# Patient Record
Sex: Female | Born: 1980 | Race: White | Hispanic: No | Smoking: Former smoker
Health system: Southern US, Community
[De-identification: ages and names within clinical notes are randomized; demographics above are authoritative.]

## PROBLEM LIST (undated history)

## (undated) DIAGNOSIS — D219 Benign neoplasm of connective and other soft tissue, unspecified: Secondary | ICD-10-CM

## (undated) DIAGNOSIS — K5900 Constipation, unspecified: Secondary | ICD-10-CM

## (undated) DIAGNOSIS — K439 Ventral hernia without obstruction or gangrene: Secondary | ICD-10-CM

## (undated) DIAGNOSIS — M2550 Pain in unspecified joint: Secondary | ICD-10-CM

## (undated) DIAGNOSIS — M549 Dorsalgia, unspecified: Secondary | ICD-10-CM

## (undated) DIAGNOSIS — G932 Benign intracranial hypertension: Secondary | ICD-10-CM

## (undated) DIAGNOSIS — M7989 Other specified soft tissue disorders: Secondary | ICD-10-CM

## (undated) DIAGNOSIS — F329 Major depressive disorder, single episode, unspecified: Secondary | ICD-10-CM

## (undated) DIAGNOSIS — E785 Hyperlipidemia, unspecified: Secondary | ICD-10-CM

## (undated) DIAGNOSIS — N809 Endometriosis, unspecified: Secondary | ICD-10-CM

## (undated) DIAGNOSIS — E559 Vitamin D deficiency, unspecified: Secondary | ICD-10-CM

## (undated) DIAGNOSIS — F32A Depression, unspecified: Secondary | ICD-10-CM

## (undated) DIAGNOSIS — E039 Hypothyroidism, unspecified: Secondary | ICD-10-CM

## (undated) DIAGNOSIS — T7840XA Allergy, unspecified, initial encounter: Secondary | ICD-10-CM

## (undated) DIAGNOSIS — I10 Essential (primary) hypertension: Secondary | ICD-10-CM

## (undated) DIAGNOSIS — F419 Anxiety disorder, unspecified: Secondary | ICD-10-CM

## (undated) DIAGNOSIS — R5383 Other fatigue: Secondary | ICD-10-CM

## (undated) DIAGNOSIS — R0602 Shortness of breath: Secondary | ICD-10-CM

## (undated) DIAGNOSIS — R748 Abnormal levels of other serum enzymes: Secondary | ICD-10-CM

## (undated) DIAGNOSIS — E038 Other specified hypothyroidism: Secondary | ICD-10-CM

## (undated) HISTORY — DX: Allergy, unspecified, initial encounter: T78.40XA

## (undated) HISTORY — DX: Constipation, unspecified: K59.00

## (undated) HISTORY — DX: Other specified hypothyroidism: E03.8

## (undated) HISTORY — DX: Other specified soft tissue disorders: M79.89

## (undated) HISTORY — DX: Anxiety disorder, unspecified: F41.9

## (undated) HISTORY — DX: Other fatigue: R53.83

## (undated) HISTORY — DX: Shortness of breath: R06.02

## (undated) HISTORY — DX: Dorsalgia, unspecified: M54.9

## (undated) HISTORY — PX: PARTIAL HYSTERECTOMY: SHX80

## (undated) HISTORY — DX: Ventral hernia without obstruction or gangrene: K43.9

## (undated) HISTORY — DX: Benign intracranial hypertension: G93.2

## (undated) HISTORY — DX: Abnormal levels of other serum enzymes: R74.8

## (undated) HISTORY — DX: Vitamin D deficiency, unspecified: E55.9

## (undated) HISTORY — DX: Endometriosis, unspecified: N80.9

## (undated) HISTORY — DX: Depression, unspecified: F32.A

## (undated) HISTORY — DX: Hypothyroidism, unspecified: E03.9

## (undated) HISTORY — DX: Pain in unspecified joint: M25.50

## (undated) HISTORY — DX: Hyperlipidemia, unspecified: E78.5

## (undated) HISTORY — DX: Essential (primary) hypertension: I10

## (undated) HISTORY — PX: ABLATION ON ENDOMETRIOSIS: SHX5787

## (undated) HISTORY — PX: ROBOTIC ASSISTED LAPAROSCOPIC PARTIAL CYSTECTOMY: SHX6480

---
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
## 2014-01-22 DIAGNOSIS — I10 Essential (primary) hypertension: Secondary | ICD-10-CM

## 2014-01-22 HISTORY — DX: Essential (primary) hypertension: I10

## 2016-01-14 DIAGNOSIS — J309 Allergic rhinitis, unspecified: Secondary | ICD-10-CM | POA: Insufficient documentation

## 2017-05-22 ENCOUNTER — Other Ambulatory Visit: Payer: Self-pay

## 2017-05-22 ENCOUNTER — Encounter: Payer: Self-pay | Admitting: Neurology

## 2017-05-22 ENCOUNTER — Ambulatory Visit (INDEPENDENT_AMBULATORY_CARE_PROVIDER_SITE_OTHER): Payer: 59 | Admitting: Neurology

## 2017-05-22 DIAGNOSIS — G932 Benign intracranial hypertension: Secondary | ICD-10-CM

## 2017-05-22 HISTORY — DX: Benign intracranial hypertension: G93.2

## 2017-05-22 MED ORDER — ACETAZOLAMIDE 250 MG PO TABS: 250.0000 mg | ORAL_TABLET | Freq: Every day | ORAL | Status: DC

## 2017-05-22 NOTE — Progress Notes (Signed)
Reason for visit: Pseudotumor cerebri  Referring physician: Dr. Chevis Pretty Erika Garrett is a 37 y.o. female  History of present illness:  Erika Garrett is a 37 year old left-handed white female with a history of obesity and pseudotumor cerebri.  Erika Garrett lives in [place], Erika diagnosis was made 3 years ago.  Erika Garrett presented with a pressure sensation in Erika head and visual loss primarily in Erika left eye.  Erika Garrett went to an ophthalmologist and was immediately sent to Erika emergency room.  MRI of Erika brain was done and she was started on Diamox taking 250 mg 4 times daily.  Erika Garrett underwent a spinal tap several weeks or months later, she had been taken off of birth control pills prior to Erika spinal tap.  Erika lumbar puncture according to Erika Garrett was unremarkable but Erika opening pressure was elevated.  Erika Garrett has been followed through ophthalmology, she has had good resolution of Erika pressure sensations in Erika head, her vision has improved.  Erika Garrett has been able to taper off of Erika Diamox taking 250 mg daily currently.  Erika Garrett had reported some tingling sensations in Erika hands and feet on Erika medication, she has not noted any other numbness, she denies any weakness.  She is bothered with some right hip discomfort.  Erika Garrett denies issues controlling Erika bowels or Erika bladder, she denies any balance issues.  She also had some double vision initially and this has resolved.  She has undergone MRI evaluation of Erika brain that was reportedly normal.  Erika medical records from [facility] are not available to me.  Erika Garrett is sent to this office for an evaluation.  She has been apparently referred to an ophthalmologist through her referring doctor, Erika Garrett has not yet heard of this appointment.  Past Medical History:  Diagnosis Date  . Endometriosis   . Hypothyroidism   . Intracranial hypertension     Past Surgical History:  Procedure Laterality Date  .  ABLATION ON ENDOMETRIOSIS    . ROBOTIC ASSISTED LAPAROSCOPIC PARTIAL CYSTECTOMY      Family History  Problem Relation Age of Onset  . Ovarian cancer Mother     Social history:  reports that she has been smoking.  She has never used smokeless tobacco. She reports that she drinks alcohol. She reports that she does not use drugs.  Medications:  Prior to Admission medications   Medication Sig Start Date End Date Taking? Authorizing Provider  Cetirizine HCl (ZYRTEC PO) Take 1 tablet by mouth daily.   Yes [provider]  Cholecalciferol (VITAMIN D PO) Take 1 tablet by mouth daily.   Yes [provider]  ibuprofen (ADVIL,MOTRIN) 200 MG tablet Take 200 mg by mouth every 6 (six) hours as needed.   Yes [provider]  LEVOTHYROXINE SODIUM PO Take by mouth.   Yes [provider]  Multiple Vitamins-Minerals (MULTIVITAMIN PO) Take 1 tablet by mouth daily.   Yes [provider]  Omega-3 Fatty Acids (FISH OIL PO) Take 1 Dose by mouth daily.   Yes [provider]  TURMERIC PO Take by mouth.   Yes [provider]  acetaZOLAMIDE (DIAMOX) 250 MG tablet Take 1 tablet (250 mg total) by mouth daily. 05/22/17   Kathrynn Ducking, MD      Allergies  Allergen Reactions  . Zithromax [Azithromycin] Hives    ROS:  Out of a complete 14 system review of symptoms, Erika Garrett  complains only of Erika following symptoms, and all other reviewed systems are negative.  Weight gain Joint pain, muscle cramps  Blood pressure 110/79, pulse (!) 53, height 5\' 6"  (1.676 m), weight 191 lb (86.6 kg).  Physical Exam  General: Erika Garrett is alert and cooperative at Erika time of Erika examination.  Erika Garrett is moderately obese.  Eyes: Pupils are equal, round, and reactive to light. Discs are flat bilaterally.  Neck: Erika neck is supple, no carotid bruits are noted.  Respiratory: Erika respiratory examination is clear.  Cardiovascular: Erika cardiovascular  examination reveals a regular rate and rhythm, no obvious murmurs or rubs are noted.  Skin: Extremities are without significant edema.  Neurologic Exam  Mental status: Erika Garrett is alert and oriented x 3 at Erika time of Erika examination. Erika Garrett has apparent normal recent and remote memory, with an apparently normal attention span and concentration ability.  Cranial nerves: Facial symmetry is present. There is good sensation of Erika face to pinprick and soft touch bilaterally. Erika strength of Erika facial muscles and Erika muscles to head turning and shoulder shrug are normal bilaterally. Speech is well enunciated, no aphasia or dysarthria is noted. Extraocular movements are full. Visual fields are full. Erika tongue is midline, and Erika Garrett has symmetric elevation of Erika soft palate. No obvious hearing deficits are noted.  Motor: Erika motor testing reveals 5 over 5 strength of all 4 extremities. Good symmetric motor tone is noted throughout.  Sensory: Sensory testing is intact to pinprick, soft touch, vibration sensation, and position sense on all 4 extremities. No evidence of extinction is noted.  Coordination: Cerebellar testing reveals good finger-nose-finger and heel-to-shin bilaterally.  Gait and station: Gait is normal. Tandem gait is normal. Romberg is negative. No drift is seen.  Reflexes: Deep tendon reflexes are symmetric and normal bilaterally. Toes are downgoing bilaterally.   Assessment/Plan:  1.  History of pseudotumor cerebri  2.  Obesity  Erika Garrett has had good improvement with Diamox therapy.  Erika Garrett likely had pseudotumor cerebri associated with Erika use of birth control pills.  She is potentially considering pregnancy in Erika near future.  Erika Garrett will continue Erika Diamox taking 250 mg daily for now, she will need an ophthalmology evaluation.  I do not see papilledema on Erika current examination, but subtle papilledema can be more easily determined through an  ophthalmologic evaluation.  If Erika papilledema has resolved, Erika Diamox can be discontinued.  Erika Garrett will follow-up in 6 months.  Jill Alexanders MD 05/22/2017 11:27 AM  [facility] [address] [place], [place] [address]  Phone [phone] Fax [phone]

## 2017-05-27 ENCOUNTER — Telehealth: Payer: Self-pay | Admitting: Neurology

## 2017-05-27 NOTE — Telephone Encounter (Signed)
   I have received medical records from her ophthalmologist in [place] from 2016.  The patient presented on 11 March with double vision, not loss of vision.  The double vision was worse with looking straight ahead.  Visual acuity at that time was 20/20 in the right eye and 20/30 -2 in the left eye.  The Diamox was started by the ophthalmologist after papilledema was seen.  Initially she was not taken off of birth control pills until she saw a neurologist.

## 2017-06-12 DIAGNOSIS — M545 Low back pain: Secondary | ICD-10-CM | POA: Diagnosis not present

## 2017-06-20 DIAGNOSIS — M79652 Pain in left thigh: Secondary | ICD-10-CM | POA: Diagnosis not present

## 2017-06-20 DIAGNOSIS — M545 Low back pain: Secondary | ICD-10-CM | POA: Diagnosis not present

## 2017-06-20 DIAGNOSIS — M25551 Pain in right hip: Secondary | ICD-10-CM | POA: Diagnosis not present

## 2017-06-20 DIAGNOSIS — M79651 Pain in right thigh: Secondary | ICD-10-CM | POA: Diagnosis not present

## 2017-06-25 ENCOUNTER — Ambulatory Visit (INDEPENDENT_AMBULATORY_CARE_PROVIDER_SITE_OTHER): Payer: 59 | Admitting: Family Medicine

## 2017-06-25 ENCOUNTER — Other Ambulatory Visit: Payer: Self-pay

## 2017-06-25 ENCOUNTER — Encounter: Payer: Self-pay | Admitting: Family Medicine

## 2017-06-25 VITALS — BP 112/80 | HR 96 | Temp 98.6°F | Resp 17 | Ht 66.25 in | Wt 194.0 lb

## 2017-06-25 DIAGNOSIS — Z7689 Persons encountering health services in other specified circumstances: Secondary | ICD-10-CM | POA: Diagnosis not present

## 2017-06-25 DIAGNOSIS — M79652 Pain in left thigh: Secondary | ICD-10-CM | POA: Diagnosis not present

## 2017-06-25 DIAGNOSIS — G932 Benign intracranial hypertension: Secondary | ICD-10-CM | POA: Diagnosis not present

## 2017-06-25 DIAGNOSIS — IMO0002 Reserved for concepts with insufficient information to code with codable children: Secondary | ICD-10-CM | POA: Insufficient documentation

## 2017-06-25 DIAGNOSIS — R252 Cramp and spasm: Secondary | ICD-10-CM | POA: Diagnosis not present

## 2017-06-25 DIAGNOSIS — M545 Low back pain: Secondary | ICD-10-CM | POA: Diagnosis not present

## 2017-06-25 DIAGNOSIS — M25551 Pain in right hip: Secondary | ICD-10-CM

## 2017-06-25 DIAGNOSIS — E669 Obesity, unspecified: Secondary | ICD-10-CM | POA: Diagnosis not present

## 2017-06-25 DIAGNOSIS — E039 Hypothyroidism, unspecified: Secondary | ICD-10-CM

## 2017-06-25 DIAGNOSIS — N803 Endometriosis of pelvic peritoneum: Secondary | ICD-10-CM | POA: Insufficient documentation

## 2017-06-25 DIAGNOSIS — M79651 Pain in right thigh: Secondary | ICD-10-CM | POA: Diagnosis not present

## 2017-06-25 DIAGNOSIS — E038 Other specified hypothyroidism: Secondary | ICD-10-CM

## 2017-06-25 MED ORDER — LEVOTHYROXINE SODIUM 50 MCG PO TABS: 50.0000 ug | ORAL_TABLET | Freq: Every day | ORAL | Status: DC

## 2017-06-25 NOTE — Patient Instructions (Addendum)
Please return at your convenience for your physical. Please come fasting.   Your imaging has been ordered.   Please go to the [place] office for your X-ray. No appointment is needed, just let them know you have an x-ray ordered and they will take care of you.  [place], [place]   Change exercise routine as we discussed, and decrease your caloric intake to 1500 calories a day.    It was a pleasure meeting you today! Thank you for choosing [name] to meet your healthcare needs! I truly look forward to working with you. If you have any questions or concerns, please send me a message via Mychart or call the office at [phone].  Muscle Cramps and Spasms Muscle cramps and spasms occur when a muscle or muscles tighten and you have no control over this tightening (involuntary muscle contraction). They are a common problem and can develop in any muscle. The most common place is in the calf muscles of the leg. Muscle cramps and muscle spasms are both involuntary muscle contractions, but there are some differences between the two:  Muscle cramps are painful. They come and go and may last a few seconds to 15 minutes. Muscle cramps are often more forceful and last longer than muscle spasms.  Muscle spasms may or may not be painful. They may also last just a few seconds or much longer.  Certain medical conditions, such as diabetes or Parkinson disease, can make it more likely to develop cramps or spasms. However, cramps or spasms are usually not caused by a serious underlying problem. Common causes include:  Overexertion.  Overuse from repetitive motions, or doing the same thing over and over.  Remaining in a certain position for a long period of time.  Improper preparation, form, or technique while playing a sport or doing an activity.  Dehydration.  Injury.  Side effects of some medicines.  Abnormally low levels of the  salts and ions in your blood (electrolytes), especially potassium and calcium. This could happen if you are taking water pills (diuretics) or if you are pregnant.  In many cases, the cause of muscle cramps or spasms is unknown. Follow these instructions at home:  Stay well hydrated. Drink enough fluid to keep your urine clear or pale yellow.  Try massaging, stretching, and relaxing the affected muscle.  If directed, apply heat to tight or tense muscles as often as told by your health care provider. Use the heat source that your health care provider recommends, such as a moist heat pack or a heating pad. ? Place a towel between your skin and the heat source. ? Leave the heat on for 20-30 minutes. ? Remove the heat if your skin turns bright red. This is especially important if you are unable to feel pain, heat, or cold. You may have a greater risk of getting burned.  If directed, put ice on the affected area. This may help if you are sore or have pain after a cramp or spasm. ? Put ice in a plastic bag. ? Place a towel between your skin and the bag. ? Leavethe ice on for 20 minutes, 2-3 times a day.  Take over-the-counter and prescription medicines only as told by your health care provider.  Pay attention to any changes in your symptoms. Contact a health care provider if:  Your cramps or spasms get more severe or happen more often.  Your cramps or spasms do  not improve over time. This information is not intended to replace advice given to you by your health care provider. Make sure you discuss any questions you have with your health care provider. Document Released: 06/30/2001 Document Revised: 02/09/2015 Document Reviewed: 10/12/2014 Elsevier Interactive Patient Education  2018 [employer].

## 2017-06-25 NOTE — Progress Notes (Signed)
Subjective  CC:  Chief Complaint  Patient presents with  . Establish Care    Moved here from [place] in January 2019    HPI: Erika Garrett is a 37 y.o. female who presents to [place] at [facility] today to establish care with me as a new patient.   She has the following concerns or needs:  We reviewed her PMH in detail. 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 Pt no longer having sxs. Will f/u with ophthamology this summer and then if all is stable, may be able to stop diamox.  Main concern is about 9-10 month h/o right sided pain thought to be related to mm cramps. Reports pain comes and goes, starts at right groin/lower abdomen and radiates around to the back mid buttock. Pain is catching and grabbing and pressing down on mm helps relieve pain. She has been seeing PT for last 4-5 months with only mild relief. No associated sxs. Pain is not brought on by mvt, position changes; it can be at rest, seated or standing. Walks daily for exercise w/o any change in sxs. No radiation down leg, no leg weakness or joint pain. No f/c/s or GI sxs. She doesn't take meds for it although she tried flexeril once and felt drugged. She is concerned about what could be causing these sxs.   Weight management: over last 2 years has gained about 25 pounds in spite of eating a healthy diet and walking for exercise most days of the week with some light weight training. Diet: 1800kcal per day.   Thyroid: started on low dose supplement for family planning reasons. Doesn't have records for review of tsh numbers over the last few years .  Assessment  1. BIH (benign intracranial hypertension)   2. Subclinical hypothyroidism   3. Muscle cramps   4. Right hip pain   5. Encounter for weight management   6. Obesity (BMI 30-39.9)      Plan   BIH/pseudotumor cerebri: per Dr. Jannifer Franklin. Stable, hopefully fully resolving. No estrogens.    Sounds like subclinical hypothryoidism; pt would prefer to not take meds unless needed. Will get endocrinology notes for review then consider stopping meds and then rechecking.   Pain: ? msk in nature. Check xrays. Continue work with PT. Ortho consult if not improving. Pt is hesitant to take any medications.  Weight mgt: rec decreasing calories and increasing exercise; discussed strategies.   Follow up:  Return for complete physical. Orders Placed This Encounter  Procedures  . DG HIPS BILAT W OR W/O PELVIS 2V  . DG Lumbar Spine Complete   Meds ordered this encounter  Medications  . levothyroxine (SYNTHROID, LEVOTHROID) 50 MCG tablet    Sig: Take 1 tablet (50 mcg total) by mouth daily before breakfast.      We updated and reviewed the patient's past history in detail and it is documented below.  Patient Active Problem List   Diagnosis Date Noted  . Endometriosis of pelvis 06/25/2017  . Subclinical hypothyroidism 06/25/2017  . Obesity (BMI 30-39.9) 06/25/2017  . 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 Allergic rhinitis 01/14/2016   Health Maintenance  Topic Date Due  . TETANUS/TDAP  08/22/2016  . HIV Screening  06/26/2018 (Originally 10/10/1994)  . INFLUENZA VACCINE  08/22/2017  . PAP SMEAR  04/22/2020    There is no immunization history on file for this patient. Current Meds  Medication Sig  .  acetaZOLAMIDE (DIAMOX) 250 MG tablet Take 1 tablet (250 mg total) by mouth daily.  . Cetirizine HCl (ZYRTEC PO) Take 1 tablet by mouth daily.  . Cholecalciferol (VITAMIN D PO) Take 1 tablet by mouth daily.  Marland Kitchen ibuprofen (ADVIL,MOTRIN) 200 MG tablet Take 200 mg by mouth every 6 (six) hours as needed.  . Multiple Vitamins-Minerals (MULTIVITAMIN PO) Take 1 tablet by mouth daily.  . Omega-3 Fatty Acids (FISH OIL PO) Take 1 Dose by mouth daily.  . TURMERIC PO Take by mouth.  . [DISCONTINUED] LEVOTHYROXINE SODIUM PO Take 0.05 mg by mouth daily.     Allergies: Patient is  allergic to estrogens and zithromax [azithromycin]. 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 Past Surgical History Patient  has a past surgical history that includes Robotic assisted laparoscopic partial cystectomy and Ablation on endometriosis. Family History: Patient family history includes Alcohol abuse in her father; Alzheimer's disease in her maternal grandmother; Cancer in her maternal grandmother; Endocrine tumor in her father; Healthy in her sister; Hemachromatosis in her paternal grandmother; Ovarian cancer in her mother. Social History:  Patient  reports that she has been smoking.  She has never used smokeless tobacco. She reports that she drinks alcohol. She reports that she does not use drugs.  Review of Systems: Constitutional: negative for fever or malaise Ophthalmic: negative for photophobia, double vision or loss of vision Cardiovascular: negative for chest pain, dyspnea on exertion, or new LE swelling Respiratory: negative for SOB or persistent cough Gastrointestinal: negative for abdominal pain, change in bowel habits or melena Genitourinary: negative for dysuria or gross hematuria Musculoskeletal: negative for new gait disturbance or muscular weakness Integumentary: negative for new or persistent rashes Neurological: negative for TIA or stroke symptoms Psychiatric: negative for SI or delusions Allergic/Immunologic: negative for hives  Patient Care Team    Relationship Specialty Notifications Start End  Leamon Arnt, MD PCP - General Family Medicine  06/25/17   Charyl Bigger, MD Consulting Physician Obstetrics and Gynecology  05/22/17   Kathrynn Ducking, MD Consulting Physician Neurology  06/25/17   Dr. Oren Binet  Dentistry  06/25/17   Opthamology, [facility]  Ophthalmology  06/25/17     Objective  Vitals: BP  112/80   Pulse 96   Temp 98.6 F (37 C) (Oral)   Resp 17   Ht 5' 6.25" (1.683 m)   Wt 194 lb (88 kg)   LMP 06/10/2017   SpO2 98%   BMI 31.08 kg/m  General:  Well developed, well nourished, no acute distress  Psych:  Alert and oriented,normal mood and affect HEENT:  Normocephalic, atraumatic, non-icteric sclera, PERRL, oropharynx is without mass or exudate, supple neck without adenopathy, mass or thyromegaly Cardiovascular:  RRR without gallop, rub or murmur, nondisplaced PMI Respiratory:  Good breath sounds bilaterally, CTAB with normal respiratory effort Gastrointestinal: normal bowel sounds, soft, non-tender, no noted masses. No HSM MSK: no deformities, contusions. Joints are without erythema or swelling, Bilateral hips FROM w/o groin ttp, no ttp over gr troch bursa. Nl back flexion and extension w/o pain.  Skin:  Warm, no rashes or suspicious lesions noted Neurologic:    Mental status is normal. Gross motor and sensory exams are normal. Normal gait   Commons side effects, risks, benefits, and alternatives for medications and treatment plan prescribed today were discussed, and the patient expressed understanding of the given instructions. Patient is instructed to call  or message via MyChart if he/she has any questions or concerns regarding our treatment plan. No barriers to understanding were identified. We discussed Red Flag symptoms and signs in detail. Patient expressed understanding regarding what to do in case of urgent or emergency type symptoms.   Medication list was reconciled, printed and provided to the patient in AVS. Patient instructions and summary information was reviewed with the patient as documented in the AVS. This note was prepared with assistance of Dragon voice recognition software. Occasional wrong-word or sound-a-like substitutions may have occurred due to the inherent limitations of voice recognition software

## 2017-07-02 DIAGNOSIS — M79652 Pain in left thigh: Secondary | ICD-10-CM | POA: Diagnosis not present

## 2017-07-02 DIAGNOSIS — M79651 Pain in right thigh: Secondary | ICD-10-CM | POA: Diagnosis not present

## 2017-07-02 DIAGNOSIS — M545 Low back pain: Secondary | ICD-10-CM | POA: Diagnosis not present

## 2017-07-02 DIAGNOSIS — M25551 Pain in right hip: Secondary | ICD-10-CM | POA: Diagnosis not present

## 2017-07-09 DIAGNOSIS — M545 Low back pain: Secondary | ICD-10-CM | POA: Diagnosis not present

## 2017-07-09 DIAGNOSIS — M79652 Pain in left thigh: Secondary | ICD-10-CM | POA: Diagnosis not present

## 2017-07-09 DIAGNOSIS — M79651 Pain in right thigh: Secondary | ICD-10-CM | POA: Diagnosis not present

## 2017-07-09 DIAGNOSIS — M25551 Pain in right hip: Secondary | ICD-10-CM | POA: Diagnosis not present

## 2017-07-11 ENCOUNTER — Ambulatory Visit (INDEPENDENT_AMBULATORY_CARE_PROVIDER_SITE_OTHER): Payer: 59 | Admitting: Family Medicine

## 2017-07-11 ENCOUNTER — Other Ambulatory Visit: Payer: Self-pay

## 2017-07-11 VITALS — BP 112/62 | HR 70 | Temp 98.1°F | Ht 66.25 in | Wt 190.0 lb

## 2017-07-11 DIAGNOSIS — Z23 Encounter for immunization: Secondary | ICD-10-CM

## 2017-07-11 DIAGNOSIS — E039 Hypothyroidism, unspecified: Secondary | ICD-10-CM

## 2017-07-11 DIAGNOSIS — G932 Benign intracranial hypertension: Secondary | ICD-10-CM | POA: Diagnosis not present

## 2017-07-11 DIAGNOSIS — Z0000 Encounter for general adult medical examination without abnormal findings: Secondary | ICD-10-CM

## 2017-07-11 DIAGNOSIS — M25551 Pain in right hip: Secondary | ICD-10-CM

## 2017-07-11 DIAGNOSIS — E038 Other specified hypothyroidism: Secondary | ICD-10-CM

## 2017-07-11 LAB — COMPREHENSIVE METABOLIC PANEL
ALT: 21 U/L (ref 0–35)
AST: 17 U/L (ref 0–37)
Albumin: 4.5 g/dL (ref 3.5–5.2)
Alkaline Phosphatase: 68 U/L (ref 39–117)
BUN: 23 mg/dL (ref 6–23)
CO2: 24 mEq/L (ref 19–32)
Calcium: 9.5 mg/dL (ref 8.4–10.5)
Chloride: 107 mEq/L (ref 96–112)
Creatinine, Ser: 0.79 mg/dL (ref 0.40–1.20)
GFR: 86.68 mL/min (ref >60.00)
Glucose, Bld: 99 mg/dL (ref 70–99)
Potassium: 3.9 mEq/L (ref 3.5–5.1)
Sodium: 139 mEq/L (ref 135–145)
Total Bilirubin: 0.4 mg/dL (ref 0.2–1.2)
Total Protein: 6.6 g/dL (ref 6.0–8.3)

## 2017-07-11 LAB — CBC WITH DIFFERENTIAL/PLATELET
Basophils Absolute: 0 10*3/uL (ref 0.0–0.1)
Basophils Relative: 0.3 % (ref 0.0–3.0)
Eosinophils Absolute: 0.2 10*3/uL (ref 0.0–0.7)
Eosinophils Relative: 2.3 % (ref 0.0–5.0)
HCT: 41.4 % (ref 36.0–46.0)
Hemoglobin: 14.2 g/dL (ref 12.0–15.0)
Lymphocytes Relative: 32.4 % (ref 12.0–46.0)
Lymphs Abs: 2.5 10*3/uL (ref 0.7–4.0)
MCHC: 34.2 g/dL (ref 30.0–36.0)
MCV: 92.9 fl (ref 78.0–100.0)
Monocytes Absolute: 0.4 10*3/uL (ref 0.1–1.0)
Monocytes Relative: 5.4 % (ref 3.0–12.0)
Neutro Abs: 4.7 10*3/uL (ref 1.4–7.7)
Neutrophils Relative %: 59.6 % (ref 43.0–77.0)
Platelets: 245 10*3/uL (ref 150.0–400.0)
RBC: 4.46 Mil/uL (ref 3.87–5.11)
RDW: 12.7 % (ref 11.5–15.5)
WBC: 7.8 10*3/uL (ref 4.0–10.5)

## 2017-07-11 LAB — LIPID PANEL
Cholesterol: 203 mg/dL — ABNORMAL HIGH (ref 0–200)
HDL: 35 mg/dL — ABNORMAL LOW (ref >39.00)
LDL Cholesterol: 137 mg/dL — ABNORMAL HIGH (ref 0–99)
NonHDL: 167.84
Total CHOL/HDL Ratio: 6
Triglycerides: 155 mg/dL — ABNORMAL HIGH (ref 0.0–149.0)
VLDL: 31 mg/dL (ref 0.0–40.0)

## 2017-07-11 NOTE — Patient Instructions (Signed)
Please return in 12 months for your annual complete physical; please come fasting.  Please sign up for my chart.   I will release your lab results to you on your MyChart account with further instructions. Please reply with any questions.    If you have any questions or concerns, please don't hesitate to send me a message via MyChart or call the office at [phone]. Thank you for visiting with [name] today! It's our pleasure caring for you.  Please do these things to maintain good health!   Exercise at least 30-45 minutes a day,  4-5 days a week.   Eat a low-fat diet with lots of fruits and vegetables, up to 7-9 servings per day.  Drink plenty of water daily. Try to drink 8 8oz glasses per day.  Seatbelts can save your life. Always wear your seatbelt.  Place Smoke Detectors on every level of your home and check batteries every year.  Schedule an appointment with an eye doctor for an eye exam every 1-2 years  Safe sex - use condoms to protect yourself from STDs if you could be exposed to these types of infections. Use birth control if you do not want to become pregnant and are sexually active.  Avoid heavy alcohol use. If you drink, keep it to less than 2 drinks/day and not every day.  [place].  Choose someone you trust that could speak for you if you became unable to speak for yourself.  Depression is common in our stressful world.If you're feeling down or losing interest in things you normally enjoy, please come in for a visit.  If anyone is threatening or hurting you, please get help. Physical or Emotional Violence is never OK.    Calcium Intake Recommendations You can take Caltrate Plus twice a day or get it through your diet or other OTC supplements (Viactiv, OsCal etc)  Calcium is a mineral that affects many functions in the body, including:  Blood clotting.  Blood vessel function.  Nerve impulse conduction.  Hormone secretion.  Muscle  contraction.  Bone and teeth functions.  Most of your body's calcium supply is stored in your bones and teeth. When your calcium stores are low, you may be at risk for low bone mass, bone loss, and bone fractures. Consuming enough calcium helps to grow healthy bones and teeth and to prevent breakdown over time. It is very important that you get enough calcium if you are:  A child undergoing rapid growth.  An adolescent girl.  A pre- or post-menopausal woman.  A woman whose menstrual cycle has stopped due to anorexia nervosa or regular intense exercise.  An individual with lactose intolerance or a milk allergy.  A vegetarian.  What is my plan? Try to consume the recommended amount of calcium daily based on your age. Depending on your overall health, your health care provider may recommend increased calcium intake.General daily calcium intake recommendations by age are:  Birth to 6 months: 200 mg.  Infants 7 to 12 months: 260 mg.  Children 1 to 3 years: 700 mg.  Children 4 to 8 years: 1,000 mg.  Children 9 to 13 years: 1,300 mg.  Teens 14 to 18 years: 1,300 mg.  Adults 19 to 50 years: 1,000 mg.  Adult women 51 to 70 years: 1,200 mg.  Adult men 51 to 70 years: 1,000 mg.  Adults 71 years and older: 1,200 mg.  Pregnant and breastfeeding teens: 1,300 mg.  Pregnant and breastfeeding adults:  1,000 mg.  What do I need to know about calcium intake?  In order for the body to absorb calcium, it needs vitamin D. You can get vitamin D through (we recommend getting [phone] units of Vitamin D daily) ? Direct exposure of the skin to sunlight. ? Foods, such as egg yolks, liver, saltwater fish, and fortified milk. ? Supplements.  Consuming too much calcium may cause: ? Constipation. ? Decreased absorption of iron and zinc. ? Kidney stones.  Calcium supplements may interact with certain medicines. Check with your health care provider before starting any calcium  supplements.  Try to get most of your calcium from food. What foods can I eat? Grains  Fortified oatmeal. Fortified ready-to-eat cereals. Fortified frozen waffles. Vegetables Turnip greens. Broccoli. Fruits Fortified orange juice. Meats and Other Protein Sources Canned sardines with bones. Canned salmon with bones. Soy beans. Tofu. Baked beans. Almonds. [place] nuts. Sunflower seeds. Dairy Milk. Yogurt. Cheese. Cottage cheese. Beverages Fortified soy milk. Fortified rice milk. Sweets/Desserts Pudding. Ice Cream. Milkshakes. Blackstrap molasses. The items listed above may not be a complete list of recommended foods or beverages. Contact your dietitian for more options. What foods can affect my calcium intake? It may be more difficult for your body to use calcium or calcium may leave your body more quickly if you consume large amounts of:  Sodium.  Protein.  Caffeine.  Alcohol.  This information is not intended to replace advice given to you by your health care provider. Make sure you discuss any questions you have with your health care provider. Document Released: 08/23/2003 Document Revised: 07/29/2015 Document Reviewed: 06/16/2013 Elsevier Interactive Patient Education  2018 [employer].

## 2017-07-11 NOTE — Progress Notes (Signed)
Subjective  Chief Complaint  Patient presents with  . Annual Exam    no complaints, doing well, due for tdap, patient is fasting     HPI: Erika Garrett is a 37 y.o. female who presents to [place] [facility] today for a Female Wellness Visit.   Wellness Visit: annual visit with health maintenance review and exam without Pap   See last note for recent health summary.   Doing better: has changed diet and now is losing weight. Feels good about this.   HM: due for Tdap. Pap up to date.  Thyroid: will check levels on levothyroxine supplementation. Energy is good. No sxs of low thyroid.   Hip/groin pain: now better after dry needling of quad. Didn't get xrays.   Assessment  1. Annual physical exam   2. Need for Tdap vaccination   3. Subclinical hypothyroidism   4. 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 Right hip pain      Plan  Female Wellness Visit:  Age appropriate Health Maintenance and Prevention measures were discussed with patient. Included topics are cancer screening recommendations, ways to keep healthy (see AVS) including dietary and exercise recommendations, regular eye and dental care, use of seat belts, and avoidance of moderate alcohol use and tobacco use.   BMI: discussed patient's BMI and encouraged positive lifestyle modifications to help get to or maintain a target BMI.  HM needs and immunizations were addressed and ordered. See below for orders. See HM and immunization section for updates. Tdap updated today.  Routine labs and screening tests ordered including cmp, cbc and lipids where appropriate.  Discussed recommendations regarding Vit D and calcium supplementation (see AVS)  Recheck tsh on meds; if too low, stop and then recheck panel in 8 weeks. If 1-2, can continue.   Hip pain is improving with dry needling. Will get xrays if it returns only.   Follow up: Return in about 1 year (around 07/12/2018) for complete physical.    Orders Placed This Encounter  Procedures  . Tdap vaccine greater than or equal to 7yo IM  . CBC with Differential/Platelet  . Comprehensive metabolic panel  . Lipid panel  . VITAMIN D 25 Hydroxy (Vit-D Deficiency, Fractures)   No orders of the defined types were placed in this encounter.     Lifestyle: Body mass index is 30.44 kg/m. Wt Readings from Last 3 Encounters:  07/11/17 190 lb (86.2 kg)  06/25/17 194 lb (88 kg)  05/22/17 191 lb (86.6 kg)   Diet: low fat Exercise: frequently,  Need for contraception: Yes, condoms  Patient Active Problem List   Diagnosis Date Noted  . Endometriosis of pelvis 06/25/2017  . Subclinical hypothyroidism 06/25/2017  . Obesity (BMI 30-39.9) 06/25/2017  . 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 Allergic rhinitis 01/14/2016   Health Maintenance  Topic Date Due  . TETANUS/TDAP  08/22/2016  . HIV Screening  06/26/2018 (Originally 10/10/1994)  . INFLUENZA VACCINE  08/22/2017  . PAP SMEAR  04/22/2020   There is no immunization history for the selected administration types on file for this patient. We updated and reviewed the patient's past history in detail and it is documented below. Allergies: Patient is allergic to estrogens and zithromax [azithromycin]. 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 Past Surgical History Patient  has a past surgical history that includes Robotic assisted laparoscopic partial cystectomy  and Ablation on endometriosis. Family History: Patient family history includes Alcohol abuse in her father; Alzheimer's disease in her maternal grandmother; Cancer in her maternal grandmother; Endocrine tumor in her father; Healthy in her sister; Hemachromatosis in her paternal grandmother; Ovarian cancer in her mother. Social History:  Patient  reports  that she has been smoking.  She has never used smokeless tobacco. She reports that she drinks alcohol. She reports that she does not use drugs.  Review of Systems: Constitutional: negative for fever or malaise Ophthalmic: negative for photophobia, double vision or loss of vision Cardiovascular: negative for chest pain, dyspnea on exertion, or new LE swelling Respiratory: negative for SOB or persistent cough Gastrointestinal: negative for abdominal pain, change in bowel habits or melena Genitourinary: negative for dysuria or gross hematuria, no abnormal uterine bleeding or disharge Musculoskeletal: negative for new gait disturbance or muscular weakness Integumentary: negative for new or persistent rashes, no breast lumps Neurological: negative for TIA or stroke symptoms Psychiatric: negative for SI or delusions Allergic/Immunologic: negative for hives Patient Care Team    Relationship Specialty Notifications Start End  Leamon Arnt, MD PCP - General Family Medicine  06/25/17   Charyl Bigger, MD Consulting Physician Obstetrics and Gynecology  05/22/17   Kathrynn Ducking, MD Consulting Physician Neurology  06/25/17   Dr. Oren Binet  Dentistry  06/25/17   Opthamology, [facility]  Ophthalmology  06/25/17     Objective  Vitals: BP 112/62   Pulse 70   Temp 98.1 F (36.7 C)   Ht 5' 6.25" (1.683 m)   Wt 190 lb (86.2 kg)   SpO2 97%   BMI 30.44 kg/m  General:  Well developed, well nourished, no acute distress  Psych:  Alert and orientedx3,normal mood and affect HEENT:  Normocephalic, atraumatic, non-icteric sclera, PERRL, oropharynx is clear without mass or exudate, supple neck without adenopathy, mass or thyromegaly Cardiovascular:  Normal S1, S2, RRR without gallop, rub or murmur, nondisplaced PMI Respiratory:  Good breath sounds bilaterally, CTAB with normal respiratory effort Gastrointestinal: normal bowel sounds, soft, non-tender, no noted masses. No HSM MSK: no deformities, contusions.  Joints are without erythema or swelling. Spine and CVA region are nontender Skin:  Warm, no rashes or suspicious lesions noted Neurologic:    Mental status is normal. CN 2-11 are normal. Gross motor and sensory exams are normal. Normal gait. No tremor Breast Exam: No mass, skin retraction or nipple discharge is appreciated in either breast. No axillary adenopathy. Fibrocystic changes are not noted   Commons side effects, risks, benefits, and alternatives for medications and treatment plan prescribed today were discussed, and the patient expressed understanding of the given instructions. Patient is instructed to call or message via MyChart if he/she has any questions or concerns regarding our treatment plan. No barriers to understanding were identified. We discussed Red Flag symptoms and signs in detail. Patient expressed understanding regarding what to do in case of urgent or emergency type symptoms.   Medication list was reconciled, printed and provided to the patient in AVS. Patient instructions and summary information was reviewed with the patient as documented in the AVS. This note was prepared with assistance of Dragon voice recognition software. Occasional wrong-word or sound-a-like substitutions may have occurred due to the inherent limitations of voice recognition software

## 2017-07-12 ENCOUNTER — Other Ambulatory Visit (INDEPENDENT_AMBULATORY_CARE_PROVIDER_SITE_OTHER): Payer: 59

## 2017-07-12 DIAGNOSIS — Z0000 Encounter for general adult medical examination without abnormal findings: Secondary | ICD-10-CM

## 2017-07-12 LAB — VITAMIN D 25 HYDROXY (VIT D DEFICIENCY, FRACTURES): VITD: 37.29 ng/mL (ref 30.00–100.00)

## 2017-07-15 ENCOUNTER — Encounter: Payer: Self-pay | Admitting: Family Medicine

## 2017-07-15 LAB — TSH: TSH: 1.98 u[IU]/mL (ref 0.35–4.50)

## 2017-07-15 MED ORDER — LEVOTHYROXINE SODIUM 50 MCG PO TABS: 50.0000 ug | ORAL_TABLET | Freq: Every day | ORAL | 3 refills | Status: DC

## 2017-07-16 DIAGNOSIS — M79651 Pain in right thigh: Secondary | ICD-10-CM | POA: Diagnosis not present

## 2017-07-16 DIAGNOSIS — M79652 Pain in left thigh: Secondary | ICD-10-CM | POA: Diagnosis not present

## 2017-07-16 DIAGNOSIS — M545 Low back pain: Secondary | ICD-10-CM | POA: Diagnosis not present

## 2017-07-16 DIAGNOSIS — M25551 Pain in right hip: Secondary | ICD-10-CM | POA: Diagnosis not present

## 2017-07-29 ENCOUNTER — Telehealth: Payer: Self-pay | Admitting: Neurology

## 2017-07-29 ENCOUNTER — Other Ambulatory Visit: Payer: Self-pay | Admitting: Neurology

## 2017-07-29 MED ORDER — ACETAZOLAMIDE 250 MG PO TABS: 250.0000 mg | ORAL_TABLET | Freq: Every day | ORAL | 1 refills | Status: DC

## 2017-07-29 NOTE — Telephone Encounter (Addendum)
Pt request refill for acetaZOLAMIDE (DIAMOX) 250 MG tablet sent to [facility]. Pt has been out of the medication for 1-1/2 weeks. [facility] was trying to contact her old physician office. Pt is wanting to know if she still needs to take the medication. Please call to discuss. Also eye is appt is in 8/13 with Dr Valetta Close.

## 2017-07-29 NOTE — Addendum Note (Signed)
Addended by: Hope Pigeon on: 07/29/2017 01:55 PM   Modules accepted: Orders

## 2017-07-29 NOTE — Telephone Encounter (Signed)
Per Dr. Krista Blue pt should restart diamox 250mg  daily until seen by Dr. Valetta Close (ophthalmology) on 09/03/17 to determine if papilledema has resolved. Per. Dr. Jannifer Franklin' last note, if resolved she can discontinue diamox. Sent in rx to pharmacy as requested

## 2017-07-29 NOTE — Telephone Encounter (Signed)
I called pt back. Advised I sent in rx for diamox 250mg  daily. Per Dr. Krista Blue, ok for her to restart. She needs to keep appt for 09/03/17 with ophthalmology. They should have notes forwarded to our office. She should keep f/u on 11/22/17 with Dr. Jannifer Franklin. She verbalized understanding and appreciation.

## 2017-08-12 ENCOUNTER — Ambulatory Visit: Payer: 59 | Admitting: Family Medicine

## 2017-08-12 DIAGNOSIS — Z0289 Encounter for other administrative examinations: Secondary | ICD-10-CM

## 2017-08-13 ENCOUNTER — Other Ambulatory Visit: Payer: Self-pay

## 2017-08-13 ENCOUNTER — Ambulatory Visit (INDEPENDENT_AMBULATORY_CARE_PROVIDER_SITE_OTHER): Payer: 59 | Admitting: Family Medicine

## 2017-08-13 ENCOUNTER — Encounter: Payer: Self-pay | Admitting: Family Medicine

## 2017-08-13 VITALS — BP 100/72 | HR 90 | Temp 98.8°F | Wt 191.6 lb

## 2017-08-13 DIAGNOSIS — M25551 Pain in right hip: Secondary | ICD-10-CM | POA: Diagnosis not present

## 2017-08-13 DIAGNOSIS — R102 Pelvic and perineal pain: Secondary | ICD-10-CM

## 2017-08-13 DIAGNOSIS — R1031 Right lower quadrant pain: Secondary | ICD-10-CM

## 2017-08-13 NOTE — Patient Instructions (Signed)
Please return after your referral appointments.   We will call you with information regarding your referral appointment. Abdomen and pelvic CT; orthopedics and Gynecology.  If you do not hear from [name] within the next 2 weeks, please let me know. It can take 1-2 weeks to get appointments set up with the specialists.    If you have any questions or concerns, please don't hesitate to send me a message via MyChart or call the office at [phone]. Thank you for visiting with [name] today! It's our pleasure caring for you.

## 2017-08-13 NOTE — Progress Notes (Signed)
Subjective  CC:  Chief Complaint  Patient presents with  . Abdominal Pain    x 1 year, Right Lower Pain, radiating to back and down right leg     HPI: Erika Garrett is a 37 y.o. female who presents to the office today to address the problems listed above in the chief complaint.  Please refer to note of OV  07/15/2017  Patient has approximately 1-month history of right lower quadrant/hip pain.  This was detailed in the last 2 office visits.  She believed it was related to muscle cramps.  Physical therapy worked with her and seemed to improve her pain, especially after dry needling.  Since stopping however pain is returning.  She now describes pain in right lower quadrant, sharp persistent for days and mildly relieved with Advil.  Not associated with eating or movement changes.  She has history of endometriosis, reportedly a small fibroid, denies history of IBS or GI related symptoms or back pain.  Pain at times radiates to the right buttocks and occasionally down her leg.  No weakness.  Assessment  1. Right lower quadrant abdominal pain   2. Right hip pain   3. Pelvic pain in female      Plan   Right lower quadrant abdominal/pelvic pain: Broad differential.  Patient is highly frustrated and concerned about the etiology.  Recommend a 3 prong approach: Orthopedic referral to assess for musculoskeletal etiologies including lumbar spine disease, gynecology for second opinion regarding possible endometriosis, abdominal pelvic CT scan to help clarify any other pathology.  Patient will follow-up with me after the studies and referrals have been made.  No red flag symptoms today.  Follow up: Return if symptoms worsen or fail to improve.   Orders Placed This Encounter  Procedures  . CT Abdomen Pelvis W Contrast  . Ambulatory referral to Obstetrics / Gynecology  . AMB referral to orthopedics   No orders of the defined types were placed in this encounter.     I reviewed the patients  updated PMH, FH, and SocHx.    Patient Active Problem List   Diagnosis Date Noted  . Endometriosis of pelvis 06/25/2017  . Subclinical hypothyroidism 06/25/2017  . Obesity (BMI 30-39.9) 06/25/2017  . 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 Allergic rhinitis 01/14/2016   Current Meds  Medication Sig  . acetaZOLAMIDE (DIAMOX) 250 MG tablet Take 1 tablet (250 mg total) by mouth daily.  . Cetirizine HCl (ZYRTEC PO) Take 1 tablet by mouth daily.  . Cholecalciferol (VITAMIN D PO) Take 1 tablet by mouth daily.  Marland Kitchen ibuprofen (ADVIL,MOTRIN) 200 MG tablet Take 200 mg by mouth every 6 (six) hours as needed.  Marland Kitchen levothyroxine (SYNTHROID, LEVOTHROID) 50 MCG tablet Take 1 tablet (50 mcg total) by mouth daily before breakfast.  . Multiple Vitamins-Minerals (MULTIVITAMIN PO) Take 1 tablet by mouth daily.  . Omega-3 Fatty Acids (FISH OIL PO) Take 1 Dose by mouth daily.  . [DISCONTINUED] TURMERIC PO Take by mouth.    Allergies: Patient is allergic to estrogens and zithromax [azithromycin]. Family History: Patient family history includes Alcohol abuse in her father; Alzheimer's disease in her maternal grandmother; Cancer in her maternal grandmother; Endocrine tumor in her father; Healthy in her sister; Hemachromatosis in her paternal grandmother; Ovarian cancer in her mother. Social History:  Patient  reports that she has been smoking.  She has never used smokeless tobacco. She reports that she drinks alcohol. She reports that she does not use drugs.  Review  of Systems: Constitutional: Negative for fever malaise or anorexia Cardiovascular: negative for chest pain Respiratory: negative for SOB or persistent cough Gastrointestinal: negative for n/v/d or constipation pain  Objective  Vitals: BP 100/72   Pulse 90   Temp 98.8 F (37.1 C)   Wt 191 lb 9.6 oz (86.9 kg)   SpO2 99%   BMI 30.69 kg/m  General: no acute distress , A&Ox3 HEENT: PEERL, conjunctiva normal, Oropharynx moist,neck is  supple Cardiovascular:  RRR without murmur or gallop.  Respiratory:  Good breath sounds bilaterally, CTAB with normal respiratory effort Skin:  Warm, no rashes Gastrointestinal: soft, flat abdomen, normal active bowel sounds, no palpable masses, no hepatosplenomegaly, no appreciated hernias  No visits with results within 1 Day(s) from this visit.  Latest known visit with results is:  Lab on 07/12/2017  Component Date Value Ref Range Status  . TSH 07/12/2017 1.98  0.35 - 4.50 uIU/mL Final   Lab Results  Component Value Date   WBC 7.8 07/11/2017   HGB 14.2 07/11/2017   HCT 41.4 07/11/2017   MCV 92.9 07/11/2017   PLT 245.0 07/11/2017   Lab Results  Component Value Date   CREATININE 0.79 07/11/2017   BUN 23 07/11/2017   NA 139 07/11/2017   K 3.9 07/11/2017   CL 107 07/11/2017   CO2 24 07/11/2017       Commons side effects, risks, benefits, and alternatives for medications and treatment plan prescribed today were discussed, and the patient expressed understanding of the given instructions. Patient is instructed to call or message via MyChart if he/she has any questions or concerns regarding our treatment plan. No barriers to understanding were identified. We discussed Red Flag symptoms and signs in detail. Patient expressed understanding regarding what to do in case of urgent or emergency type symptoms.   Medication list was reconciled, printed and provided to the patient in AVS. Patient instructions and summary information was reviewed with the patient as documented in the AVS. This note was prepared with assistance of Dragon voice recognition software. Occasional wrong-word or sound-a-like substitutions may have occurred due to the inherent limitations of voice recognition software

## 2017-08-19 DIAGNOSIS — R1031 Right lower quadrant pain: Secondary | ICD-10-CM | POA: Diagnosis not present

## 2017-08-19 DIAGNOSIS — N809 Endometriosis, unspecified: Secondary | ICD-10-CM | POA: Diagnosis not present

## 2017-09-03 DIAGNOSIS — G932 Benign intracranial hypertension: Secondary | ICD-10-CM | POA: Diagnosis not present

## 2017-09-03 DIAGNOSIS — H5203 Hypermetropia, bilateral: Secondary | ICD-10-CM | POA: Diagnosis not present

## 2017-09-06 ENCOUNTER — Ambulatory Visit (INDEPENDENT_AMBULATORY_CARE_PROVIDER_SITE_OTHER): Payer: 59 | Admitting: Orthopaedic Surgery

## 2017-09-06 ENCOUNTER — Telehealth: Payer: Self-pay | Admitting: Family Medicine

## 2017-09-06 ENCOUNTER — Ambulatory Visit
Admission: RE | Admit: 2017-09-06 | Discharge: 2017-09-06 | Disposition: A | Discharge status: Home / Self Care | Payer: 59 | Source: Ambulatory Visit | Attending: Family Medicine | Admitting: Family Medicine

## 2017-09-06 ENCOUNTER — Ambulatory Visit (INDEPENDENT_AMBULATORY_CARE_PROVIDER_SITE_OTHER): Payer: Self-pay

## 2017-09-06 ENCOUNTER — Encounter (INDEPENDENT_AMBULATORY_CARE_PROVIDER_SITE_OTHER): Payer: Self-pay | Admitting: Orthopaedic Surgery

## 2017-09-06 VITALS — BP 121/89 | HR 79 | Ht 66.0 in | Wt 190.0 lb

## 2017-09-06 DIAGNOSIS — R1031 Right lower quadrant pain: Secondary | ICD-10-CM

## 2017-09-06 DIAGNOSIS — M25551 Pain in right hip: Secondary | ICD-10-CM | POA: Diagnosis not present

## 2017-09-06 DIAGNOSIS — K439 Ventral hernia without obstruction or gangrene: Secondary | ICD-10-CM | POA: Diagnosis not present

## 2017-09-06 MED ORDER — IOPAMIDOL (ISOVUE-300) INJECTION 61%
100.0000 mL | Freq: Once | INTRAVENOUS | Status: AC | PRN
  Administered 2017-09-06: 100 mL via INTRAVENOUS

## 2017-09-06 NOTE — Progress Notes (Signed)
Office Visit Note   Patient: Erika Garrett           Date of Birth: 1981/01/12           MRN: [id number] Visit Date: 09/06/2017              Requested by: Leamon Arnt, MD [address] [name] [address] [place], [place] [address] PCP: Leamon Arnt, MD   Assessment & Plan: Visit Diagnoses:  1. Pain in right hip     Plan: Chronic pain right hip and groin.  Multiple diagnostic possibilities.  Erika Garrett is in the midst of a work-up with a CT scan with contrast per her primary care physician.  Think that is an important study to rule out other possibilities other than muscular  skeletal etiology.  We will see back after the above studies.  There is a possibility thinks she could have impingement syndrome or tear of her right hip.  I do not think this is referred from her lumbar spine  Follow-Up Instructions: Return if symptoms worsen or fail to improve.   Orders:  Orders Placed This Encounter  Procedures  . XR HIP UNILAT W OR W/O PELVIS 2-3 VIEWS RIGHT   No orders of the defined types were placed in this encounter.     Procedures: No procedures performed   Clinical Data: No additional findings.   Subjective: Chief Complaint  Patient presents with  . Follow-up    R HIP PAIN FOR OVER 1 YR, NO INJURY. FROM [place] HAS BEEN REFERRED TO THERAPY, HELPED FOR A FEW DAYS BUT PAIN COMES BACK. PAIN RUNS DOWN FRONT OF LEG AND HURTS IN GROIN, DOSE YOGA EXERCISES TO HELP PCP HAS REFERRED HERE AND ORDERED CT FOR 09/19/17 HAVING 2ND VAGINAL [name] SCHEDULED 09/16/17 HAS HX OF ENDROMETROSIS HAS ABDOMINAL BLOATING. BUT PAIN IS DIFFERENT THIS TIME  Erika Garrett is 37 years old and visits the office with a chronic history of right groin pain. HPI  Review of Systems  Constitutional: Negative for fatigue and fever.  HENT: Negative for ear pain.   Eyes: Negative for pain.  Respiratory: Negative for cough and shortness of breath.   Cardiovascular: Negative for leg swelling.  Gastrointestinal: Positive for  constipation and diarrhea.  Genitourinary: Negative for difficulty urinating.  Musculoskeletal: Negative for back pain and neck pain.  Skin: Negative for rash.  Allergic/Immunologic: Negative for food allergies.  Neurological: Positive for weakness. Negative for numbness.  Psychiatric/Behavioral: Positive for sleep disturbance.     Objective: Vital Signs: BP 121/89 (BP Location: Left Arm, Patient Position: Sitting, Cuff Size: Normal)   Pulse 79   Ht 5\' 6"  (1.676 m)   Wt 190 lb (86.2 kg)   BMI 30.67 kg/m   Physical Exam  Ortho Exam  Specialty Comments:  No specialty comments available.  Imaging: Xr Hip Unilat W Or W/o Pelvis 2-3 Views Right  Result Date: 09/06/2017 AP the pelvis and lateral of the right hip were performed.  There is no obvious degenerative change in the right hip where the patient is symptomatic.  There is some additional calcification of the lateral acetabulum.  Early could cause some impingement.  Remainder the bony pelvis appears to be intact    PMFS History: Patient Active Problem List   Diagnosis Date Noted  . Endometriosis of pelvis 06/25/2017  . Subclinical hypothyroidism 06/25/2017  . Obesity (BMI 30-39.9) 06/25/2017  . 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 Allergic rhinitis 01/14/2016   Past Medical History:  Diagnosis Date  . Endometriosis   . Hypertension 2016   Intercranial Hypertension  . Intracranial hypertension   . 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 Subclinical hypothyroidism     Family History  Problem Relation Age of Onset  . Ovarian cancer Mother   . Endocrine tumor Father   . Alcohol abuse Father   . Healthy Sister   . Alzheimer's disease Maternal Grandmother   . Cancer Maternal Grandmother   . Hemachromatosis Paternal Grandmother     Past Surgical History:  Procedure Laterality Date  . ABLATION ON ENDOMETRIOSIS    . ROBOTIC ASSISTED LAPAROSCOPIC PARTIAL CYSTECTOMY     Social History    Occupational History  . Not on file  Tobacco Use  . Smoking status: Current Some Day Smoker  . Smokeless tobacco: Never Used  . Tobacco comment: socially  Substance and Sexual Activity  . Alcohol use: Yes    Comment: occasional  . Drug use: Never  . Sexual activity: Yes    Birth control/protection: Condom

## 2017-09-06 NOTE — Telephone Encounter (Signed)
Copied from [place] [id number]. Topic: General - Other >> Sep 06, 2017 12:27 PM Burchel, Abbi R wrote: Reason for CRM:   Pt wanted to let Dr Jonni Sanger know that she had a CT done today (it was originally scheduled for he 29th, but they were able to get her in earlier) and she would like to have the results via phone call not MyChart.   Pt:  [phone]

## 2017-09-06 NOTE — Telephone Encounter (Signed)
Awaiting report

## 2017-09-06 NOTE — Telephone Encounter (Signed)
Last OV 08/13/17, No future OV  Please see message

## 2017-09-08 ENCOUNTER — Encounter: Payer: Self-pay | Admitting: Family Medicine

## 2017-09-08 DIAGNOSIS — R1031 Right lower quadrant pain: Secondary | ICD-10-CM

## 2017-09-09 ENCOUNTER — Telehealth (INDEPENDENT_AMBULATORY_CARE_PROVIDER_SITE_OTHER): Payer: Self-pay | Admitting: Orthopaedic Surgery

## 2017-09-09 ENCOUNTER — Encounter: Payer: Self-pay | Admitting: Internal Medicine

## 2017-09-09 NOTE — Telephone Encounter (Signed)
Patient called stating she received the results from her CT scan which showed "moderate disc bulge at L5/S1.  Patient stated that her PCP wanted her to call Dr. Durward Fortes with the results and to see if she should make a follow-up appointment.

## 2017-09-09 NOTE — Telephone Encounter (Signed)
PLEASE SCHEDULE FOR FOLLOW UP APPT TO GO OVER RESULTS

## 2017-09-09 NOTE — Telephone Encounter (Signed)
PLEASE ADVISE.

## 2017-09-09 NOTE — Telephone Encounter (Signed)
Yes-make an appt after she is cleared for any problems by her other physicians

## 2017-09-09 NOTE — Telephone Encounter (Signed)
Referral to gi placed. °

## 2017-09-10 DIAGNOSIS — N809 Endometriosis, unspecified: Secondary | ICD-10-CM | POA: Diagnosis not present

## 2017-09-10 DIAGNOSIS — R1031 Right lower quadrant pain: Secondary | ICD-10-CM | POA: Diagnosis not present

## 2017-09-11 ENCOUNTER — Encounter: Payer: Self-pay | Admitting: *Deleted

## 2017-09-17 ENCOUNTER — Encounter (INDEPENDENT_AMBULATORY_CARE_PROVIDER_SITE_OTHER): Payer: Self-pay | Admitting: Orthopaedic Surgery

## 2017-09-17 ENCOUNTER — Other Ambulatory Visit (INDEPENDENT_AMBULATORY_CARE_PROVIDER_SITE_OTHER): Payer: Self-pay | Admitting: Radiology

## 2017-09-17 ENCOUNTER — Ambulatory Visit (INDEPENDENT_AMBULATORY_CARE_PROVIDER_SITE_OTHER): Payer: 59 | Admitting: Orthopaedic Surgery

## 2017-09-17 ENCOUNTER — Other Ambulatory Visit: Payer: 59

## 2017-09-17 VITALS — BP 130/96 | HR 107 | Ht 66.0 in | Wt 190.0 lb

## 2017-09-17 DIAGNOSIS — G8929 Other chronic pain: Secondary | ICD-10-CM

## 2017-09-17 DIAGNOSIS — M5441 Lumbago with sciatica, right side: Principal | ICD-10-CM

## 2017-09-17 DIAGNOSIS — M25551 Pain in right hip: Secondary | ICD-10-CM

## 2017-09-17 NOTE — Progress Notes (Signed)
Office Visit Note   Patient: Erika Garrett           Date of Birth: 01-25-1980           MRN: [id number] Visit Date: 09/17/2017              Requested by: Leamon Arnt, MD [name] [place], [place] [address] PCP: Leamon Arnt, MD   Assessment & Plan: Visit Diagnoses:  1. Pain of right hip joint     Plan: [name] recently had a CT of her abdomen and pelvis with contrast dated 09/06/2017.  There was no evidence of bowel obstruction or bowel wall thickening.  There was no diverticulitis.  No abscess in the abdomen or pelvis appendix was normal.  There was evidence of leiomyomas of the uterus and a minimal ventral hernia containing only fat.  A musculoskeletal standpoint there was some moderate disc bulging at L5-S1.  Both are gynecologist and primary care physician did not think that anything on the study was responsible for her present pain.  I did call 1 of the musculoskeletal radiologist at [facility] to review the CT scan and they thought that the some bulging of the disc was towards the left where she is not symptomatic.. [name] continues to have significant pain that seems to originate somewhere in the right of her lumbar spine and in the area of her's sacroiliac joint radiating across the lateral aspect of her hip into her groin and then the anterior aspect of her thigh.  There was nothing on the CT scan in reference to her right hip and she is had some trouble when she sits for long time.  She is actually had some trouble sleeping at night.  I thought there may been an issue with her hip when she was here before based on her symptoms but there was no obvious pathology on the CT scan.  I do not think a labral tear would be determined by the CT scan but her symptoms do not seem to be consistent with that.  After much discussion I think the next step would be an MRI of her lumbar spine.  I would like to see if they can include the right sacroiliac joint in the study.  Long discussion  over nearly 30 minutes regarding all of the above.  I know she is frustrated with her pain  Follow-Up Instructions: Return AFTER mri l-s SPINE.   Orders:  No orders of the defined types were placed in this encounter.  No orders of the defined types were placed in this encounter.     Procedures: No procedures performed   Clinical Data: No additional findings.   Subjective: Chief Complaint  Patient presents with  . Follow-up    FOLLOW UP FOR BACK PAIN AND PELVIS WAS TOLD HAD BULDGING DISC   CT scan of the abdomen and pelvis with last week.  Apparently there was nothing significant that would account for her present pain.  She is been cleared by her gynecologist and her primary care physician.  Pain still seems to originate somewhere in the area of the lumbosacral region on the right radiates along the lateral aspect of her hip into her right groin and along the top of her thigh.  Distal to her knee.  The pain is been chronic and oftentimes interferes with much of her activities.  No history of injury or trauma.  I was concerned that the problem might be referable to her  hip when she was seen in the office recently but nothing on the CT scan.  I did check with the radiologist regarding any potential musculoskeletal decisions based on the study he did say that that arthritic change and disc bulging was to the left where she is asymptomatic  HPI  Review of Systems  Constitutional: Negative for fatigue and fever.  HENT: Negative for ear pain.   Eyes: Negative for pain.  Respiratory: Negative for cough and shortness of breath.   Cardiovascular: Negative for leg swelling.  Gastrointestinal: Negative for constipation and diarrhea.  Genitourinary: Negative for difficulty urinating.  Musculoskeletal: Positive for back pain. Negative for neck pain.  Skin: Negative for rash.  Allergic/Immunologic: Negative for food allergies.  Neurological: Positive for weakness and numbness.    Hematological: Does not bruise/bleed easily.  Psychiatric/Behavioral: Positive for sleep disturbance.     Objective: Vital Signs: BP (!) 130/96 (BP Location: Right Arm, Patient Position: Sitting, Cuff Size: Normal)   Pulse (!) 107   Ht 5\' 6"  (1.676 m)   Wt 190 lb (86.2 kg)   BMI 30.67 kg/m   Physical Exam  Constitutional: She is oriented to person, place, and time. She appears well-developed and well-nourished.  HENT:  Mouth/Throat: Oropharynx is clear and moist.  Eyes: Pupils are equal, round, and reactive to light. EOM are normal.  Pulmonary/Chest: Effort normal.  Neurological: She is alert and oriented to person, place, and time.  Skin: Skin is warm and dry.  Psychiatric: She has a normal mood and affect. Her behavior is normal.    Ortho Exam straight leg raise negative bilaterally.  Painless range of motion of her right hip and left hip today.  No distal edema.  Neurovascular exam intact.  Mild tenderness in the area of the right sacrum and sacroiliac joints.  Skin intact.  No particular pain over the greater trochanter  Specialty Comments:  No specialty comments available.  Imaging: No results found.   PMFS History: Patient Active Problem List   Diagnosis Date Noted  . Endometriosis of pelvis 06/25/2017  . Subclinical hypothyroidism 06/25/2017  . Obesity (BMI 30-39.9) 06/25/2017  . 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 Allergic rhinitis 01/14/2016   Past Medical History:  Diagnosis Date  . Endometriosis   . Hypertension 2016   Intercranial Hypertension  . Intracranial hypertension   . 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 Subclinical hypothyroidism   . Ventral hernia     Family History  Problem Relation Age of Onset  . Ovarian cancer Mother   . Endocrine tumor Father   . Alcohol abuse Father   . Healthy Sister   . Alzheimer's disease Maternal Grandmother   . Cancer Maternal Grandmother   . Hemachromatosis Paternal Grandmother      Past Surgical History:  Procedure Laterality Date  . ABLATION ON ENDOMETRIOSIS    . ROBOTIC ASSISTED LAPAROSCOPIC PARTIAL CYSTECTOMY     Social History   Occupational History  . Not on file  Tobacco Use  . Smoking status: Current Some Day Smoker  . Smokeless tobacco: Never Used  . Tobacco comment: socially  Substance and Sexual Activity  . Alcohol use: Yes    Comment: occasional  . Drug use: Never  . Sexual activity: Yes    Birth control/protection: Condom

## 2017-09-17 NOTE — Patient Instructions (Signed)

## 2017-09-19 ENCOUNTER — Other Ambulatory Visit: Payer: 59

## 2017-09-24 DIAGNOSIS — M545 Low back pain: Secondary | ICD-10-CM | POA: Diagnosis not present

## 2017-09-24 DIAGNOSIS — M533 Sacrococcygeal disorders, not elsewhere classified: Secondary | ICD-10-CM | POA: Diagnosis not present

## 2017-09-25 ENCOUNTER — Ambulatory Visit: Payer: 59 | Admitting: Internal Medicine

## 2017-09-26 ENCOUNTER — Other Ambulatory Visit: Payer: Self-pay | Admitting: Orthopedic Surgery

## 2017-09-26 DIAGNOSIS — M545 Low back pain, unspecified: Secondary | ICD-10-CM

## 2017-09-30 ENCOUNTER — Other Ambulatory Visit: Payer: Self-pay | Admitting: Orthopedic Surgery

## 2017-09-30 DIAGNOSIS — M533 Sacrococcygeal disorders, not elsewhere classified: Secondary | ICD-10-CM

## 2017-10-02 ENCOUNTER — Other Ambulatory Visit (INDEPENDENT_AMBULATORY_CARE_PROVIDER_SITE_OTHER): Payer: Self-pay | Admitting: Orthopaedic Surgery

## 2017-10-11 ENCOUNTER — Telehealth: Payer: Self-pay | Admitting: Family Medicine

## 2017-10-11 MED ORDER — LEVOTHYROXINE SODIUM 50 MCG PO TABS: 50.0000 ug | ORAL_TABLET | Freq: Every day | ORAL | 2 refills | Status: DC

## 2017-10-11 NOTE — Telephone Encounter (Signed)
Copied from [place] [id number]. Topic: Quick Communication - Rx Refill/Question >> Oct 11, 2017  3:38 PM Sheppard Coil, [name] wrote: Medication: levothyroxine (SYNTHROID, LEVOTHROID) 50 MCG tablet  Has the patient contacted their pharmacy? Yes - states they haven't heard from [name].  Pt states she has no medication left (Agent: If no, request that the patient contact the pharmacy for the refill.) (Agent: If yes, when and what did the pharmacy advise?)  Preferred Pharmacy (with phone number or street name): [facility], [place] - [address] [phone] (Phone) [phone] (Fax)  Agent: Please be advised that RX refills may take up to 3 business days. We ask that you follow-up with your pharmacy.

## 2017-10-11 NOTE — Telephone Encounter (Signed)
Prescription resent to requested pharmacy.

## 2017-10-12 ENCOUNTER — Ambulatory Visit
Admission: RE | Admit: 2017-10-12 | Discharge: 2017-10-12 | Disposition: A | Discharge status: Home / Self Care | Payer: 59 | Source: Ambulatory Visit | Attending: Orthopedic Surgery | Admitting: Orthopedic Surgery

## 2017-10-12 DIAGNOSIS — M545 Low back pain, unspecified: Secondary | ICD-10-CM

## 2017-10-22 DIAGNOSIS — D2261 Melanocytic nevi of right upper limb, including shoulder: Secondary | ICD-10-CM | POA: Diagnosis not present

## 2017-10-22 DIAGNOSIS — D2262 Melanocytic nevi of left upper limb, including shoulder: Secondary | ICD-10-CM | POA: Diagnosis not present

## 2017-10-22 DIAGNOSIS — D224 Melanocytic nevi of scalp and neck: Secondary | ICD-10-CM | POA: Diagnosis not present

## 2017-10-25 ENCOUNTER — Encounter: Payer: Self-pay | Admitting: *Deleted

## 2017-11-20 ENCOUNTER — Ambulatory Visit: Payer: 59 | Admitting: Internal Medicine

## 2017-11-22 ENCOUNTER — Ambulatory Visit: Payer: 59 | Admitting: Neurology

## 2017-11-22 ENCOUNTER — Telehealth: Payer: Self-pay | Admitting: Neurology

## 2017-11-22 DIAGNOSIS — Z0289 Encounter for other administrative examinations: Secondary | ICD-10-CM

## 2017-11-22 NOTE — Telephone Encounter (Signed)
Pt has called to be rescheduled, she has accepted 1st available appointment 06-06-2018 and is on wait list. Pt states she needs to eyes checked and would very much like to be called if RN is able to work her in earlier, pt states that her Eye doctor was supposed to have sent her chart over to Dr Jannifer Franklin re: the condition of her eyes.

## 2017-11-22 NOTE — Telephone Encounter (Signed)
This patient canceled the same day of a revisit appointment today. 

## 2017-11-23 ENCOUNTER — Other Ambulatory Visit: Payer: Self-pay | Admitting: Neurology

## 2017-11-25 ENCOUNTER — Encounter: Payer: Self-pay | Admitting: Neurology

## 2017-11-25 NOTE — Telephone Encounter (Signed)
Pt returning RNs call accepted appt for 11/ 7 at 1:30

## 2017-11-25 NOTE — Telephone Encounter (Signed)
Spoke with pt. She noshowed her 11/11/9 appt. with Dr. Jannifer Franklin, she sts. b/c she was on a flight that day which was ? delayed and caused her to miss appt. She r/s, but next available was in May 2020. I will work to find her a sooner appt.  She sts. she saw ophthalmology as directed at last ov, but I do not have documentation that we received those notes. She will ask her ophthalmologist to fax notes to our office, my attn, fax# [phone]. Per CKW last ov note, if papilledema is resolved, Diamox may be d/c'd. Pt. would like to discuss this with CKW if he plans to d/c Diamox and I explained if this is the case, he will call her to discuss.  She verbalized understanding of same/fim

## 2017-11-25 NOTE — Telephone Encounter (Signed)
I called Erika Garrett to offer her an appt with Dr. Jannifer Franklin on 11/28/17 at 1:30pm. No answer, left a message asking her to call me back.  If Erika Garrett calls back, please offer her this appt date and time. If she has not called back by 5:00pm today, I will release this opening.

## 2017-11-26 ENCOUNTER — Telehealth: Payer: Self-pay | Admitting: Neurology

## 2017-11-26 ENCOUNTER — Other Ambulatory Visit: Payer: Self-pay | Admitting: Neurology

## 2017-11-26 MED ORDER — ACETAZOLAMIDE 250 MG PO TABS: 250.0000 mg | ORAL_TABLET | Freq: Every day | ORAL | 1 refills | Status: DC

## 2017-11-26 NOTE — Telephone Encounter (Signed)
The patient had an ophthalmology evaluation at [facility], optic nerves appear to look good, minimal edema possible on the right but not definite.  The patient likely could come off of the Diamox at some point, she missed her last revisit appointment.  I will continue on the medication until I can see her.

## 2017-11-28 ENCOUNTER — Ambulatory Visit (INDEPENDENT_AMBULATORY_CARE_PROVIDER_SITE_OTHER): Payer: 59 | Admitting: Neurology

## 2017-11-28 ENCOUNTER — Encounter

## 2017-11-28 ENCOUNTER — Encounter: Payer: Self-pay | Admitting: Neurology

## 2017-11-28 ENCOUNTER — Other Ambulatory Visit: Payer: Self-pay

## 2017-11-28 VITALS — BP 125/87 | HR 96 | Resp 16 | Ht 66.0 in | Wt 193.5 lb

## 2017-11-28 DIAGNOSIS — G932 Benign intracranial hypertension: Secondary | ICD-10-CM

## 2017-11-28 NOTE — Progress Notes (Signed)
Reason for visit: Pseudotumor cerebri  Erika Garrett is an 36 y.o. female  History of present illness:  Erika Garrett is a 37 year old left-handed white female with a history of pseudotumor cerebri thought secondary to oral contraceptive medication.  The patient has come off the medication and she has done fairly well.  The patient has been to see Dr. Valetta Close from ophthalmology, no significant papilledema was noted.  The patient will be having visual field testing in the near future.  The patient is on low-dose Diamox, 250 mg daily.  She does not have any headaches or head pressure sensations.  She does have some pain in the right lower back going into the right hip and groin area, she is followed through orthopedic surgery for this, she has had MRI of the lumbar spine done recently that did not show evidence of nerve root impingement.  The patient returns to this office for an evaluation.  Past Medical History:  Diagnosis Date  . Endometriosis   . Hypertension 2016   Intercranial Hypertension  . Intracranial hypertension   . 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 Subclinical hypothyroidism   . Ventral hernia     Past Surgical History:  Procedure Laterality Date  . ABLATION ON ENDOMETRIOSIS    . ROBOTIC ASSISTED LAPAROSCOPIC PARTIAL CYSTECTOMY      Family History  Problem Relation Age of Onset  . Ovarian cancer Mother   . Endocrine tumor Father   . Alcohol abuse Father   . Healthy Sister   . Alzheimer's disease Maternal Grandmother   . Cancer Maternal Grandmother   . Hemachromatosis Paternal Grandmother     Social history:  reports that she has been smoking. She has never used smokeless tobacco. She reports that she drinks alcohol. She reports that she does not use drugs.    Allergies  Allergen Reactions  . Estrogens     BIH  . Zithromax [Azithromycin] Hives    Medications:  Prior to Admission medications   Medication Sig Start Date End Date Taking? Authorizing  Provider  acetaZOLAMIDE (DIAMOX) 250 MG tablet Take 1 tablet (250 mg total) by mouth daily. 11/26/17  Yes Kathrynn Ducking, MD  Bacillus Coagulans-Inulin (ALIGN PREBIOTIC-PROBIOTIC PO) Take by mouth.   Yes [provider]  Cetirizine HCl (ZYRTEC PO) Take 1 tablet by mouth daily.   Yes [provider]  Cholecalciferol (VITAMIN D PO) Take 1 tablet by mouth daily.   Yes [provider]  ibuprofen (ADVIL,MOTRIN) 200 MG tablet Take 200 mg by mouth every 6 (six) hours as needed.   Yes [provider]  levothyroxine (SYNTHROID, LEVOTHROID) 50 MCG tablet Take 1 tablet (50 mcg total) by mouth daily before breakfast. 10/11/17  Yes Leamon Arnt, MD  Multiple Vitamins-Minerals (MULTIVITAMIN PO) Take 1 tablet by mouth daily.   Yes [provider]  Omega-3 Fatty Acids (FISH OIL PO) Take 1 Dose by mouth daily.   Yes [provider]    ROS:  Out of a complete 14 system review of symptoms, the patient complains only of the following symptoms, and all other reviewed systems are negative.  Back pain  Blood pressure 125/87, pulse 96, resp. rate 16, height 5\' 6"  (1.676 m), weight 193 lb 8 oz (87.8 kg).  Physical Exam  General: The patient is alert and cooperative at the time of the examination.  The patient is moderately obese.  Skin: No significant peripheral edema is noted.   Neurologic Exam  Mental  status: The patient is alert and oriented x 3 at the time of the examination. The patient has apparent normal recent and remote memory, with an apparently normal attention span and concentration ability.   Cranial nerves: Facial symmetry is present. Speech is normal, no aphasia or dysarthria is noted. Extraocular movements are full. Visual fields are full.   Pupils ar equal,e round, and reactive to light.  No venous pulsations were noted.  Discs appear to be flat. Motor: The patient has good strength in all 4 extremities.  Sensory examination: Soft  touch sensation is symmetric on the face, arms, and legs.  Coordination: The patient has good finger-nose-finger and heel-to-shin bilaterally.  Gait and station: The patient has a normal gait. Tandem gait is normal. Romberg is negative. No drift is seen.  Reflexes: Deep tendon reflexes are symmetric.   Assessment/Plan:  1.  Pseudotumor cerebri, resolved  The patient may stop the Diamox, I have recommended a follow-up with her ophthalmologist in 6 months.  If the patient continues to do well, annual follow-up would be recommended.  The patient will follow-up here if needed.  Jill Alexanders MD 11/28/2017 1:27 PM  [facility] [address] [place], [place] [address]  Phone [phone] Fax [phone]

## 2017-11-28 NOTE — Patient Instructions (Signed)
Stop the Diamox.

## 2017-11-29 DIAGNOSIS — M533 Sacrococcygeal disorders, not elsewhere classified: Secondary | ICD-10-CM | POA: Diagnosis not present

## 2017-11-29 NOTE — Progress Notes (Signed)
Reviewed report/notes and updated pt's chart/history/PL and/or HM accordingly. 

## 2017-12-02 DIAGNOSIS — G932 Benign intracranial hypertension: Secondary | ICD-10-CM | POA: Diagnosis not present

## 2017-12-26 ENCOUNTER — Encounter: Payer: Self-pay | Admitting: Podiatry

## 2017-12-26 ENCOUNTER — Ambulatory Visit (INDEPENDENT_AMBULATORY_CARE_PROVIDER_SITE_OTHER): Payer: 59

## 2017-12-26 ENCOUNTER — Ambulatory Visit (INDEPENDENT_AMBULATORY_CARE_PROVIDER_SITE_OTHER): Payer: 59 | Admitting: Podiatry

## 2017-12-26 ENCOUNTER — Other Ambulatory Visit: Payer: Self-pay | Admitting: Podiatry

## 2017-12-26 DIAGNOSIS — M779 Enthesopathy, unspecified: Secondary | ICD-10-CM | POA: Diagnosis not present

## 2017-12-26 DIAGNOSIS — G932 Benign intracranial hypertension: Secondary | ICD-10-CM | POA: Diagnosis not present

## 2017-12-26 DIAGNOSIS — M79672 Pain in left foot: Secondary | ICD-10-CM | POA: Diagnosis not present

## 2017-12-26 MED ORDER — TRIAMCINOLONE ACETONIDE 10 MG/ML IJ SUSP
10.0000 mg | Freq: Once | INTRAMUSCULAR | Status: AC
  Administered 2017-12-26: 10 mg

## 2017-12-26 MED ORDER — DICLOFENAC SODIUM 75 MG PO TBEC: 75.0000 mg | DELAYED_RELEASE_TABLET | Freq: Two times a day (BID) | ORAL | 2 refills | Status: DC

## 2017-12-26 NOTE — Progress Notes (Signed)
Subjective:   Patient ID: Erika Garrett, female   DOB: 37 y.o.   MRN: [id number]   HPI Patient presents with a lot of pain of approximately 5 months duration left forefoot and states is worse when she first gets up in the morning and then is bothersome during the day.  States that it is worse early and patient does not smoke currently and likes to be active   Review of Systems  All other systems reviewed and are negative.       Objective:  Physical Exam  Constitutional: She appears well-developed and well-nourished.  Cardiovascular: Intact distal pulses.  Pulmonary/Chest: Effort normal.  Musculoskeletal: Normal range of motion.  Neurological: She is alert.  Skin: Skin is warm.  Nursing note and vitals reviewed.   Neurovascular status intact muscle strength is adequate range of motion within normal limits with patient noted to have exquisite discomfort third MPJ left with inflammation fluid buildup and mild discomfort on the dorsum of the left foot that is not intense but present.  Patient has good digital perfusion well oriented x3     Assessment:  Probability for acute capsulitis third MPJ left with moderate tendinitis left which is probably compensatory     Plan:  H&P condition reviewed and discussed capsular injection with aspiration and explained risk to patient.  Patient wants procedure and I did a proximal nerve block sterile prep of the area and then aspirated the joint getting out a small amount of clear fluid and injected with quarter cc of dexamethasone Kenalog to reduce the inflammation and applied thick plantar padding to take pressure off the joint surface.  Placed on oral anti-inflammatory diclofenac 75 mg twice daily and reappoint 2 weeks  X-ray indicated that there is no indications of stress fracture or arthritis

## 2018-01-09 ENCOUNTER — Encounter: Payer: Self-pay | Admitting: Podiatry

## 2018-01-09 ENCOUNTER — Ambulatory Visit (INDEPENDENT_AMBULATORY_CARE_PROVIDER_SITE_OTHER): Payer: 59 | Admitting: Podiatry

## 2018-01-09 DIAGNOSIS — M779 Enthesopathy, unspecified: Secondary | ICD-10-CM

## 2018-01-11 NOTE — Progress Notes (Signed)
Subjective:   Patient ID: Erika Garrett, female   DOB: 37 y.o.   MRN: [id number]   HPI Patient states the bottom of her left foot feels significantly better with diminishment of discomfort   ROS      Objective:  Physical Exam  Neurovascular status intact with patient found to have significant reduction inflammation third MPJ left with minimal swelling noted currently     Assessment:  Doing much better with previous treatment provided with mild symptoms still noted     Plan:  Reviewed condition and recommended that we continue with conservative care consisting of rigid bottom shoes padding therapy with possibility for orthotics if symptoms persist

## 2018-03-17 DIAGNOSIS — Z23 Encounter for immunization: Secondary | ICD-10-CM | POA: Diagnosis not present

## 2018-03-28 DIAGNOSIS — H01114 Allergic dermatitis of left upper eyelid: Secondary | ICD-10-CM | POA: Diagnosis not present

## 2018-03-28 DIAGNOSIS — H01111 Allergic dermatitis of right upper eyelid: Secondary | ICD-10-CM | POA: Diagnosis not present

## 2018-03-28 DIAGNOSIS — G932 Benign intracranial hypertension: Secondary | ICD-10-CM | POA: Diagnosis not present

## 2018-04-03 DIAGNOSIS — H01119 Allergic dermatitis of unspecified eye, unspecified eyelid: Secondary | ICD-10-CM | POA: Diagnosis not present

## 2018-06-06 ENCOUNTER — Ambulatory Visit: Payer: 59 | Admitting: Neurology

## 2018-08-14 ENCOUNTER — Other Ambulatory Visit: Payer: Self-pay | Admitting: Family Medicine

## 2018-08-23 ENCOUNTER — Other Ambulatory Visit: Payer: Self-pay | Admitting: Family Medicine

## 2018-08-25 ENCOUNTER — Encounter: Payer: Self-pay | Admitting: Family Medicine

## 2018-09-17 ENCOUNTER — Other Ambulatory Visit: Payer: Self-pay

## 2018-09-17 ENCOUNTER — Encounter: Payer: Self-pay | Admitting: Family Medicine

## 2018-09-17 ENCOUNTER — Ambulatory Visit (INDEPENDENT_AMBULATORY_CARE_PROVIDER_SITE_OTHER): Payer: 59 | Admitting: Family Medicine

## 2018-09-17 VITALS — BP 100/68 | HR 65 | Temp 98.3°F | Resp 16 | Ht 66.25 in | Wt 205.2 lb

## 2018-09-17 DIAGNOSIS — R102 Pelvic and perineal pain unspecified side: Secondary | ICD-10-CM

## 2018-09-17 DIAGNOSIS — E039 Hypothyroidism, unspecified: Secondary | ICD-10-CM | POA: Diagnosis not present

## 2018-09-17 DIAGNOSIS — D219 Benign neoplasm of connective and other soft tissue, unspecified: Secondary | ICD-10-CM | POA: Insufficient documentation

## 2018-09-17 DIAGNOSIS — Z0000 Encounter for general adult medical examination without abnormal findings: Secondary | ICD-10-CM | POA: Diagnosis not present

## 2018-09-17 DIAGNOSIS — Z23 Encounter for immunization: Secondary | ICD-10-CM

## 2018-09-17 DIAGNOSIS — E038 Other specified hypothyroidism: Secondary | ICD-10-CM

## 2018-09-17 HISTORY — DX: Benign neoplasm of connective and other soft tissue, unspecified: D21.9

## 2018-09-17 LAB — CBC WITH DIFFERENTIAL/PLATELET
Basophils Absolute: 0 10*3/uL (ref 0.0–0.1)
Basophils Relative: 0.4 % (ref 0.0–3.0)
Eosinophils Absolute: 0.1 10*3/uL (ref 0.0–0.7)
Eosinophils Relative: 1.7 % (ref 0.0–5.0)
HCT: 41.3 % (ref 36.0–46.0)
Hemoglobin: 14.5 g/dL (ref 12.0–15.0)
Lymphocytes Relative: 29.4 % (ref 12.0–46.0)
Lymphs Abs: 2.4 10*3/uL (ref 0.7–4.0)
MCHC: 35 g/dL (ref 30.0–36.0)
MCV: 90.9 fl (ref 78.0–100.0)
Monocytes Absolute: 0.4 10*3/uL (ref 0.1–1.0)
Monocytes Relative: 4.6 % (ref 3.0–12.0)
Neutro Abs: 5.2 10*3/uL (ref 1.4–7.7)
Neutrophils Relative %: 63.9 % (ref 43.0–77.0)
Platelets: 246 10*3/uL (ref 150.0–400.0)
RBC: 4.55 Mil/uL (ref 3.87–5.11)
RDW: 12.8 % (ref 11.5–15.5)
WBC: 8.1 10*3/uL (ref 4.0–10.5)

## 2018-09-17 LAB — LIPID PANEL
Cholesterol: 208 mg/dL — ABNORMAL HIGH (ref 0–200)
HDL: 39.6 mg/dL (ref >39.00)
NonHDL: 168.5
Total CHOL/HDL Ratio: 5
Triglycerides: 202 mg/dL — ABNORMAL HIGH (ref 0.0–149.0)
VLDL: 40.4 mg/dL — ABNORMAL HIGH (ref 0.0–40.0)

## 2018-09-17 LAB — COMPREHENSIVE METABOLIC PANEL
ALT: 55 U/L — ABNORMAL HIGH (ref 0–35)
AST: 33 U/L (ref 0–37)
Albumin: 4.6 g/dL (ref 3.5–5.2)
Alkaline Phosphatase: 80 U/L (ref 39–117)
BUN: 23 mg/dL (ref 6–23)
CO2: 26 mEq/L (ref 19–32)
Calcium: 9.2 mg/dL (ref 8.4–10.5)
Chloride: 105 mEq/L (ref 96–112)
Creatinine, Ser: 0.69 mg/dL (ref 0.40–1.20)
GFR: 94.75 mL/min (ref >60.00)
Glucose, Bld: 97 mg/dL (ref 70–99)
Potassium: 4.4 mEq/L (ref 3.5–5.1)
Sodium: 140 mEq/L (ref 135–145)
Total Bilirubin: 0.5 mg/dL (ref 0.2–1.2)
Total Protein: 6.6 g/dL (ref 6.0–8.3)

## 2018-09-17 LAB — LDL CHOLESTEROL, DIRECT: Direct LDL: 135 mg/dL

## 2018-09-17 LAB — TSH: TSH: 1.46 u[IU]/mL (ref 0.35–4.50)

## 2018-09-17 LAB — T4, FREE: Free T4: 0.87 ng/dL (ref 0.60–1.60)

## 2018-09-17 NOTE — Progress Notes (Signed)
Subjective  Chief Complaint  Patient presents with  . Annual Exam    Fasting    HPI: Erika Garrett is a 38 y.o. female who presents to [facility] [place] today for a Female Wellness Visit.  She also has the concerns and/or needs as listed above in the chief complaint. These will be addressed in addition to the Health Maintenance Visit.   Wellness Visit: annual visit with health maintenance review and exam without Pap   HM: up to date. Fasting for labs.  Chronic disease management visit and/or acute problem visit:  Pelvic/rlq pain - last evaluated here 07/2017; sent to gyn, ortho and abd/pelvic ct. CT showed fibroids. Ortho has worked with her on MSK back pain with PT and injections. Now being sent to pelvic health specialist. Overall, much improved but still with localized pain now near the perineum with certain positions (bending forward). Feeling better about things.   Saw neuro for h/o Pseudotumor cerebri; now off meds and stable.   Hypothyroidism: on meds daily. No sxs of high or low thyroid.   Assessment  1. Annual physical exam   2. Subclinical hypothyroidism   3. Pelvic pain      Plan  Female Wellness Visit:  Age appropriate Health Maintenance and Prevention measures were discussed with patient. Included topics are cancer screening recommendations, ways to keep healthy (see AVS) including dietary and exercise recommendations, regular eye and dental care, use of seat belts, and avoidance of moderate alcohol use and tobacco use.   BMI: discussed patient's BMI and encouraged positive lifestyle modifications to help get to or maintain a target BMI.  HM needs and immunizations were addressed and ordered. See below for orders. See HM and immunization section for updates. Flu shot today.  Routine labs and screening tests ordered including cmp, cbc and lipids where appropriate.  Discussed recommendations regarding Vit D and calcium supplementation (see AVS)  Chronic disease f/u and/or acute problem visit: (deemed necessary to be done in addition to the wellness visit):  hypothyroidism:  On supplements. Check labs. Clinically euthyroid  Pelvic pain: much improved. Msk. F/u with pelvic health specialist.   Follow up: Return in about 1 year (around 09/17/2019) for complete physical.   Orders Placed This Encounter  Procedures  . CBC with Differential/Platelet  . Comprehensive metabolic panel  . Lipid panel  . HIV Antibody (routine testing w rflx)  . TSH  . T4, free  . T3   No orders of the defined types were placed in this encounter.     Lifestyle: Body mass index is 32.87 kg/m. Wt Readings from Last 3 Encounters:  09/17/18 205 lb 3.2 oz (93.1 kg)  11/28/17 193 lb 8 oz (87.8 kg)  09/17/17 190 lb (86.2 kg)    Patient Active Problem List   Diagnosis Date Noted  . Fibroids 09/17/2018  . Endometriosis of pelvis 06/25/2017  . Subclinical hypothyroidism 06/25/2017  . Obesity (BMI 30-39.9) 06/25/2017  . 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 Dr. Jannifer Franklin    . Allergic rhinitis 01/14/2016   Health Maintenance  Topic Date Due  . HIV Screening  10/10/1994  . INFLUENZA VACCINE  08/23/2018  . PAP SMEAR-Modifier  04/22/2020  . TETANUS/TDAP  07/12/2027   Immunization History  Administered Date(s) Administered  . Influenza,inj,Quad PF,6+ Mos 12/07/2017  . Tdap 07/11/2017   We updated and reviewed the patient's past history in detail and it is documented below. Allergies: Patient  reports current alcohol use. 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 Past Surgical History Patient  has a past surgical history that includes Robotic assisted laparoscopic partial cystectomy and Ablation on  endometriosis. Social History   Socioeconomic History  . Marital status: Divorced    Spouse name: Not on file  . Number of children: 0  . Years of education: BA  . Highest education level: Not on file  Occupational History  . Not on file  Social Needs  . Financial resource strain: Not on file  . Food insecurity    Worry: Not on file    Inability: Not on file  . Transportation needs    Medical: Not on file    Non-medical: Not on file  Tobacco Use  . Smoking status: Former Research scientist (life sciences)  . Smokeless tobacco: Never Used  . Tobacco comment: socially  Substance and Sexual Activity  . Alcohol use: Yes    Comment: occasional  . Drug use: Never  . Sexual activity: Yes    Birth control/protection: Condom  Lifestyle  . Physical activity    Days per week: Not on file    Minutes per session: Not on file  . Stress: Not on file  Relationships  . Social Herbalist on phone: Not on file    Gets together: Not on file    Attends religious service: Not on file    Active member of club or organization: Not on file    Attends meetings of clubs or organizations: Not on file    Relationship status: Not on file  Other Topics Concern  . Not on file  Social History Narrative   Lives w/ long term boyfriend   Caffeine use: 2-3 cups coffee per day   Left handed    Family History  Problem Relation Age of Onset  . Ovarian cancer Mother   . Endocrine tumor Father   . Alcohol abuse Father   . Healthy Sister   . Alzheimer's disease Maternal Grandmother   . Cancer Maternal Grandmother   . Hemachromatosis Paternal Grandmother     Review of Systems: Constitutional: negative for fever or malaise Ophthalmic: negative for photophobia, double vision or loss of vision Cardiovascular: negative for chest pain, dyspnea on exertion, or new LE swelling Respiratory: negative for SOB or persistent cough Gastrointestinal: negative for abdominal pain, change in bowel habits or melena Genitourinary:  negative for dysuria or gross hematuria, no abnormal uterine bleeding or disharge Musculoskeletal: negative for new gait disturbance or muscular weakness Integumentary: negative for new or persistent rashes, no breast lumps Neurological: negative for TIA or stroke symptoms Psychiatric: negative for SI or delusions Allergic/Immunologic: negative for hives  Patient Care Team    Relationship Specialty Notifications Start End  Leamon Arnt, MD PCP - General Family Medicine  06/25/17   Charyl Bigger, MD Consulting Physician Obstetrics and Gynecology  05/22/17   Kathrynn Ducking, MD Consulting Physician Neurology  06/25/17   Dr. Oren Binet  Dentistry  06/25/17   Opthamology, [facility]  Ophthalmology  06/25/17   Jola Schmidt, MD Consulting Physician Ophthalmology  11/28/17     Objective  Vitals: BP 100/68   Pulse 65   Temp 98.3 F (36.8 C) (Oral)   Resp 16   Ht 5' 6.25" (1.683 m)   Wt 205 lb 3.2 oz (93.1 kg)   LMP  09/13/2018   SpO2 98%   BMI 32.87 kg/m  General:  Well developed, well nourished, no acute distress  Psych:  Alert and orientedx3,normal mood and affect HEENT:  Normocephalic, atraumatic, non-icteric sclera, PERRL, oropharynx is clear without mass or exudate, supple neck without adenopathy, mass or thyromegaly Cardiovascular:  Normal S1, S2, RRR without gallop, rub or murmur, nondisplaced PMI Respiratory:  Good breath sounds bilaterally, CTAB with normal respiratory effort Gastrointestinal: normal bowel sounds, soft, non-tender, no noted masses. No HSM MSK: no deformities, contusions. Joints are without erythema or swelling. Spine and CVA region are nontender Skin:  Warm, no rashes or suspicious lesions noted Neurologic:    Mental status is normal. CN 2-11 are normal. Gross motor and sensory exams are normal. Normal gait. No tremor    Commons side effects, risks, benefits, and alternatives for medications and treatment plan prescribed today were discussed, and the patient  expressed understanding of the given instructions. Patient is instructed to call or message via MyChart if he/she has any questions or concerns regarding our treatment plan. No barriers to understanding were identified. We discussed Red Flag symptoms and signs in detail. Patient expressed understanding regarding what to do in case of urgent or emergency type symptoms.   Medication list was reconciled, printed and provided to the patient in AVS. Patient instructions and summary information was reviewed with the patient as documented in the AVS. This note was prepared with assistance of Dragon voice recognition software. Occasional wrong-word or sound-a-like substitutions may have occurred due to the inherent limitations of voice recognition software

## 2018-09-17 NOTE — Patient Instructions (Signed)
Please return in 12 months for your annual complete physical; please come fasting.  I will release your lab results to you on your MyChart account with further instructions. Please reply with any questions.    If you have any questions or concerns, please don't hesitate to send me a message via MyChart or call the office at [phone]. Thank you for visiting with Erika Garrett today! It's our pleasure caring for you.   Preventive Care 70-39 Years Old, Female Preventive care refers to visits with your health care provider and lifestyle choices that can promote health and wellness. This includes:  A yearly physical exam. This may also be called an annual well check.  Regular dental visits and eye exams.  Immunizations.  Screening for certain conditions.  Healthy lifestyle choices, such as eating a healthy diet, getting regular exercise, not using drugs or products that contain nicotine and tobacco, and limiting alcohol use. What can I expect for my preventive care visit? Physical exam Your health care provider will check your:  Height and weight. This may be used to calculate body mass index (BMI), which tells if you are at a healthy weight.  Heart rate and blood pressure.  Skin for abnormal spots. Counseling Your health care provider may ask you questions about your:  Alcohol, tobacco, and drug use.  Emotional well-being.  Home and relationship well-being.  Sexual activity.  Eating habits.  Work and work Statistician.  Method of birth control.  Menstrual cycle.  Pregnancy history. What immunizations do I need?  Influenza (flu) vaccine  This is recommended every year. Tetanus, diphtheria, and pertussis (Tdap) vaccine  You may need a Td booster every 10 years. Varicella (chickenpox) vaccine  You may need this if you have not been vaccinated. Human papillomavirus (HPV) vaccine  If recommended by your health care provider, you may need three doses over 6 months.  Measles, mumps, and rubella (MMR) vaccine  You may need at least one dose of MMR. You may also need a second dose. Meningococcal conjugate (MenACWY) vaccine  One dose is recommended if you are age 74-21 years and a first-year college student living in a residence hall, or if you have one of several medical conditions. You may also need additional booster doses. Pneumococcal conjugate (PCV13) vaccine  You may need this if you have certain conditions and were not previously vaccinated. Pneumococcal polysaccharide (PPSV23) vaccine  You may need one or two doses if you smoke cigarettes or if you have certain conditions. Hepatitis A vaccine  You may need this if you have certain conditions or if you travel or work in places where you may be exposed to hepatitis A. Hepatitis B vaccine  You may need this if you have certain conditions or if you travel or work in places where you may be exposed to hepatitis B. Haemophilus influenzae type b (Hib) vaccine  You may need this if you have certain conditions. You may receive vaccines as individual doses or as more than one vaccine together in one shot (combination vaccines). Talk with your health care provider about the risks and benefits of combination vaccines. What tests do I need?  Blood tests  Lipid and cholesterol levels. These may be checked every 5 years starting at age 62.  Hepatitis C test.  Hepatitis B test. Screening  Diabetes screening. This is done by checking your blood sugar (glucose) after you have not eaten for a while (fasting).  Sexually transmitted disease (STD) testing.  BRCA-related cancer screening. This may  be done if you have a family history of breast, ovarian, tubal, or peritoneal cancers.  Pelvic exam and Pap test. This may be done every 3 years starting at age 21. Starting at age 30, this may be done every 5 years if you have a Pap test in combination with an HPV test. Talk with your health care provider about  your test results, treatment options, and if necessary, the need for more tests. Follow these instructions at home: Eating and drinking   Eat a diet that includes fresh fruits and vegetables, whole grains, lean protein, and low-fat dairy.  Take vitamin and mineral supplements as recommended by your health care provider.  Do not drink alcohol if: ? Your health care provider tells you not to drink. ? You are pregnant, may be pregnant, or are planning to become pregnant.  If you drink alcohol: ? Limit how much you have to 0-1 drink a day. ? Be aware of how much alcohol is in your drink. In the [place] one drink equals one 12 oz bottle of beer (355 mL), one 5 oz glass of wine (148 mL), or one 1 oz glass of hard liquor (44 mL). Lifestyle  Take daily care of your teeth and gums.  Stay active. Exercise for at least 30 minutes on 5 or more days each week.  Do not use any products that contain nicotine or tobacco, such as cigarettes, e-cigarettes, and chewing tobacco. If you need help quitting, ask your health care provider.  If you are sexually active, practice safe sex. Use a condom or other form of birth control (contraception) in order to prevent pregnancy and STIs (sexually transmitted infections). If you plan to become pregnant, see your health care provider for a preconception visit. What's next?  Visit your health care provider once a year for a well check visit.  Ask your health care provider how often you should have your eyes and teeth checked.  Stay up to date on all vaccines. This information is not intended to replace advice given to you by your health care provider. Make sure you discuss any questions you have with your health care provider. Document Released: 03/06/2001 Document Revised: 09/19/2017 Document Reviewed: 09/19/2017 Elsevier Patient Education  2020 Elsevier Inc.   

## 2018-09-18 ENCOUNTER — Encounter: Payer: 59 | Admitting: Family Medicine

## 2018-09-18 LAB — HIV ANTIBODY (ROUTINE TESTING W REFLEX): HIV 1&2 Ab, 4th Generation: NONREACTIVE

## 2018-09-18 LAB — T3: T3, Total: 118 ng/dL (ref 76–181)

## 2018-09-19 ENCOUNTER — Encounter: Payer: Self-pay | Admitting: Family Medicine

## 2018-09-22 ENCOUNTER — Other Ambulatory Visit: Payer: Self-pay | Admitting: *Deleted

## 2018-09-22 DIAGNOSIS — R7989 Other specified abnormal findings of blood chemistry: Secondary | ICD-10-CM

## 2018-10-09 ENCOUNTER — Other Ambulatory Visit: Payer: Self-pay | Admitting: Family Medicine

## 2018-10-09 MED ORDER — LEVOTHYROXINE SODIUM 50 MCG PO TABS: ORAL_TABLET | ORAL | 0 refills | Status: DC

## 2018-10-09 NOTE — Telephone Encounter (Signed)
Copied from [facility] [id number]. Topic: Quick Communication - Rx Refill/Question >> Oct 09, 2018 12:33 PM Leward Quan A wrote: Medication: levothyroxine (SYNTHROID) 50 MCG tablet  Patient is completely out of medication asking for refills to be sent to pharmacy that will last till her next physical   Has the patient contacted their pharmacy? Yes.   (Agent: If no, request that the patient contact the pharmacy for the refill.) (Agent: If yes, when and what did the pharmacy advise?)  Preferred Pharmacy (with phone number or street name): [facility] [place], [place] - [address] [place] [phone] (Phone) [phone] (Fax)    Agent: Please be advised that RX refills may take up to 3 business days. We ask that you follow-up with your pharmacy.

## 2018-11-05 ENCOUNTER — Other Ambulatory Visit: Payer: Self-pay | Admitting: Family Medicine

## 2018-12-12 ENCOUNTER — Other Ambulatory Visit: Payer: Self-pay

## 2018-12-15 ENCOUNTER — Other Ambulatory Visit (INDEPENDENT_AMBULATORY_CARE_PROVIDER_SITE_OTHER): Payer: 59

## 2018-12-15 ENCOUNTER — Other Ambulatory Visit: Payer: Self-pay

## 2018-12-15 DIAGNOSIS — R7989 Other specified abnormal findings of blood chemistry: Secondary | ICD-10-CM

## 2018-12-15 LAB — HEPATIC FUNCTION PANEL
ALT: 41 U/L — ABNORMAL HIGH (ref 0–35)
AST: 22 U/L (ref 0–37)
Albumin: 4 g/dL (ref 3.5–5.2)
Alkaline Phosphatase: 93 U/L (ref 39–117)
Bilirubin, Direct: 0.1 mg/dL (ref 0.0–0.3)
Total Bilirubin: 0.5 mg/dL (ref 0.2–1.2)
Total Protein: 6.3 g/dL (ref 6.0–8.3)

## 2019-01-03 ENCOUNTER — Encounter: Payer: Self-pay | Admitting: Family Medicine

## 2019-01-04 ENCOUNTER — Encounter: Payer: Self-pay | Admitting: Family Medicine

## 2019-01-05 ENCOUNTER — Encounter: Payer: Self-pay | Admitting: Family Medicine

## 2019-01-05 ENCOUNTER — Ambulatory Visit: Payer: Self-pay | Admitting: *Deleted

## 2019-01-05 ENCOUNTER — Other Ambulatory Visit: Payer: Self-pay

## 2019-01-05 ENCOUNTER — Ambulatory Visit (INDEPENDENT_AMBULATORY_CARE_PROVIDER_SITE_OTHER): Payer: 59 | Admitting: Family Medicine

## 2019-01-05 VITALS — BP 122/74 | HR 77 | Temp 97.4°F | Ht 66.25 in | Wt 195.0 lb

## 2019-01-05 DIAGNOSIS — R300 Dysuria: Secondary | ICD-10-CM

## 2019-01-05 DIAGNOSIS — R1031 Right lower quadrant pain: Secondary | ICD-10-CM

## 2019-01-05 DIAGNOSIS — G8929 Other chronic pain: Secondary | ICD-10-CM

## 2019-01-05 LAB — POCT URINALYSIS DIPSTICK
Bilirubin, UA: NEGATIVE
Blood, UA: NEGATIVE
Glucose, UA: NEGATIVE
Ketones, UA: POSITIVE
Leukocytes, UA: NEGATIVE
Nitrite, UA: NEGATIVE
Protein, UA: NEGATIVE
Spec Grav, UA: >=1.03 — AB (ref 1.010–1.025)
Urobilinogen, UA: 0.2 E.U./dL
pH, UA: 5.5 (ref 5.0–8.0)

## 2019-01-05 MED ORDER — DICYCLOMINE HCL 10 MG PO CAPS: 10.0000 mg | ORAL_CAPSULE | Freq: Three times a day (TID) | ORAL | 2 refills | Status: DC

## 2019-01-05 NOTE — Telephone Encounter (Signed)
Patient is scheduled for appointment this afternoon.

## 2019-01-05 NOTE — Progress Notes (Signed)
Subjective  CC:  Chief Complaint  Patient presents with  . Abdominal Pain    Clear diet since Saturday has helped.    HPI: Erika Garrett is a 38 y.o. female who presents to the office today to address the problems listed above in the chief complaint.  C/o flare of chronic RLQ pain that started last week: acute onset after eating fast food which is rare for her. Since, describes "spasms of pain, severe", w/o f/c/s, change in stools, blood in stool, nausea, vomiting. She started a clear liquid diet and used advil, both resolved the pain. She has a chronic ache in RLQ evaluated by ortho, GYN and pelvic floor specialist over the last 2-3 years. Physical therapy for pelvic strengthening was helpful earlier this year. She never has been evaluated by GI. She feels that it is "intestinal" at this point. She does have endometriosis managed by gyn. She does not have insurance until jan 1st  Dysuria several times this week along with urinary frequency. ? UTI. No vaginal d/c.   Assessment  1. Abdominal pain, chronic, right lower quadrant   2. Dysuria      Plan   Chronic RLQ pain w/o clear dx:  ? IBS. Discussed with patient. Trial of bentyl and refer to GI. Discussed bland diet and ok to use advil as needed. No red flag sxs identified today.   Check for UTI.   Follow up: prn  Visit date not found  Orders Placed This Encounter  Procedures  . GI Referral, [place], any  . UA POCT   Meds ordered this encounter  Medications  . dicyclomine (BENTYL) 10 MG capsule    Sig: Take 1 capsule (10 mg total) by mouth 4 (four) times daily -  before meals and at bedtime.    Dispense:  60 capsule    Refill:  2      I reviewed the patients updated PMH, FH, and SocHx.    Patient Active Problem List   Diagnosis Date Noted  . Fibroids 09/17/2018  . Endometriosis of pelvis 06/25/2017  . Subclinical hypothyroidism 06/25/2017  . Obesity (BMI 30-39.9) 06/25/2017  . 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 Allergic rhinitis 01/14/2016   Current Meds  Medication Sig  . b complex vitamins tablet Take 1 tablet by mouth daily.  . Bacillus Coagulans-Inulin (ALIGN PREBIOTIC-PROBIOTIC PO) Take by mouth.  . Calcium Carb-Cholecalciferol (CALCIUM 1000 + D PO) Take by mouth.  . Cetirizine HCl (ZYRTEC PO) Take 1 tablet by mouth daily.  . Cholecalciferol (VITAMIN D PO) Take 1 tablet by mouth daily.  Marland Kitchen ibuprofen (ADVIL,MOTRIN) 200 MG tablet Take 200 mg by mouth every 6 (six) hours as needed.  Marland Kitchen levothyroxine (SYNTHROID) 50 MCG tablet TAKE 1 TABLET BY MOUTH DAILY BEFORE BREAKFAST  . magnesium 30 MG tablet Take 30 mg by mouth 2 (two) times daily.  . Multiple Vitamins-Minerals (MULTIVITAMIN PO) Take 1 tablet by mouth daily.  . Omega-3 Fatty Acids (FISH OIL PO) Take 1 Dose by mouth daily.    Allergies: Patient is allergic to estrogens and zithromax [azithromycin]. Family History: Patient family history includes Alcohol abuse in her father; Alzheimer's disease in her maternal grandmother; Cancer in her maternal grandmother; Endocrine tumor in her father; Healthy in her sister; Hemachromatosis in her paternal grandmother; Ovarian cancer in her mother. Social History:  Patient  reports that she has quit smoking. She has never used smokeless tobacco. She reports current alcohol use. She reports that she does not  use drugs.  Review of Systems: Constitutional: Negative for fever malaise or anorexia Cardiovascular: negative for chest pain Respiratory: negative for SOB or persistent cough Gastrointestinal: positive for abdominal pain  Objective  Vitals: BP 122/74 (BP Location: Left Arm, Patient Position: Sitting, Cuff Size: Normal)   Pulse 77   Temp (!) 97.4 F (36.3 C) (Temporal)   Ht 5' 6.25" (1.683 m)   Wt 195 lb (88.5 kg)   SpO2 98%   BMI 31.24 kg/m  General: no acute distress , A&Ox3, appears well and comfortable HEENT: PEERL, conjunctiva normal, Oropharynx moist,neck is  supple Cardiovascular:  RRR without murmur or gallop.  Respiratory:  Good breath sounds bilaterally, CTAB with normal respiratory effort Gastrointestinal: soft, flat abdomen, normal active bowel sounds, no palpable masses, no hepatosplenomegaly, no appreciated hernias, no suprapubic ttp Skin:  Warm, no rashes  Office Visit on 01/05/2019  Component Date Value Ref Range Status  . Color, UA 01/05/2019 Yellow   Final  . Clarity, UA 01/05/2019 [place]   Final  . Glucose, UA 01/05/2019 Negative  Negative Final  . Bilirubin, UA 01/05/2019 Negative   Final  . Ketones, UA 01/05/2019 Positive   Final  . Spec Grav, UA 01/05/2019 >=1.030* 1.010 - 1.025 Final  . Blood, UA 01/05/2019 Negative   Final  . pH, UA 01/05/2019 5.5  5.0 - 8.0 Final  . Protein, UA 01/05/2019 Negative  Negative Final  . Urobilinogen, UA 01/05/2019 0.2  0.2 or 1.0 E.U./dL Final  . Nitrite, UA 01/05/2019 Negative   Final  . Leukocytes, UA 01/05/2019 Negative  Negative Final   Lab Results  Component Value Date   WBC 8.1 09/17/2018   HGB 14.5 09/17/2018   HCT 41.3 09/17/2018   MCV 90.9 09/17/2018   PLT 246.0 09/17/2018   Lab Results  Component Value Date   CREATININE 0.69 09/17/2018   BUN 23 09/17/2018   NA 140 09/17/2018   K 4.4 09/17/2018   CL 105 09/17/2018   CO2 26 09/17/2018      Commons side effects, risks, benefits, and alternatives for medications and treatment plan prescribed today were discussed, and the patient expressed understanding of the given instructions. Patient is instructed to call or message via MyChart if he/she has any questions or concerns regarding our treatment plan. No barriers to understanding were identified. We discussed Red Flag symptoms and signs in detail. Patient expressed understanding regarding what to do in case of urgent or emergency type symptoms.   Medication list was reconciled, printed and provided to the patient in AVS. Patient instructions and summary information was reviewed  with the patient as documented in the AVS. This note was prepared with assistance of Dragon voice recognition software. Occasional wrong-word or sound-a-like substitutions may have occurred due to the inherent limitations of voice recognition software  This visit occurred during the SARS-CoV-2 public health emergency.  Safety protocols were in place, including screening questions prior to the visit, additional usage of staff PPE, and extensive cleaning of exam room while observing appropriate contact time as indicated for disinfecting solutions.

## 2019-01-05 NOTE — Patient Instructions (Signed)
Please follow up if symptoms do not improve or as needed.   This pain may be due to IBS. Start the bentyl and gas x as needed. Eat a bland diet. You may use advil as needed as well.   I have referred you to Gastroenterology to discuss further. See below for some information on bland diets and  IBS.  I will let you know if your urine shows an infection.   Bland Diet A bland diet consists of foods that are often soft and do not have a lot of fat, fiber, or extra seasonings. Foods without fat, fiber, or seasoning are easier for the body to digest. They are also less likely to irritate your mouth, throat, stomach, and other parts of your digestive system. A bland diet is sometimes called a BRAT diet. What is my plan? Your health care provider or food and nutrition specialist (dietitian) may recommend specific changes to your diet to prevent symptoms or to treat your symptoms. These changes may include:  Eating small meals often.  Cooking food until it is soft enough to chew easily.  Chewing your food well.  Drinking fluids slowly.  Not eating foods that are very spicy, sour, or fatty.  Not eating citrus fruits, such as oranges and grapefruit. What do I need to know about this diet?  Eat a variety of foods from the bland diet food list.  Do not follow a bland diet longer than needed.  Ask your health care provider whether you should take vitamins or supplements. What foods can I eat? Grains  Hot cereals, such as cream of wheat. Rice. Bread, crackers, or tortillas made from refined white flour. Vegetables Canned or cooked vegetables. Mashed or boiled potatoes. Fruits  Bananas. Applesauce. Other types of cooked or canned fruit with the skin and seeds removed, such as canned peaches or pears. Meats and other proteins  Scrambled eggs. Creamy peanut butter or other nut butters. Lean, well-cooked meats, such as chicken or fish. Tofu. Soups or broths. Dairy Low-fat dairy products,  such as milk, cottage cheese, or yogurt. Beverages  Water. Herbal tea. Apple juice. Fats and oils Mild salad dressings. Canola or olive oil. Sweets and desserts Pudding. Custard. Fruit gelatin. Ice cream. The items listed above may not be a complete list of recommended foods and beverages. Contact a dietitian for more options. What foods are not recommended? Grains Whole grain breads and cereals. Vegetables Raw vegetables. Fruits Raw fruits, especially citrus, berries, or dried fruits. Dairy Whole fat dairy foods. Beverages Caffeinated drinks. Alcohol. Seasonings and condiments Strongly flavored seasonings or condiments. Hot sauce. Salsa. Other foods Spicy foods. Fried foods. Sour foods, such as pickled or fermented foods. Foods with high sugar content. Foods high in fiber. The items listed above may not be a complete list of foods and beverages to avoid. Contact a dietitian for more information. Summary  A bland diet consists of foods that are often soft and do not have a lot of fat, fiber, or extra seasonings.  Foods without fat, fiber, or seasoning are easier for the body to digest.  Check with your health care provider to see how long you should follow this diet plan. It is not meant to be followed for long periods. This information is not intended to replace advice given to you by your health care provider. Make sure you discuss any questions you have with your health care provider. Document Released: 05/02/2015 Document Revised: 02/06/2017 Document Reviewed: 02/06/2017 Elsevier Patient Education  2020  [employer]  Irritable Bowel Syndrome, Adult  Irritable bowel syndrome (IBS) is a group of symptoms that affects the organs responsible for digestion (gastrointestinal or GI tract). IBS is not one specific disease. To regulate how the GI tract works, the body sends signals back and forth between the intestines and the brain. If you have IBS, there may be a problem with  these signals. As a result, the GI tract does not function normally. The intestines may become more sensitive and overreact to certain things. This may be especially true when you eat certain foods or when you are under stress. There are four types of IBS. These may be determined based on the consistency of your stool (feces):  IBS with diarrhea.  IBS with constipation.  Mixed IBS.  Unsubtyped IBS. It is important to know which type of IBS you have. Certain treatments are more likely to be helpful for certain types of IBS. What are the causes? The exact cause of IBS is not known. What increases the risk? You may have a higher risk for IBS if you:  Are female.  Are younger than 18.  Have a family history of IBS.  Have a mental health condition, such as depression, anxiety, or post-traumatic stress disorder.  Have had a bacterial infection of your GI tract. What are the signs or symptoms? Symptoms of IBS vary from person to person. The main symptom is abdominal pain or discomfort. Other symptoms usually include one or more of the following:  Diarrhea, constipation, or both.  Abdominal swelling or bloating.  Feeling full after eating a small or regular-sized meal.  Frequent gas.  Mucus in the stool.  A feeling of having more stool left after a bowel movement. Symptoms tend to come and go. They may be triggered by stress, mental health conditions, or certain foods. How is this diagnosed? This condition may be diagnosed based on a physical exam, your medical history, and your symptoms. You may have tests, such as:  Blood tests.  Stool test.  X-rays.  CT scan.  Colonoscopy. This is a procedure in which your GI tract is viewed with a long, thin, flexible tube. How is this treated? There is no cure for IBS, but treatment can help relieve symptoms. Treatment depends on the type of IBS you have, and may include:  Changes to your diet, such as: ? Avoiding foods that cause  symptoms. ? Drinking more water. ? Following a low-FODMAP (fermentable oligosaccharides, disaccharides, monosaccharides, and polyols) diet for up to 6 weeks, or as told by your health care provider. FODMAPs are sugars that are hard for some people to digest. ? Eating more fiber. ? Eating medium-sized meals at the same times every day.  Medicines. These may include: ? Fiber supplements, if you have constipation. ? Medicine to control diarrhea (antidiarrheal medicines). ? Medicine to help control muscle tightening (spasms) in your GI tract (antispasmodic medicines). ? Medicines to help with mental health conditions, such as antidepressants or tranquilizers.  Talk therapy or counseling.  Working with a diet and nutrition specialist (dietitian) to help create a food plan that is right for you.  Managing your stress. Follow these instructions at home: Eating and drinking  Eat a healthy diet.  Eat medium-sized meals at about the same time every day. Do not eat large meals.  Gradually eat more fiber-rich foods. These include whole grains, fruits, and vegetables. This may be especially helpful if you have IBS with constipation.  Eat a diet low in  FODMAPs.  Drink enough fluid to keep your urine pale yellow.  Keep a journal of foods that seem to trigger symptoms.  Avoid foods and drinks that: ? Contain added sugar. ? Make your symptoms worse. Dairy products, caffeinated drinks, and carbonated drinks can make symptoms worse for some people. General instructions  Take over-the-counter and prescription medicines and supplements only as told by your health care provider.  Get enough exercise. Do at least 150 minutes of moderate-intensity exercise each week.  Manage your stress. Getting enough sleep and exercise can help you manage stress.  Keep all follow-up visits as told by your health care provider and therapist. This is important. Alcohol Use  Do not drink alcohol if: ? Your  health care provider tells you not to drink. ? You are pregnant, may be pregnant, or are planning to become pregnant.  If you drink alcohol, limit how much you have: ? 0-1 drink a day for women. ? 0-2 drinks a day for men.  Be aware of how much alcohol is in your drink. In the [place] one drink equals one typical bottle of beer (12 oz), one-half glass of wine (5 oz), or one shot of hard liquor (1 oz). Contact a health care provider if you have:  Constant pain.  Weight loss.  Difficulty or pain when swallowing.  Diarrhea that gets worse. Get help right away if you have:  Severe abdominal pain.  Fever.  Diarrhea with symptoms of dehydration, such as dizziness or dry mouth.  Bright red blood in your stool.  Stool that is black and tarry.  Abdominal swelling.  Vomiting that does not stop.  Blood in your vomit. Summary  Irritable bowel syndrome (IBS) is not one specific disease. It is a group of symptoms that affects digestion.  Your intestines may become more sensitive and overreact to certain things. This may be especially true when you eat certain foods or when you are under stress.  There is no cure for IBS, but treatment can help relieve symptoms. This information is not intended to replace advice given to you by your health care provider. Make sure you discuss any questions you have with your health care provider. Document Released: 01/08/2005 Document Revised: 01/01/2017 Document Reviewed: 01/01/2017 Elsevier Patient Education  2020 [employer].

## 2019-01-05 NOTE — Telephone Encounter (Signed)
See note

## 2019-01-05 NOTE — Telephone Encounter (Signed)
Lower right quadrant pain comes and goes, worsened over the last two days. Severe sharp pain that resolves on its on. Dealing with this for several years but over the last month, the pain has increased. Noticing patterns of the pain after she eats. No nausea/vomiting/diarrhea. Occasional constipation. Will use heat and occasional advil. Has not seen a GI. She thinkins it may be diverticulitis.Offered to make an appointment for later this week. Patient refused requesting an appointment before she leaves town tomorrow. No fever/no contacts/No travels. Warm transferred for appointment.   Reason for Disposition . Abdominal pain is a chronic symptom (recurrent or ongoing AND present > 4 weeks)  Answer Assessment - Initial Assessment Questions 1. LOCATION: "Where does it hurt?"     Right lower quadrant 2. RADIATION: "Does the pain shoot anywhere else?" (e.g., chest, back)    no 3. ONSET: "When did the pain begin?" (e.g., minutes, hours or days ago)      After eating 4. SUDDEN: "Gradual or sudden onset?"    Comes on sudden 5. PATTERN "Does the pain come and go, or is it constant?"    - If constant: "Is it getting better, staying the same, or worsening?"      (Note: Constant means the pain never goes away completely; most serious pain is constant and it progresses)     - If intermittent: "How long does it last?" "Do you have pain now?"     (Note: Intermittent means the pain goes away completely between bouts)    Comes and goes 6. SEVERITY: "How bad is the pain?"  (e.g., Scale 1-10; mild, moderate, or severe)   - MILD (1-3): doesn't interfere with normal activities, abdomen soft and not tender to touch    - MODERATE (4-7): interferes with normal activities or awakens from sleep, tender to touch    - SEVERE (8-10): excruciating pain, doubled over, unable to do any normal activities     Moderate to severe. 7. RECURRENT SYMPTOM: "Have you ever had this type of abdominal pain before?" If so, ask: "When  was the last time?" and "What happened that time?"      *has been occurring over 3 years but has worsened over the last month and week.8. CAUSE: "What do you think is causing the abdominal pain?"    Unsure but maybe diverticulitis 9. RELIEVING/AGGRAVATING FACTORS: "What makes it better or worse?" (e.g., movement, antacids, bowel movement)     Heat at times 10. OTHER SYMPTOMS: "Has there been any vomiting, diarrhea, constipation, or urine problems?"       Sometimes constipation, none of others 11. PREGNANCY: "Is there any chance you are pregnant?" "When was your last menstrual period?"      no  Protocols used: ABDOMINAL PAIN - [facility]

## 2019-01-06 ENCOUNTER — Encounter: Payer: Self-pay | Admitting: Gastroenterology

## 2019-02-10 ENCOUNTER — Ambulatory Visit: Payer: BC Managed Care – PPO | Admitting: Gastroenterology

## 2019-02-10 ENCOUNTER — Encounter: Payer: Self-pay | Admitting: Gastroenterology

## 2019-02-10 VITALS — BP 116/78 | HR 68 | Temp 98.3°F | Ht 62.25 in | Wt 199.1 lb

## 2019-02-10 DIAGNOSIS — K5900 Constipation, unspecified: Secondary | ICD-10-CM | POA: Diagnosis not present

## 2019-02-10 DIAGNOSIS — Z01818 Encounter for other preprocedural examination: Secondary | ICD-10-CM

## 2019-02-10 DIAGNOSIS — R1031 Right lower quadrant pain: Secondary | ICD-10-CM

## 2019-02-10 MED ORDER — SUPREP BOWEL PREP KIT 17.5-3.13-1.6 GM/177ML PO SOLN: 1.0000 | ORAL | 0 refills | Status: DC

## 2019-02-10 NOTE — Progress Notes (Signed)
HPI: This is a pleasant 39 year old woman who was referred to me by Leamon Arnt, MD  to evaluate chronic right lower quadrant pains, bloating, constipation.    She has had right lower quadrant pains intermittently for at least 2 years.  She tells me the pains occur once or twice a week, they are associate with bloating at the same time.  Her abdomen can be very tense during this time.  She seems to feel some muscular spasming after the pains set in in her right flank in her right back and her right upper thigh as well.  During the time she will have to urinate a lot.  Most reliably Gas-X 1 or 2 pills a day helps the pain.  Antispasm medicines do not seem to help the pain  The pains sometimes come on after walking for 10 or 15 minutes.  The pains can be somewhat positional.  She tried a clear liquid diet recently and that seemed to help the pain abate fairly quickly.  She has alternating constipation and loose stools.  Predominantly constipated however.  She describes pellet-like stools.  She will have to sit on the toilet for 20 to 30 minutes often.  She will to push and strain quite a bit.  She never sees blood in her stool.  She had severe endometriosis in the past and even required gynecologic surgery for it about 12 years ago.  Previously she over used NSAIDs.  812 ibuprofen daily up until at least 5 or 6 months ago.  Old Data Reviewed:  CT scan abdomen pelvis with IV and oral contrast August 2019 done for "chronic abdominal pain, primarily left-sided" was essentially normal except for fibroid uterus and a small fat-containing ventral hernia.     Review of systems: Pertinent positive and negative review of systems were noted in the above HPI section. All other review negative.   Past Medical History:  Diagnosis Date  . Depression   . Endometriosis   . Fibroids 09/17/2018  . Hypertension 2016   Intercranial Hypertension  . Intracranial hypertension   . Pseudotumor cerebri  induced by OCPs 05/22/2017  . Subclinical hypothyroidism   . Ventral hernia     Past Surgical History:  Procedure Laterality Date  . ABLATION ON ENDOMETRIOSIS    . ROBOTIC ASSISTED LAPAROSCOPIC PARTIAL CYSTECTOMY      Current Outpatient Medications  Medication Sig Dispense Refill  . b complex vitamins tablet Take 1 tablet by mouth daily.    . Calcium Carb-Cholecalciferol (CALCIUM 1000 + D PO) Take by mouth.    . calcium carbonate (TUMS - DOSED IN MG ELEMENTAL CALCIUM) 500 MG chewable tablet Chew 1 tablet by mouth as needed for indigestion or heartburn.    . Cetirizine HCl (ZYRTEC PO) Take 1 tablet by mouth daily.    Marland Kitchen ibuprofen (ADVIL,MOTRIN) 200 MG tablet Take 200 mg by mouth every 6 (six) hours as needed.    Marland Kitchen levothyroxine (SYNTHROID) 50 MCG tablet TAKE 1 TABLET BY MOUTH DAILY BEFORE BREAKFAST 30 tablet 2  . magnesium 30 MG tablet Take 30 mg by mouth 2 (two) times daily.    . methocarbamol (ROBAXIN) 500 MG tablet Take 500 mg by mouth as needed for muscle spasms.    . Multiple Vitamins-Minerals (MULTIVITAMIN PO) Take 1 tablet by mouth daily.    . Omega-3 Fatty Acids (FISH OIL PO) Take 1 Dose by mouth daily.    . Simethicone (GAS-X PO) Take 1 capsule by mouth as needed.    Marland Kitchen  dicyclomine (BENTYL) 10 MG capsule Take 1 capsule (10 mg total) by mouth 4 (four) times daily -  before meals and at bedtime. (Patient not taking: Reported on 02/10/2019) 60 capsule 2   No current facility-administered medications for this visit.    Allergies as of 02/10/2019 - Review Complete 02/10/2019  Allergen Reaction Noted  . Estrogens  06/25/2017  . Zithromax [azithromycin] Hives 05/21/2017    Family History  Problem Relation Age of Onset  . Ovarian cancer Mother   . Colon polyps Mother   . Endocrine tumor Father   . Alcohol abuse Father   . Pancreatic cancer Father   . Healthy Sister   . Alzheimer's disease Maternal Grandmother   . Cancer Maternal Grandmother   . Hemachromatosis Paternal  Grandmother     Social History   Socioeconomic History  . Marital status: Divorced    Spouse name: Not on file  . Number of children: 0  . Years of education: BA  . Highest education level: Not on file  Occupational History  . Not on file  Tobacco Use  . Smoking status: Former Research scientist (life sciences)  . Smokeless tobacco: Never Used  . Tobacco comment: socially  Substance and Sexual Activity  . Alcohol use: Yes    Comment: occasional  . Drug use: Never  . Sexual activity: Yes    Birth control/protection: Condom  Other Topics Concern  . Not on file  Social History Narrative   Lives w/ long term boyfriend   Caffeine use: 2-3 cups coffee per day   Left handed    Social Determinants of Health   Financial Resource Strain:   . Difficulty of Paying Living Expenses: Not on file  Food Insecurity:   . Worried About Charity fundraiser in the Last Year: Not on file  . Ran Out of Food in the Last Year: Not on file  Transportation Needs:   . Lack of Transportation (Medical): Not on file  . Lack of Transportation (Non-Medical): Not on file  Physical Activity:   . Days of Exercise per Week: Not on file  . Minutes of Exercise per Session: Not on file  Stress:   . Feeling of Stress : Not on file  Social Connections:   . Frequency of Communication with Friends and Family: Not on file  . Frequency of Social Gatherings with Friends and Family: Not on file  . Attends Religious Services: Not on file  . Active Member of Clubs or Organizations: Not on file  . Attends Archivist Meetings: Not on file  . Marital Status: Not on file  Intimate Partner Violence:   . Fear of Current or Ex-Partner: Not on file  . Emotionally Abused: Not on file  . Physically Abused: Not on file  . Sexually Abused: Not on file     Physical Exam: BP 116/78   Pulse 68   Temp 98.3 F (36.8 C)   Ht 5' 2.25" (1.581 m)   Wt 199 lb 2 oz (90.3 kg)   BMI 36.13 kg/m  Constitutional: generally  well-appearing Psychiatric: alert and oriented x3 Eyes: extraocular movements intact Mouth: oral pharynx moist, no lesions Neck: supple no lymphadenopathy Cardiovascular: heart regular rate and rhythm Lungs: clear to auscultation bilaterally Abdomen: soft, nontender, nondistended, no obvious ascites, no peritoneal signs, normal bowel sounds Extremities: no lower extremity edema bilaterally Skin: no lesions on visible extremities   Assessment and plan: 39 y.o. female with chronic right lower quadrant pain, constipation, bloating  Her  pains are very reliably improved or even prevented when she takes a Gas-X once or twice a day.  This fact really leads me to believe that this is unlikely anything serious especially neoplasm or inflammatory bowel disease.  Possibly this is related to abdominal adhesions, she did have severe endometriosis operated on 12 years ago.  Possibly this is constipation related discomforts.  I recommended that she start taking fiber supplements Citrucel on a daily basis for now and that we proceed with colonoscopy at her soonest convenience to exclude inflammatory bowel disease, neoplasm, significant stricturing disease.  I see no reason for any further blood tests or imaging studies  prior to then.    Please see the "Patient Instructions" section for addition details about the plan.   Owens Loffler, MD [facility] 02/10/2019, 11:15 AM  Cc: Leamon Arnt, MD

## 2019-02-10 NOTE — Patient Instructions (Addendum)
If you are age 40 or older, your body mass index should be between 23-30. Your Body mass index is 36.13 kg/m. If this is out of the aforementioned range listed, please consider follow up with your Primary Care Provider.  If you are age 53 or younger, your body mass index should be between 19-25. Your Body mass index is 36.13 kg/m. If this is out of the aformentioned range listed, please consider follow up with your Primary Care Provider.   You have been scheduled for a colonoscopy. Please follow written instructions given to you at your visit today.  Please pick up your prep supplies at the pharmacy within the next 1-3 days.  Please purchase the following medications over the counter and take as directed:  You can use over-the-counter Citrucel daily.  Stop:  Dicyclomine  Due to recent changes in healthcare laws, you may see the results of your imaging and laboratory studies on MyChart before your provider has had a chance to review them.  We understand that in some cases there may be results that are confusing or concerning to you. Not all laboratory results come back in the same time frame and the provider may be waiting for multiple results in order to interpret others.  Please give [name] 48 hours in order for your provider to thoroughly review all the results before contacting the office for clarification of your results.    Thank you, Dr Ardis Hughs

## 2019-02-12 ENCOUNTER — Telehealth: Payer: Self-pay | Admitting: Family Medicine

## 2019-02-12 NOTE — Telephone Encounter (Signed)
Patient called back in saying that she has been contacted by HR from work and they want her to be tested, she says that she will just go get tested.

## 2019-02-12 NOTE — Telephone Encounter (Signed)
Patient calls in saying her boyfriend was around a co-worker who tested positive today and they were around one another last Friday. Erika Garrett states that both people were wearing masks and so she is wanting to know if or when they should get tested.

## 2019-02-12 NOTE — Telephone Encounter (Signed)
Noted  

## 2019-02-23 ENCOUNTER — Encounter: Payer: Self-pay | Admitting: Gastroenterology

## 2019-02-23 ENCOUNTER — Other Ambulatory Visit: Payer: Self-pay | Admitting: Gastroenterology

## 2019-02-23 ENCOUNTER — Ambulatory Visit (INDEPENDENT_AMBULATORY_CARE_PROVIDER_SITE_OTHER): Payer: Self-pay

## 2019-02-23 ENCOUNTER — Other Ambulatory Visit: Payer: Self-pay

## 2019-02-23 DIAGNOSIS — Z1159 Encounter for screening for other viral diseases: Secondary | ICD-10-CM | POA: Diagnosis not present

## 2019-02-24 LAB — SARS CORONAVIRUS 2 (TAT 6-24 HRS): SARS Coronavirus 2: NEGATIVE

## 2019-02-25 ENCOUNTER — Encounter: Payer: Self-pay | Admitting: Gastroenterology

## 2019-02-25 ENCOUNTER — Other Ambulatory Visit: Payer: Self-pay

## 2019-02-25 ENCOUNTER — Ambulatory Visit (AMBULATORY_SURGERY_CENTER): Payer: BC Managed Care – PPO | Admitting: Gastroenterology

## 2019-02-25 VITALS — BP 104/66 | HR 54 | Temp 96.9°F | Resp 10 | Ht 62.0 in | Wt 199.0 lb

## 2019-02-25 DIAGNOSIS — K5900 Constipation, unspecified: Secondary | ICD-10-CM

## 2019-02-25 MED ORDER — SODIUM CHLORIDE 0.9 % IV SOLN: 500.0000 mL | Freq: Once | INTRAVENOUS | Status: DC

## 2019-02-25 NOTE — Op Note (Signed)
[facility] Patient Name: Erika Garrett Procedure Date: 02/25/2019 10:32 AM MRN: [id number] Endoscopist: Milus Banister , MD Age: 39 Referring MD:  Date of Birth: 1980/07/06 Gender: Female Account #: [id number] Procedure:                Colonoscopy Indications:              Chronic intermittent abdominal pains, usually right                            sided; chronic constipation Medicines:                Monitored Anesthesia Care Procedure:                Pre-Anesthesia Assessment:                           - Prior to the procedure, a History and Physical                            was performed, and patient medications and                            allergies were reviewed. The patient's tolerance of                            previous anesthesia was also reviewed. The risks                            and benefits of the procedure and the sedation                            options and risks were discussed with the patient.                            All questions were answered, and informed consent                            was obtained. Prior Anticoagulants: The patient has                            taken no previous anticoagulant or antiplatelet                            agents. ASA Grade Assessment: II - A patient with                            mild systemic disease. After reviewing the risks                            and benefits, the patient was deemed in                            satisfactory condition to undergo the procedure.  After obtaining informed consent, the colonoscope                            was passed under direct vision. Throughout the                            procedure, the patient's blood pressure, pulse, and                            oxygen saturations were monitored continuously. The                            Colonoscope was introduced through the anus and                            advanced to the the terminal  ileum. The colonoscopy                            was performed without difficulty. The patient                            tolerated the procedure well. The quality of the                            bowel preparation was good. The terminal ileum,                            ileocecal valve, appendiceal orifice, and rectum                            were photographed. Scope In: 10:36:14 AM Scope Out: 10:47:52 AM Scope Withdrawal Time: 0 hours 9 minutes 33 seconds  Total Procedure Duration: 0 hours 11 minutes 38 seconds  Findings:                 The terminal ileum appeared normal.                           The entire examined colon appeared normal on direct                            and retroflexion views. Complications:            No immediate complications. Estimated blood loss:                            None. Estimated Blood Loss:     Estimated blood loss: none. Impression:               - The examined portion of the ileum was normal.                           - The entire examined colon is normal on direct and                            retroflexion  views.                           - No specimens collected. Recommendation:           - Patient has a contact number available for                            emergencies. The signs and symptoms of potential                            delayed complications were discussed with the                            patient. Return to normal activities tomorrow.                            Written discharge instructions were provided to the                            patient.                           - Resume previous diet. Please start the daily OTC                            fiber supplement (citrucel orange powder) as we                            discussed in the office recently.                           - My office will call you to arrange an abdominal                            [name] to continue the workup for your chronic                             intermittent right sided abdominal pains.                           - Continue present medications.                           - Repeat colonoscopy in 10 years for screening. Milus Banister, MD 02/25/2019 10:51:04 AM This report has been signed electronically.

## 2019-02-25 NOTE — Patient Instructions (Signed)
YOU HAD AN ENDOSCOPIC PROCEDURE TODAY AT [facility]:   Refer to the procedure report that was given to you for any specific questions about what was found during the examination.  If the procedure report does not answer your questions, please call your gastroenterologist to clarify.  If you requested that your care partner not be given the details of your procedure findings, then the procedure report has been included in a sealed envelope for you to review at your convenience later.  YOU SHOULD EXPECT: Some feelings of bloating in the abdomen. Passage of more gas than usual.  Walking can help get rid of the air that was put into your GI tract during the procedure and reduce the bloating. If you had a lower endoscopy (such as a colonoscopy or flexible sigmoidoscopy) you may notice spotting of blood in your stool or on the toilet paper. If you underwent a bowel prep for your procedure, you may not have a normal bowel movement for a few days.  Please Note:  You might notice some irritation and congestion in your nose or some drainage.  This is from the oxygen used during your procedure.  There is no need for concern and it should clear up in a day or so.  SYMPTOMS TO REPORT IMMEDIATELY:   Following lower endoscopy (colonoscopy or flexible sigmoidoscopy):  Excessive amounts of blood in the stool  Significant tenderness or worsening of abdominal pains  Swelling of the abdomen that is new, acute  Fever of 100F or higher   For urgent or emergent issues, a gastroenterologist can be reached at any hour by calling [phone].   DIET:  We do recommend a small meal at first, but then you may proceed to your regular diet.  Drink plenty of fluids but you should avoid alcoholic beverages for 24 hours.  ACTIVITY:  You should plan to take it easy for the rest of today and you should NOT DRIVE or use heavy machinery until tomorrow (because of the sedation medicines used during the test).     FOLLOW UP: Our staff will call the number listed on your records 48-72 hours following your procedure to check on you and address any questions or concerns that you may have regarding the information given to you following your procedure. If we do not reach you, we will leave a message.  We will attempt to reach you two times.  During this call, we will ask if you have developed any symptoms of COVID 19. If you develop any symptoms (ie: fever, flu-like symptoms, shortness of breath, cough etc.) before then, please call [phone].  If you test positive for Covid 19 in the 2 weeks post procedure, please call and report this information to [name].    If any biopsies were taken you will be contacted by phone or by letter within the next 1-3 weeks.  Please call us at [phone] if you have not heard about the biopsies in 3 weeks.    SIGNATURES/CONFIDENTIALITY: You and/or your care partner have signed paperwork which will be entered into your electronic medical record.  These signatures attest to the fact that that the information above on your After Visit Summary has been reviewed and is understood.  Full responsibility of the confidentiality of this discharge information lies with you and/or your care-partner.   Resume medications. Start OTC fiber supplement-citrucel orange powder.office will contact you for abdominal  US.

## 2019-02-25 NOTE — Progress Notes (Signed)
Pt tolerated well. VSS. Awake and to recovery. 

## 2019-02-25 NOTE — Progress Notes (Signed)
[name] [name] 

## 2019-02-26 ENCOUNTER — Telehealth: Payer: Self-pay | Admitting: Gastroenterology

## 2019-02-26 DIAGNOSIS — R1031 Right lower quadrant pain: Secondary | ICD-10-CM

## 2019-02-26 NOTE — Telephone Encounter (Signed)
My office will call you to arrange an abdominal US to continue the workup for your chronic intermittent right sided abdominal pains.

## 2019-02-26 NOTE — Telephone Encounter (Signed)
You have been scheduled for an abdominal ultrasound at [facility] (1st floor of hospital) on 03/06/19 at 9 am. Please arrive 15 minutes prior to your appointment for registration. Make certain not to have anything to eat or drink 6 hours prior to your appointment. Should you need to reschedule your appointment, please contact radiology at [phone]. This test typically takes about 30 minutes to perform.  The patient has been notified of this information and all questions answered.

## 2019-02-26 NOTE — Telephone Encounter (Signed)
Pt called looking to schedule an [name] that Dr. Ardis Hughs wants pt to have.

## 2019-02-27 ENCOUNTER — Telehealth: Payer: Self-pay

## 2019-02-27 ENCOUNTER — Other Ambulatory Visit: Payer: Self-pay | Admitting: Family Medicine

## 2019-02-27 NOTE — Telephone Encounter (Signed)
  Follow up Call-  Call back number 02/25/2019  Post procedure Call Back phone  # [phone]  Permission to leave phone message Yes  Some recent data might be hidden     Left message

## 2019-02-27 NOTE — Telephone Encounter (Signed)
  Follow up Call-  Call back number 02/25/2019  Post procedure Call Back phone  # [phone]  Permission to leave phone message Yes  Some recent data might be hidden     Patient questions:  Do you have a fever, pain , or abdominal swelling? No. Pain Score  0 *  Have you tolerated food without any problems? Yes.    Have you been able to return to your normal activities? Yes.    Do you have any questions about your discharge instructions: Diet   No. Medications  No. Follow up visit  No.  Do you have questions or concerns about your Care? No.  Actions: * If pain score is 4 or above: No action needed, pain <4. 1. Have you developed a fever since your procedure? no  2.   Have you had an respiratory symptoms (SOB or cough) since your procedure? no  3.   Have you tested positive for COVID 19 since your procedure no  4.   Have you had any family members/close contacts diagnosed with the COVID 19 since your procedure?  no   If yes to any of these questions please route to Joylene John, RN and Alphonsa Gin, Therapist, sports.

## 2019-03-02 ENCOUNTER — Encounter: Payer: Self-pay | Admitting: Family Medicine

## 2019-03-02 DIAGNOSIS — G8929 Other chronic pain: Secondary | ICD-10-CM | POA: Insufficient documentation

## 2019-03-02 DIAGNOSIS — R1031 Right lower quadrant pain: Secondary | ICD-10-CM | POA: Insufficient documentation

## 2019-03-04 ENCOUNTER — Ambulatory Visit (HOSPITAL_COMMUNITY)
Admission: RE | Admit: 2019-03-04 | Discharge: 2019-03-04 | Disposition: A | Discharge status: Home / Self Care | Payer: BC Managed Care – PPO | Source: Ambulatory Visit | Attending: Gastroenterology | Admitting: Gastroenterology

## 2019-03-04 ENCOUNTER — Other Ambulatory Visit: Payer: Self-pay

## 2019-03-04 DIAGNOSIS — R1031 Right lower quadrant pain: Secondary | ICD-10-CM | POA: Diagnosis not present

## 2019-03-04 DIAGNOSIS — K7689 Other specified diseases of liver: Secondary | ICD-10-CM | POA: Diagnosis not present

## 2019-03-05 ENCOUNTER — Other Ambulatory Visit: Payer: Self-pay

## 2019-03-05 DIAGNOSIS — R160 Hepatomegaly, not elsewhere classified: Secondary | ICD-10-CM

## 2019-03-05 NOTE — Telephone Encounter (Signed)
Dr Ardis Hughs please advise, I have the pt scheduled for 2/23 for MRI.

## 2019-03-06 ENCOUNTER — Ambulatory Visit (HOSPITAL_COMMUNITY): Payer: BC Managed Care – PPO

## 2019-03-17 ENCOUNTER — Ambulatory Visit (HOSPITAL_COMMUNITY)
Admission: RE | Admit: 2019-03-17 | Discharge: 2019-03-17 | Disposition: A | Discharge status: Home / Self Care | Payer: BC Managed Care – PPO | Source: Ambulatory Visit | Attending: Gastroenterology | Admitting: Gastroenterology

## 2019-03-17 ENCOUNTER — Other Ambulatory Visit: Payer: Self-pay

## 2019-03-17 DIAGNOSIS — R160 Hepatomegaly, not elsewhere classified: Secondary | ICD-10-CM | POA: Diagnosis not present

## 2019-03-17 DIAGNOSIS — K449 Diaphragmatic hernia without obstruction or gangrene: Secondary | ICD-10-CM | POA: Diagnosis not present

## 2019-03-17 DIAGNOSIS — D1803 Hemangioma of intra-abdominal structures: Secondary | ICD-10-CM | POA: Diagnosis not present

## 2019-03-17 MED ORDER — GADOBUTROL 1 MMOL/ML IV SOLN
9.0000 mL | Freq: Once | INTRAVENOUS | Status: AC | PRN
  Administered 2019-03-17: 9 mL via INTRAVENOUS

## 2019-03-27 DIAGNOSIS — R102 Pelvic and perineal pain: Secondary | ICD-10-CM | POA: Diagnosis not present

## 2019-03-27 DIAGNOSIS — N809 Endometriosis, unspecified: Secondary | ICD-10-CM | POA: Diagnosis not present

## 2019-03-27 DIAGNOSIS — D219 Benign neoplasm of connective and other soft tissue, unspecified: Secondary | ICD-10-CM | POA: Diagnosis not present

## 2019-03-27 DIAGNOSIS — R198 Other specified symptoms and signs involving the digestive system and abdomen: Secondary | ICD-10-CM | POA: Diagnosis not present

## 2019-03-31 DIAGNOSIS — D251 Intramural leiomyoma of uterus: Secondary | ICD-10-CM | POA: Diagnosis not present

## 2019-03-31 DIAGNOSIS — N801 Endometriosis of ovary: Secondary | ICD-10-CM | POA: Diagnosis not present

## 2019-03-31 DIAGNOSIS — G8929 Other chronic pain: Secondary | ICD-10-CM | POA: Diagnosis not present

## 2019-03-31 DIAGNOSIS — R102 Pelvic and perineal pain: Secondary | ICD-10-CM | POA: Diagnosis not present

## 2019-03-31 DIAGNOSIS — M6248 Contracture of muscle, other site: Secondary | ICD-10-CM | POA: Diagnosis not present

## 2019-03-31 DIAGNOSIS — N809 Endometriosis, unspecified: Secondary | ICD-10-CM | POA: Diagnosis not present

## 2019-03-31 DIAGNOSIS — D219 Benign neoplasm of connective and other soft tissue, unspecified: Secondary | ICD-10-CM | POA: Diagnosis not present

## 2019-04-03 DIAGNOSIS — N809 Endometriosis, unspecified: Secondary | ICD-10-CM | POA: Diagnosis not present

## 2019-04-17 DIAGNOSIS — R102 Pelvic and perineal pain: Secondary | ICD-10-CM | POA: Diagnosis not present

## 2019-04-17 DIAGNOSIS — M6248 Contracture of muscle, other site: Secondary | ICD-10-CM | POA: Diagnosis not present

## 2019-04-27 DIAGNOSIS — R102 Pelvic and perineal pain: Secondary | ICD-10-CM | POA: Diagnosis not present

## 2019-04-27 DIAGNOSIS — M6248 Contracture of muscle, other site: Secondary | ICD-10-CM | POA: Diagnosis not present

## 2019-05-11 DIAGNOSIS — R102 Pelvic and perineal pain: Secondary | ICD-10-CM | POA: Diagnosis not present

## 2019-05-11 DIAGNOSIS — M6248 Contracture of muscle, other site: Secondary | ICD-10-CM | POA: Diagnosis not present

## 2019-05-26 DIAGNOSIS — R102 Pelvic and perineal pain: Secondary | ICD-10-CM | POA: Diagnosis not present

## 2019-05-26 DIAGNOSIS — M6248 Contracture of muscle, other site: Secondary | ICD-10-CM | POA: Diagnosis not present

## 2019-06-19 DIAGNOSIS — N809 Endometriosis, unspecified: Secondary | ICD-10-CM | POA: Diagnosis not present

## 2019-06-19 DIAGNOSIS — D219 Benign neoplasm of connective and other soft tissue, unspecified: Secondary | ICD-10-CM | POA: Diagnosis not present

## 2019-06-19 DIAGNOSIS — R102 Pelvic and perineal pain: Secondary | ICD-10-CM | POA: Diagnosis not present

## 2019-06-19 DIAGNOSIS — R198 Other specified symptoms and signs involving the digestive system and abdomen: Secondary | ICD-10-CM | POA: Diagnosis not present

## 2019-07-09 ENCOUNTER — Encounter: Payer: Self-pay | Admitting: Family Medicine

## 2019-07-09 ENCOUNTER — Other Ambulatory Visit: Payer: Self-pay

## 2019-07-09 ENCOUNTER — Ambulatory Visit (INDEPENDENT_AMBULATORY_CARE_PROVIDER_SITE_OTHER): Payer: BC Managed Care – PPO | Admitting: Family Medicine

## 2019-07-09 VITALS — BP 108/64 | HR 100 | Temp 97.7°F | Ht 62.0 in | Wt 198.0 lb

## 2019-07-09 DIAGNOSIS — N803 Endometriosis of pelvic peritoneum: Secondary | ICD-10-CM

## 2019-07-09 DIAGNOSIS — G8929 Other chronic pain: Secondary | ICD-10-CM

## 2019-07-09 DIAGNOSIS — G4763 Sleep related bruxism: Secondary | ICD-10-CM

## 2019-07-09 DIAGNOSIS — R1031 Right lower quadrant pain: Secondary | ICD-10-CM | POA: Diagnosis not present

## 2019-07-09 DIAGNOSIS — F4322 Adjustment disorder with anxiety: Secondary | ICD-10-CM

## 2019-07-09 DIAGNOSIS — IMO0002 Reserved for concepts with insufficient information to code with codable children: Secondary | ICD-10-CM

## 2019-07-09 MED ORDER — CYCLOBENZAPRINE HCL 10 MG PO TABS: 10.0000 mg | ORAL_TABLET | Freq: Every evening | ORAL | 2 refills | Status: DC | PRN

## 2019-07-09 NOTE — Progress Notes (Signed)
Subjective  CC:  Chief Complaint  Patient presents with  . Grinding Teeth    Dentist recomended that she come in for evaluation. Increased Jaw pain. uses mouth guard.   . Endometriosis    followed by [facility] working on scheduling surgery     HPI: Erika Garrett is a 39 y.o. female who presents to the office today to address the problems listed above in the chief complaint.  Nocturnal bruxism - dentist is concerned. Pt have right TMJ and jaw pain. No xrays. Wears a night guard. Concerned about etiology.   Anxiety - will have to have partial hysterectomy due to endometrosis and adhesions. Started back at work and it is very busy, stressful and lots of travel. Admits to chronic anxiety but never has sought treatment for it.   Depression screen [facility] 2/9 07/09/2019 09/17/2018 06/25/2017  Decreased Interest 1 0 0  Down, Depressed, Hopeless 0 0 0  PHQ - 2 Score 1 0 0  Altered sleeping 0 - 0  Tired, decreased energy 1 - 0  Change in appetite 0 - 0  Feeling bad or failure about yourself  0 - 0  Trouble concentrating 0 - 0  Moving slowly or fidgety/restless 0 - 0  Suicidal thoughts 0 - 0  PHQ-9 Score 2 - 0  Difficult doing work/chores Not difficult at all - Not difficult at all   GAD 7 : Generalized Anxiety Score 07/09/2019  Nervous, Anxious, on Edge 3  Control/stop worrying 1  Worry too much - different things 2  Trouble relaxing 3  Restless 0  Easily annoyed or irritable 2  Afraid - awful might happen 2  Total GAD 7 Score 13  Anxiety Difficulty Very difficult     Assessment  1. Bruxism, sleep-related   2. Adjustment reaction with anxiety   3. Endometriosis of pelvis   4. Abdominal pain, chronic, right lower quadrant      Plan   Nocturnal brusism:  Start flexeril. Continue naprosyn and night guard. Consider endodontist for xrays.   Anxiety: stress reaction/adjustment anxiety vs GAD. Today's visit was 30 minutes long. Greater than 50% of this time was devoted to face to face  counseling with the patient and coordination of care. We discussed her diagnosis, prognosis, treatment options and treatment plan is documented below.   Discussed treatment options including SSRIs. SNRI's and others and/or counseling. Pt will research options further and return if medications are wanted. She can schedule counseling as well.   For hysterectomy; hopeful that this will improve her abdominal pain.  Follow up: cpe  11/17/2019  No orders of the defined types were placed in this encounter.  Meds ordered this encounter  Medications  . cyclobenzaprine (FLEXERIL) 10 MG tablet    Sig: Take 1 tablet (10 mg total) by mouth at bedtime as needed (TMJ muscle spasm).    Dispense:  30 tablet    Refill:  2      I reviewed the patients updated PMH, FH, and SocHx.    Patient Active Problem List   Diagnosis Date Noted  . Abdominal pain, chronic, right lower quadrant 03/02/2019  . Fibroids 09/17/2018  . Endometriosis of pelvis 06/25/2017  . Subclinical hypothyroidism 06/25/2017  . Obesity (BMI 30-39.9) 06/25/2017  . 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 Allergic rhinitis 01/14/2016   Current Meds  Medication Sig  . b complex vitamins tablet Take 1 tablet by mouth daily.  . Calcium Carb-Cholecalciferol (CALCIUM 1000 + D PO) Take by  mouth.  . calcium carbonate (TUMS - DOSED IN MG ELEMENTAL CALCIUM) 500 MG chewable tablet Chew 1 tablet by mouth as needed for indigestion or heartburn.  . Cetirizine HCl (ZYRTEC PO) Take 1 tablet by mouth daily.  Marland Kitchen levothyroxine (SYNTHROID) 50 MCG tablet TAKE 1 TABLET BY MOUTH DAILY BEFORE BREAKFAST  . magnesium 30 MG tablet Take 30 mg by mouth 2 (two) times daily.  . Multiple Vitamins-Minerals (MULTIVITAMIN PO) Take 1 tablet by mouth daily.  . naproxen sodium (ANAPROX) 550 MG tablet Take 550 mg by mouth 2 (two) times daily with a meal.  . Omega-3 Fatty Acids (FISH OIL PO) Take 1 Dose by mouth daily.  . Simethicone (GAS-X PO) Take 1 capsule  by mouth as needed.    Allergies: Patient is allergic to estrogens, zithromax [azithromycin], and levonorgestrel-ethinyl estrad. Family History: Patient family history includes Alcohol abuse in her father; Alzheimer's disease in her maternal grandmother; Cancer in her maternal grandmother; Colon polyps in her mother; Endocrine tumor in her father; Healthy in her sister; Hemachromatosis in her paternal grandmother; Ovarian cancer in her mother; Pancreatic cancer in her father. Social History:  Patient  reports that she has quit smoking. She has never used smokeless tobacco. She reports current alcohol use. She reports that she does not use drugs.  Review of Systems: Constitutional: Negative for fever malaise or anorexia Cardiovascular: negative for chest pain Respiratory: negative for SOB or persistent cough Gastrointestinal: negative for abdominal pain  Objective  Vitals: BP 108/64   Pulse 100   Temp 97.7 F (36.5 C) (Temporal)   Ht 5\' 2"  (1.575 m)   Wt 198 lb (89.8 kg)   SpO2 97%   BMI 36.21 kg/m  General: no acute distress , A&Ox3      Commons side effects, risks, benefits, and alternatives for medications and treatment plan prescribed today were discussed, and the patient expressed understanding of the given instructions. Patient is instructed to call or message via MyChart if he/she has any questions or concerns regarding our treatment plan. No barriers to understanding were identified. We discussed Red Flag symptoms and signs in detail. Patient expressed understanding regarding what to do in case of urgent or emergency type symptoms.   Medication list was reconciled, printed and provided to the patient in AVS. Patient instructions and summary information was reviewed with the patient as documented in the AVS. This note was prepared with assistance of Dragon voice recognition software. Occasional wrong-word or sound-a-like substitutions may have occurred due to the inherent  limitations of voice recognition software  This visit occurred during the SARS-CoV-2 public health emergency.  Safety protocols were in place, including screening questions prior to the visit, additional usage of staff PPE, and extensive cleaning of exam room while observing appropriate contact time as indicated for disinfecting solutions.

## 2019-07-09 NOTE — Patient Instructions (Signed)
Please return if you would like to discuss further medication options for you anxiety  Consider counseling if needed.   If you have any questions or concerns, please don't hesitate to send me a message via MyChart or call the office at [phone]. Thank you for visiting with Erika Garrett today! It's our pleasure caring for you.  Please research SSRI's and write down questions you may have.    Stress, Adult Stress is a normal reaction to life events. Stress is what you feel when life demands more than you are used to, or more than you think you can handle. Some stress can be useful, such as studying for a test or meeting a deadline at work. Stress that occurs too often or for too long can cause problems. It can affect your emotional health and interfere with relationships and normal daily activities. Too much stress can weaken your body's defense system (immune system) and increase your risk for physical illness. If you already have a medical problem, stress can make it worse. What are the causes? All sorts of life events can cause stress. An event that causes stress for one person may not be stressful for another person. Major life events, whether positive or negative, commonly cause stress. Examples include:  Losing a job or starting a new job.  Losing a loved one.  Moving to a new town or home.  Getting married or divorced.  Having a baby.  Getting injured or sick. Less obvious life events can also cause stress, especially if they occur day after day or in combination with each other. Examples include:  Working long hours.  Driving in traffic.  Caring for children.  Being in debt.  Being in a difficult relationship. What are the signs or symptoms? Stress can cause emotional symptoms, including:  Anxiety. This is feeling worried, afraid, on edge, overwhelmed, or out of control.  Anger, including irritation or impatience.  Depression. This is feeling sad, down, helpless, or  guilty.  Trouble focusing, remembering, or making decisions. Stress can cause physical symptoms, including:  Aches and pains. These may affect your head, neck, back, stomach, or other areas of your body.  Tight muscles or a clenched jaw.  Low energy.  Trouble sleeping. Stress can cause unhealthy behaviors, including:  Eating to feel better (overeating) or skipping meals.  Working too much or putting off tasks.  Smoking, drinking alcohol, or using drugs to feel better. How is this diagnosed? Stress is diagnosed through an assessment by your health care provider. He or she may diagnose this condition based on:  Your symptoms and any stressful life events.  Your medical history.  Tests to rule out other causes of your symptoms. Depending on your condition, your health care provider may refer you to a specialist for further evaluation. How is this treated?  Stress management techniques are the recommended treatment for stress. Medicine is not typically recommended for the treatment of stress. Techniques to reduce your reaction to stressful life events include:  Stress identification. Monitor yourself for symptoms of stress and identify what causes stress for you. These skills may help you to avoid or prepare for stressful events.  Time management. Set your priorities, keep a calendar of events, and learn to say no. Taking these actions can help you avoid making too many commitments. Techniques for coping with stress include:  Rethinking the problem. Try to think realistically about stressful events rather than ignoring them or overreacting. Try to find the positives in a stressful situation  rather than focusing on the negatives.  Exercise. Physical exercise can release both physical and emotional tension. The key is to find a form of exercise that you enjoy and do it regularly.  Relaxation techniques. These relax the body and mind. The key is to find one or more that you enjoy  and use the techniques regularly. Examples include: ? Meditation, deep breathing, or progressive relaxation techniques. ? Yoga or tai chi. ? Biofeedback, mindfulness techniques, or journaling. ? Listening to music, being out in nature, or participating in other hobbies.  Practicing a healthy lifestyle. Eat a balanced diet, drink plenty of water, limit or avoid caffeine, and get plenty of sleep.  Having a strong support network. Spend time with family, friends, or other people you enjoy being around. Express your feelings and talk things over with someone you trust. Counseling or talk therapy with a mental health professional may be helpful if you are having trouble managing stress on your own. Follow these instructions at home: Lifestyle   Avoid drugs.  Do not use any products that contain nicotine or tobacco, such as cigarettes, e-cigarettes, and chewing tobacco. If you need help quitting, ask your health care provider.  Limit alcohol intake to no more than 1 drink a day for nonpregnant women and 2 drinks a day for men. One drink equals 12 oz of beer, 5 oz of wine, or 1 oz of hard liquor  Do not use alcohol or drugs to relax.  Eat a balanced diet that includes fresh fruits and vegetables, whole grains, lean meats, fish, eggs, and beans, and low-fat dairy. Avoid processed foods and foods high in added fat, sugar, and salt.  Exercise at least 30 minutes on 5 or more days each week.  Get 7-8 hours of sleep each night. General instructions   Practice stress management techniques as discussed with your health care provider.  Drink enough fluid to keep your urine clear or pale yellow.  Take over-the-counter and prescription medicines only as told by your health care provider.  Keep all follow-up visits as told by your health care provider. This is important. Contact a health care provider if:  Your symptoms get worse.  You have new symptoms.  You feel overwhelmed by your  problems and can no longer manage them on your own. Get help right away if:  You have thoughts of hurting yourself or others. If you ever feel like you may hurt yourself or others, or have thoughts about taking your own life, get help right away. You can go to your nearest emergency department or call:  Your local emergency services (911 in the [place]  A suicide crisis helpline, such as the [place] at 734-178-7108. This is open 24 hours a day. Summary  Stress is a normal reaction to life events. It can cause problems if it happens too often or for too long.  Practicing stress management techniques is the best way to treat stress.  Counseling or talk therapy with a mental health professional may be helpful if you are having trouble managing stress on your own. This information is not intended to replace advice given to you by your health care provider. Make sure you discuss any questions you have with your health care provider. Document Revised: 08/08/2018 Document Reviewed: 02/29/2016 Elsevier Patient Education  [place].

## 2019-07-10 DIAGNOSIS — M6248 Contracture of muscle, other site: Secondary | ICD-10-CM | POA: Diagnosis not present

## 2019-07-10 DIAGNOSIS — R102 Pelvic and perineal pain: Secondary | ICD-10-CM | POA: Diagnosis not present

## 2019-07-14 DIAGNOSIS — G932 Benign intracranial hypertension: Secondary | ICD-10-CM | POA: Diagnosis not present

## 2019-07-15 ENCOUNTER — Telehealth: Payer: Self-pay | Admitting: Neurology

## 2019-07-15 NOTE — Telephone Encounter (Signed)
I received a note from [facility] ophthalmology, Dr. Jola Schmidt, the patient was seen and appears to have no papilledema.

## 2019-07-23 ENCOUNTER — Ambulatory Visit (INDEPENDENT_AMBULATORY_CARE_PROVIDER_SITE_OTHER): Payer: BC Managed Care – PPO | Admitting: Family Medicine

## 2019-07-23 ENCOUNTER — Encounter: Payer: Self-pay | Admitting: Family Medicine

## 2019-07-23 ENCOUNTER — Other Ambulatory Visit: Payer: Self-pay

## 2019-07-23 VITALS — BP 110/62 | HR 58 | Temp 98.2°F | Ht 66.0 in | Wt 197.6 lb

## 2019-07-23 DIAGNOSIS — J301 Allergic rhinitis due to pollen: Secondary | ICD-10-CM

## 2019-07-23 DIAGNOSIS — H6983 Other specified disorders of Eustachian tube, bilateral: Secondary | ICD-10-CM

## 2019-07-23 MED ORDER — FLUTICASONE PROPIONATE 50 MCG/ACT NA SUSP: 1.0000 | Freq: Every day | NASAL | 6 refills | Status: DC

## 2019-07-23 NOTE — Progress Notes (Signed)
Subjective  CC:  Chief Complaint  Patient presents with  . Ear Pain    Lt ear x 4 days no fever just pain     HPI: Erika Garrett is a 39 y.o. female who presents to the office today to address the problems listed above in the chief complaint.  Erika Garrett presents with 4-day history of left ear pain.  She thinks she may have an infection.  Started after travel to [place] on airplane, described a pressure sensation.  Last night had intermittent sharp pains.  No fevers or URI symptoms.  No hearing loss.  Has had history of ear infections as an adult.  She is suffering from allergy symptoms with nasal congestion and occasional sneezing.  No sore throat. Assessment  1. ETD (Eustachian tube dysfunction), bilateral   2. Seasonal allergic rhinitis due to pollen      Plan     ETD: Education on prognosis given.  Continue Mucinex and Zyrtec and add Flonase daily.  Decongestant as needed.  Valsalva maneuver implants.  See handout  Seasonal allergic rhinitis and Flonase as noted above.  Follow up: As scheduled 11/17/2019  No orders of the defined types were placed in this encounter.  Meds ordered this encounter  Medications  . fluticasone (FLONASE) 50 MCG/ACT nasal spray    Sig: Place 1 spray into both nostrils daily.    Dispense:  16 g    Refill:  6      I reviewed the patients updated PMH, FH, and SocHx.    Patient Active Problem List   Diagnosis Date Noted  . Abdominal pain, chronic, right lower quadrant 03/02/2019  . Fibroids 09/17/2018  . Endometriosis of pelvis 06/25/2017  . Subclinical hypothyroidism 06/25/2017  . Obesity (BMI 30-39.9) 06/25/2017  . 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 Allergic rhinitis 01/14/2016   Current Meds  Medication Sig  . Calcium Carb-Cholecalciferol (CALCIUM 1000 + D PO) Take by mouth.  . calcium carbonate (TUMS - DOSED IN MG ELEMENTAL CALCIUM) 500 MG chewable tablet Chew 1 tablet by mouth as needed for indigestion or heartburn.  .  Cetirizine HCl (ZYRTEC PO) Take 1 tablet by mouth daily.  . cyclobenzaprine (FLEXERIL) 10 MG tablet Take 1 tablet (10 mg total) by mouth at bedtime as needed (TMJ muscle spasm).  Marland Kitchen levothyroxine (SYNTHROID) 50 MCG tablet TAKE 1 TABLET BY MOUTH DAILY BEFORE BREAKFAST  . magnesium 30 MG tablet Take 30 mg by mouth 2 (two) times daily.  . Multiple Vitamins-Minerals (MULTIVITAMIN PO) Take 1 tablet by mouth daily.  . naproxen sodium (ANAPROX) 550 MG tablet Take 550 mg by mouth 2 (two) times daily with a meal.  . Omega-3 Fatty Acids (FISH OIL PO) Take 1 Dose by mouth daily.  . Simethicone (GAS-X PO) Take 1 capsule by mouth as needed.    Allergies: Patient is allergic to estrogens, zithromax [azithromycin], and levonorgestrel-ethinyl estrad. Family History: Patient family history includes Alcohol abuse in her father; Alzheimer's disease in her maternal grandmother; Cancer in her maternal grandmother; Colon polyps in her mother; Endocrine tumor in her father; Healthy in her sister; Hemachromatosis in her paternal grandmother; Ovarian cancer in her mother; Pancreatic cancer in her father. Social History:  Patient  reports that she has quit smoking. She has never used smokeless tobacco. She reports current alcohol use. She reports that she does not use drugs.  Review of Systems: Constitutional: Negative for fever malaise or anorexia Cardiovascular: negative for chest pain Respiratory: negative for SOB or  persistent cough Gastrointestinal: negative for abdominal pain  Objective  Vitals: BP 110/62   Pulse (!) 58   Temp 98.2 F (36.8 C) (Temporal)   Ht 5\' 6"  (1.676 m)   Wt 197 lb 9.6 oz (89.6 kg)   LMP 07/22/2019   SpO2 97%   BMI 31.89 kg/m  General: no acute distress , A&Ox3 HEENT: PEERL, conjunctiva normal, neck is supple, left TM with opacity seen membranes without redness or bulging with fluid levels, normal external auditory canal, right TM is normal      Commons side effects, risks,  benefits, and alternatives for medications and treatment plan prescribed today were discussed, and the patient expressed understanding of the given instructions. Patient is instructed to call or message via MyChart if he/she has any questions or concerns regarding our treatment plan. No barriers to understanding were identified. We discussed Red Flag symptoms and signs in detail. Patient expressed understanding regarding what to do in case of urgent or emergency type symptoms.   Medication list was reconciled, printed and provided to the patient in AVS. Patient instructions and summary information was reviewed with the patient as documented in the AVS. This note was prepared with assistance of Dragon voice recognition software. Occasional wrong-word or sound-a-like substitutions may have occurred due to the inherent limitations of voice recognition software  This visit occurred during the SARS-CoV-2 public health emergency.  Safety protocols were in place, including screening questions prior to the visit, additional usage of staff PPE, and extensive cleaning of exam room while observing appropriate contact time as indicated for disinfecting solutions.

## 2019-07-23 NOTE — Patient Instructions (Signed)
Please follow up as scheduled for your next visit with me: 11/17/2019   If you have any questions or concerns, please don't hesitate to send me a message via MyChart or call the office at [phone]. Thank you for visiting with Erika Garrett today! It's our pleasure caring for you.   Eustachian Tube Dysfunction  Eustachian tube dysfunction refers to a condition in which a blockage develops in the narrow passage that connects the middle ear to the back of the nose (eustachian tube). The eustachian tube regulates air pressure in the middle ear by letting air move between the ear and nose. It also helps to drain fluid from the middle ear space. Eustachian tube dysfunction can affect one or both ears. When the eustachian tube does not function properly, air pressure, fluid, or both can build up in the middle ear. What are the causes? This condition occurs when the eustachian tube becomes blocked or cannot open normally. Common causes of this condition include:  Ear infections.  Colds and other infections that affect the nose, mouth, and throat (upper respiratory tract).  Allergies.  Irritation from cigarette smoke.  Irritation from stomach acid coming up into the esophagus (gastroesophageal reflux). The esophagus is the tube that carries food from the mouth to the stomach.  Sudden changes in air pressure, such as from descending in an airplane or scuba diving.  Abnormal growths in the nose or throat, such as: ? Growths that line the nose (nasal polyps). ? Abnormal growth of cells (tumors). ? Enlarged tissue at the back of the throat (adenoids). What increases the risk? You are more likely to develop this condition if:  You smoke.  You are overweight.  You are a child who has: ? Certain birth defects of the mouth, such as cleft palate. ? Large tonsils or adenoids. What are the signs or symptoms? Common symptoms of this condition include:  A feeling of fullness in the ear.  Ear  pain.  Clicking or popping noises in the ear.  Ringing in the ear.  Hearing loss.  Loss of balance.  Dizziness. Symptoms may get worse when the air pressure around you changes, such as when you travel to an area of high elevation, fly on an airplane, or go scuba diving. How is this diagnosed? This condition may be diagnosed based on:  Your symptoms.  A physical exam of your ears, nose, and throat.  Tests, such as those that measure: ? The movement of your eardrum (tympanogram). ? Your hearing (audiometry). How is this treated? Treatment depends on the cause and severity of your condition.  In mild cases, you may relieve your symptoms by moving air into your ears. This is called "popping the ears."  In more severe cases, or if you have symptoms of fluid in your ears, treatment may include: ? Medicines to relieve congestion (decongestants). ? Medicines that treat allergies (antihistamines). ? Nasal sprays or ear drops that contain medicines that reduce swelling (steroids). ? A procedure to drain the fluid in your eardrum (myringotomy). In this procedure, a small tube is placed in the eardrum to:  Drain the fluid.  Restore the air in the middle ear space. ? A procedure to insert a balloon device through the nose to inflate the opening of the eustachian tube (balloon dilation). Follow these instructions at home: Lifestyle  Do not do any of the following until your health care provider approves: ? Travel to high altitudes. ? Fly in airplanes. ? Work in a Pension scheme manager or  room. ? Scuba dive.  Do not use any products that contain nicotine or tobacco, such as cigarettes and e-cigarettes. If you need help quitting, ask your health care provider.  Keep your ears dry. Wear fitted earplugs during showering and bathing. Dry your ears completely after. General instructions  Take over-the-counter and prescription medicines only as told by your health care provider.  Use  techniques to help pop your ears as recommended by your health care provider. These may include: ? Chewing gum. ? Yawning. ? Frequent, forceful swallowing. ? Closing your mouth, holding your nose closed, and gently blowing as if you are trying to blow air out of your nose.  Keep all follow-up visits as told by your health care provider. This is important. Contact a health care provider if:  Your symptoms do not go away after treatment.  Your symptoms come back after treatment.  You are unable to pop your ears.  You have: ? A fever. ? Pain in your ear. ? Pain in your head or neck. ? Fluid draining from your ear.  Your hearing suddenly changes.  You become very dizzy.  You lose your balance. Summary  Eustachian tube dysfunction refers to a condition in which a blockage develops in the eustachian tube.  It can be caused by ear infections, allergies, inhaled irritants, or abnormal growths in the nose or throat.  Symptoms include ear pain, hearing loss, or ringing in the ears.  Mild cases are treated with maneuvers to unblock the ears, such as yawning or ear popping.  Severe cases are treated with medicines. Surgery may also be done (rare). This information is not intended to replace advice given to you by your health care provider. Make sure you discuss any questions you have with your health care provider. Document Revised: 04/30/2017 Document Reviewed: 04/30/2017 Elsevier Patient Education  [place].

## 2019-07-27 ENCOUNTER — Encounter: Payer: Self-pay | Admitting: Family Medicine

## 2019-07-28 MED ORDER — FLUNISOLIDE 25 MCG/ACT (0.025%) NA SOLN: 2.0000 | Freq: Every day | NASAL | 1 refills | Status: DC

## 2019-08-12 DIAGNOSIS — M542 Cervicalgia: Secondary | ICD-10-CM | POA: Diagnosis not present

## 2019-08-12 DIAGNOSIS — R102 Pelvic and perineal pain: Secondary | ICD-10-CM | POA: Diagnosis not present

## 2019-08-12 DIAGNOSIS — M6248 Contracture of muscle, other site: Secondary | ICD-10-CM | POA: Diagnosis not present

## 2019-08-13 ENCOUNTER — Ambulatory Visit (INDEPENDENT_AMBULATORY_CARE_PROVIDER_SITE_OTHER): Payer: BC Managed Care – PPO | Admitting: Allergy

## 2019-08-13 ENCOUNTER — Other Ambulatory Visit: Payer: Self-pay

## 2019-08-13 ENCOUNTER — Encounter: Payer: Self-pay | Admitting: Allergy

## 2019-08-13 VITALS — BP 130/88 | HR 88 | Temp 97.2°F | Resp 17 | Ht 66.0 in | Wt 198.8 lb

## 2019-08-13 DIAGNOSIS — L282 Other prurigo: Secondary | ICD-10-CM | POA: Insufficient documentation

## 2019-08-13 DIAGNOSIS — J310 Chronic rhinitis: Secondary | ICD-10-CM

## 2019-08-13 DIAGNOSIS — L298 Other pruritus: Secondary | ICD-10-CM

## 2019-08-13 NOTE — Progress Notes (Signed)
New Patient Note  RE: Erika Garrett MRN: [id number] DOB: 08-02-1980 Date of Office Visit: 08/13/2019  Referring provider: No ref. provider found Primary care provider: Leamon Arnt, MD  Chief Complaint: Urticaria (eye lids, around eyes and nose, above belly button )  History of Present Illness: I had the pleasure of seeing Erika Garrett for initial evaluation at the [facility] on 08/13/2019. She is a 39 y.o. female, who is self-referred here for the evaluation of hives.  Rash started about 2 years ago. Mainly occurs on the skin around her periorbital region. Describes them as dry, bumpy skin.  The rash waxes and wanes. Most recently had an outbreak 4 weeks ago.   The flares can last for a few weeks at a time. Associated symptoms include: abdominal bloating with diarrhea/constipation at times. Suspected triggers are unknown. Denies any fevers, chills, changes in medications, foods, personal care products or recent infections. She has tried the following therapies: benadryl with good benefit; topical creams do not help. Systemic steroids no.   Previous work up includes: saw dermatology and eye doctor. No prior patch testing.  She tried avoiding make up for 3 months with no benefit. She also switched her laundry detergent to free and clear.  Previous history of rash/hives: hives as a child.  Dietary History: patient has been eating other foods including limited milk, eggs, peanut, treenuts, sesame, shellfish, seafood, soy, wheat, meats, fruits and vegetables.  Patient travels for work quite a bit. Smokes occasionally.  Assessment and Plan: Erika Garrett is a 39 y.o. female with: Pruritic rash Pruritic rash mainly around her periorbital region.  Started 2 years ago and symptoms waxes and wanes.  Last major outbreak was 4 weeks ago.  Flares usually last a few weeks at a time.  No specific triggers noted.  Tried limiting certain foods, changed her personal care products with no  benefit.  Tried Benadryl with some benefit.  Saw dermatology and her eye doctor for this in the past.  Discussed with patient given distribution more concerning for contact dermatitis rather than any type of food or environmental allergies.  Patient would still like to get tested for food and environmental allergies however declined skin prick testing and wants to get blood work first.  Get bloodwork as below.  May use over the counter antihistamines such as Zyrtec (cetirizine), Claritin (loratadine), Allegra (fexofenadine), or Xyzal (levocetirizine) daily as needed.  May take additional Benadryl 25-50mg  every 8 hours as needed.  Take pictures of the rash.  See below for proper skin care.   If bloodwork is unremarkable then recommend patch testing next.   Encouraged smoking cessation as well.  Return in about 3 months (around 11/13/2019).  Lab Orders     Allergens w/Total IgE Area 2     IgE Food w/Component Reflex II  Other allergy screening: Asthma: no Medication allergy: yes  Zithromax - hives Estrogen - due to [place] Hymenoptera allergy: no History of recurrent infections suggestive of immunodeficency: no  Diagnostics: Skin Testing: declines and prefers to get bloodwork first.  Past Medical History: Patient Active Problem List   Diagnosis Date Noted  . Pruritic rash 08/13/2019  . Other pruritus 08/13/2019  . Chronic rhinitis 08/13/2019  . Abdominal pain, chronic, right lower quadrant 03/02/2019  . Fibroids 09/17/2018  . Endometriosis of pelvis 06/25/2017  . Subclinical hypothyroidism 06/25/2017  . Obesity (BMI 30-39.9) 06/25/2017  . 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 Allergic rhinitis 01/14/2016  Past Medical History:  Diagnosis Date  . Allergy   . Anxiety   . Depression   . Endometriosis   . Fibroids 09/17/2018  . Hyperlipidemia    hx of, no medications  . Hypertension 2016   Intercranial Hypertension  . Intracranial hypertension   .  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 Subclinical hypothyroidism   . Ventral hernia    Past Surgical History: Past Surgical History:  Procedure Laterality Date  . ABLATION ON ENDOMETRIOSIS    . ROBOTIC ASSISTED LAPAROSCOPIC PARTIAL CYSTECTOMY     Medication List:  Current Outpatient Medications  Medication Sig Dispense Refill  . Calcium Carb-Cholecalciferol (CALCIUM 1000 + D PO) Take by mouth.    . calcium carbonate (TUMS - DOSED IN MG ELEMENTAL CALCIUM) 500 MG chewable tablet Chew 1 tablet by mouth as needed for indigestion or heartburn.    . Cetirizine HCl (ZYRTEC PO) Take 1 tablet by mouth daily.    . cyclobenzaprine (FLEXERIL) 10 MG tablet Take 1 tablet (10 mg total) by mouth at bedtime as needed (TMJ muscle spasm). 30 tablet 2  . levothyroxine (SYNTHROID) 50 MCG tablet TAKE 1 TABLET BY MOUTH DAILY BEFORE BREAKFAST 90 tablet 3  . magnesium 30 MG tablet Take 30 mg by mouth 2 (two) times daily.    . Multiple Vitamins-Minerals (MULTIVITAMIN PO) Take 1 tablet by mouth daily.    . naproxen sodium (ANAPROX) 550 MG tablet Take 550 mg by mouth 2 (two) times daily with a meal.    . Omega-3 Fatty Acids (FISH OIL PO) Take 1 Dose by mouth daily.    . Simethicone (GAS-X PO) Take 1 capsule by mouth as needed.    Marland Kitchen b complex vitamins tablet Take 1 tablet by mouth daily. (Patient not taking: Reported on 08/13/2019)    . flunisolide (NASALIDE) 25 MCG/ACT (0.025%) SOLN Place 2 sprays into the nose daily at 6 (six) AM. (Patient not taking: Reported on 08/13/2019) 25 mL 1   No current facility-administered medications for this visit.   Allergies: Allergies  Allergen Reactions  . Estrogens     BIH  . Zithromax [Azithromycin] Hives  . Levonorgestrel-Ethinyl Estrad Other (See Comments)   Social History: Social History   Socioeconomic History  . Marital status: Single    Spouse name: Not on file  . Number of children: 0  . Years of education: BA  . Highest education level: Not on file   Occupational History  . Not on file  Tobacco Use  . Smoking status: Current Some Day Smoker  . Smokeless tobacco: Never Used  . Tobacco comment: socially  Vaping Use  . Vaping Use: Never used  Substance and Sexual Activity  . Alcohol use: Yes    Comment: occasional  . Drug use: Never  . Sexual activity: Yes    Birth control/protection: Condom  Other Topics Concern  . Not on file  Social History Narrative   Lives w/ long term boyfriend   Caffeine use: 2-3 cups coffee per day   Left handed    Social Determinants of Health   Financial Resource Strain:   . Difficulty of Paying Living Expenses:   Food Insecurity:   . Worried About Charity fundraiser in the Last Year:   . Arboriculturist in the Last Year:   Transportation Needs:   . Film/video editor (Medical):   Marland Kitchen Lack of Transportation (Non-Medical):   Physical Activity:   . Days of Exercise per Week:   .  Minutes of Exercise per Session:   Stress:   . Feeling of Stress :   Social Connections:   . Frequency of Communication with Friends and Family:   . Frequency of Social Gatherings with Friends and Family:   . Attends Religious Services:   . Active Member of Clubs or Organizations:   . Attends Archivist Meetings:   Marland Kitchen Marital Status:    Lives in a house. Smoking: occasionally Occupation: Teacher, adult education HistoryFreight forwarder in the house: no Carpet in the family room: yes Carpet in the bedroom: yes Heating: gas and electric Cooling: central Pet: no  Family History: Family History  Problem Relation Age of Onset  . Ovarian cancer Mother   . Colon polyps Mother   . Endocrine tumor Father   . Alcohol abuse Father   . Pancreatic cancer Father   . Healthy Sister   . Alzheimer's disease Maternal Grandmother   . Cancer Maternal Grandmother   . Hemachromatosis Paternal Grandmother   . Colon cancer Neg Hx   . Esophageal cancer Neg Hx   . Stomach cancer Neg Hx     . Rectal cancer Neg Hx    Problem                               Relation Asthma                                   No  Eczema                                No  Food allergy                          No  Allergic rhino conjunctivitis     No   Review of Systems  Constitutional: Negative for appetite change, chills, fever and unexpected weight change.  HENT: Negative for congestion and rhinorrhea.   Eyes: Negative for itching.  Respiratory: Negative for cough, chest tightness, shortness of breath and wheezing.   Cardiovascular: Negative for chest pain.  Gastrointestinal: Negative for abdominal pain.  Genitourinary: Negative for difficulty urinating.  Skin: Positive for rash.  Neurological: Negative for headaches.   Objective: BP 130/88 (BP Location: Right Arm, Patient Position: Sitting, Cuff Size: Normal)   Pulse 88   Temp (!) 97.2 F (36.2 C) (Temporal)   Resp 17   Ht 5\' 6"  (1.676 m)   Wt 198 lb 12.8 oz (90.2 kg)   LMP 07/22/2019   SpO2 99%   BMI 32.09 kg/m  Body mass index is 32.09 kg/m. Physical Exam Vitals and nursing note reviewed.  Constitutional:      Appearance: Normal appearance. She is well-developed.  HENT:     Head: Normocephalic and atraumatic.     Right Ear: Tympanic membrane and external ear normal.     Left Ear: External ear normal.     Ears:     Comments: Scarring on left TM    Nose: Rhinorrhea present. No congestion.     Mouth/Throat:     Mouth: Mucous membranes are moist.     Pharynx: Oropharynx is clear.  Eyes:     Conjunctiva/sclera: Conjunctivae normal.  Cardiovascular:     Rate and Rhythm: Normal rate  and regular rhythm.     Heart sounds: Normal heart sounds. No murmur heard.  No friction rub. No gallop.   Pulmonary:     Effort: Pulmonary effort is normal.     Breath sounds: Normal breath sounds. No wheezing, rhonchi or rales.  Musculoskeletal:     Cervical back: Neck supple.  Skin:    General: Skin is warm.     Findings: No rash.   Neurological:     Mental Status: She is alert and oriented to person, place, and time.  Psychiatric:        Behavior: Behavior normal.    The plan was reviewed with the patient/family, and all questions/concerned were addressed.  It was my pleasure to see Erika Garrett today and participate in her care. Please feel free to contact me with any questions or concerns.  Sincerely,  Rexene Alberts, DO Allergy & Immunology  [facility] office: [phone] [facility] office: [place] office: [phone]

## 2019-08-13 NOTE — Patient Instructions (Addendum)
Rash: . Get bloodwork:  o We are ordering labs, so please allow 1-2 weeks for the results to come back. o With the newly implemented Cures Act, the labs might be visible to you at the same time that they become visible to me. However, I will not address the results until all of the results are back, so please be patient.   May use over the counter antihistamines such as Zyrtec (cetirizine), Claritin (loratadine), Allegra (fexofenadine), or Xyzal (levocetirizine) daily as needed.  May take additional Benadryl 25-50mg  every 8 hours as needed.  Take pictures of the rash.  See below for proper skin care.   If bloodwork is unremarkable then recommend patch testing next.   Follow up in 3 months or sooner if needed.   Patches are best placed on Monday with return to office on Wednesday and Friday of same week for readings.  Patches once placed should not get wet.  You do not have to stop any medications for patch testing but should not be on oral prednisone. You can schedule a patch testing visit when convenient for your schedule.    Marland Kitchen Patches placed today. . Please avoid strenuous physical activities and do not get the patches on the back wet. No showering until final patch reading done. Faythe Ghee to take antihistamines for itching but avoid placing any creams on the back where the patches are. . We will remove the patches on Wednesday and will do our initial read. . Then you will come back on Friday for a final read.   Skin care recommendations  Bath time: . Always use lukewarm water. AVOID very hot or cold water. Marland Kitchen Keep bathing time to 5-10 minutes. . Do NOT use bubble bath. . Use a mild soap and use just enough to wash the dirty areas. . Do NOT scrub skin vigorously.  . After bathing, pat dry your skin with a towel. Do NOT rub or scrub the skin.  Moisturizers and prescriptions:  . ALWAYS apply moisturizers immediately after bathing (within 3 minutes). This helps to lock-in  moisture. . Use the moisturizer several times a day over the whole body. Kermit Balo summer moisturizers include: Aveeno, CeraVe, Cetaphil. Kermit Balo winter moisturizers include: Aquaphor, Vaseline, Cerave, Cetaphil, Eucerin, Vanicream. . When using moisturizers along with medications, the moisturizer should be applied about one hour after applying the medication to prevent diluting effect of the medication or moisturize around where you applied the medications. When not using medications, the moisturizer can be continued twice daily as maintenance.  Laundry and clothing: . Avoid laundry products with added color or perfumes. . Use unscented hypo-allergenic laundry products such as Tide free, Cheer free & gentle, and All free and clear.  . If the skin still seems dry or sensitive, you can try double-rinsing the clothes. . Avoid tight or scratchy clothing such as wool. . Do not use fabric softeners or dyer sheets.

## 2019-08-13 NOTE — Assessment & Plan Note (Signed)
Pruritic rash mainly around her periorbital region.  Started 2 years ago and symptoms waxes and wanes.  Last major outbreak was 4 weeks ago.  Flares usually last a few weeks at a time.  No specific triggers noted.  Tried limiting certain foods, changed her personal care products with no benefit.  Tried Benadryl with some benefit.  Saw dermatology and her eye doctor for this in the past.  Discussed with patient given distribution more concerning for contact dermatitis rather than any type of food or environmental allergies.  Patient would still like to get tested for food and environmental allergies however declined skin prick testing and wants to get blood work first.  Get bloodwork as below.  May use over the counter antihistamines such as Zyrtec (cetirizine), Claritin (loratadine), Allegra (fexofenadine), or Xyzal (levocetirizine) daily as needed.  May take additional Benadryl 25-50mg  every 8 hours as needed.  Take pictures of the rash.  See below for proper skin care.   If bloodwork is unremarkable then recommend patch testing next.   Encouraged smoking cessation as well.

## 2019-08-16 LAB — ALLERGENS W/TOTAL IGE AREA 2
Alternaria Alternata IgE: <0.1 kU/L
Aspergillus Fumigatus IgE: <0.1 kU/L
Bermuda Grass IgE: <0.1 kU/L
Cat Dander IgE: <0.1 kU/L
Cedar, Mountain IgE: <0.1 kU/L
Cladosporium Herbarum IgE: <0.1 kU/L
Cockroach, German IgE: <0.1 kU/L
Common Silver Birch IgE: <0.1 kU/L
Cottonwood IgE: <0.1 kU/L
D Farinae IgE: <0.1 kU/L
D Pteronyssinus IgE: <0.1 kU/L
Dog Dander IgE: <0.1 kU/L
Elm, American IgE: <0.1 kU/L
IgE (Immunoglobulin E), Serum: 5 IU/mL — ABNORMAL LOW (ref 6–495)
Johnson Grass IgE: <0.1 kU/L
Maple/Box Elder IgE: <0.1 kU/L
Mouse Urine IgE: <0.1 kU/L
Oak, White IgE: <0.1 kU/L
Pecan, Hickory IgE: <0.1 kU/L
Penicillium Chrysogen IgE: <0.1 kU/L
Pigweed, Rough IgE: <0.1 kU/L
Ragweed, Short IgE: <0.1 kU/L
Sheep Sorrel IgE Qn: <0.1 kU/L
Timothy Grass IgE: <0.1 kU/L
White Mulberry IgE: <0.1 kU/L

## 2019-08-16 LAB — IGE FOOD W/COMPONENT REFLEX II
Allergen Corn, IgE: <0.1 kU/L
Clam IgE: <0.1 kU/L
Codfish IgE: <0.1 kU/L
F001-IgE Egg White: <0.1 kU/L
F002-IgE Milk: <0.1 kU/L
F017-IgE Hazelnut (Filbert): <0.1 kU/L
F018-IgE Brazil Nut: <0.1 kU/L
F020-IgE Almond: <0.1 kU/L
F202-IgE Cashew Nut: <0.1 kU/L
F203-IgE Pistachio Nut: <0.1 kU/L
F256-IgE Walnut: <0.1 kU/L
Macadamia Nut, IgE: <0.1 kU/L
Peanut, IgE: <0.1 kU/L
Pecan Nut IgE: <0.1 kU/L
Scallop IgE: <0.1 kU/L
Sesame Seed IgE: <0.1 kU/L
Shrimp IgE: <0.1 kU/L
Soybean IgE: <0.1 kU/L
Wheat IgE: <0.1 kU/L

## 2019-08-17 ENCOUNTER — Encounter: Payer: Self-pay | Admitting: Allergy

## 2019-08-17 DIAGNOSIS — N809 Endometriosis, unspecified: Secondary | ICD-10-CM | POA: Diagnosis not present

## 2019-08-18 ENCOUNTER — Encounter: Payer: Self-pay | Admitting: Family Medicine

## 2019-08-19 DIAGNOSIS — M6248 Contracture of muscle, other site: Secondary | ICD-10-CM | POA: Diagnosis not present

## 2019-08-19 DIAGNOSIS — M542 Cervicalgia: Secondary | ICD-10-CM | POA: Diagnosis not present

## 2019-08-19 DIAGNOSIS — R102 Pelvic and perineal pain: Secondary | ICD-10-CM | POA: Diagnosis not present

## 2019-08-23 DIAGNOSIS — L239 Allergic contact dermatitis, unspecified cause: Secondary | ICD-10-CM | POA: Insufficient documentation

## 2019-08-23 NOTE — Progress Notes (Signed)
   Follow Up Note  RE: Erika Garrett MRN: [id number] DOB: [birth year] Date of Office Visit: 08/24/2019  Referring provider: Leamon Arnt, MD Primary care provider: Leamon Arnt, MD  History of Present Illness: I had the pleasure of seeing Erika Garrett for a follow up visit at the [facility] [place] [facility] [place] on 08/24/2019. She is a 39 y.o. female, who is being followed for pruritic rash. Today she is here for patch test placement, given suspected history of contact dermatitis.   She wonders if she is allergic to nickel. Over the weekend she put her key in her sports bra and she had a red area where it was. When asked if she has problems with a blue jean button causing redness or itching she reports that her underwear usually covers this area.  Diagnostics: TRUE Test patches placed.   Assessment and Plan: Erika Garrett is a 39 y.o. female with: Abdominal pain, chronic, right lower quadrant Past history - Pruritic rash mainly around her periorbital region.  Started 2 years ago and symptoms waxes and wanes.  Last major outbreak was 4 weeks ago.  Flares usually last a few weeks at a time.  No specific triggers noted.  Tried limiting certain foods, changed her personal care products with no benefit.  Tried Benadryl with some benefit.  Saw dermatology and her eye doctor for this in the past. Discussed with patient given distribution more concerning for contact dermatitis rather than any type of food or environmental allergies.  Patient would still like to get tested for food and environmental allergies however declined skin prick testing and wants to get blood work first. Interim history - TRUE patches placed today.   The patient was instructed regarding proper care of the patches for the next 48 hours. Do not get patches wet - avoid showering until the next visit. Do not engage in vigorous physical activity.  Patient will follow up in 48 hours and 96 hours for patch readings.  It was my pleasure to  see Erika Garrett today and participate in her care. Please feel free to contact me with any questions or concerns.  Sincerely,  Althea Charon, FNP [facility] [place] [facility]  I have provided oversight concerning Althea Charon FNP evaluation and treatment of this patient's heath issues addressed during today's encounter. I agree with the assessment and plan as outlined in the note above.  Rexene Alberts, DO [facility] [place] [facility] Allergy & Immunology  [facility] [place] office: [phone] [facility] office: [place] office: [phone]

## 2019-08-24 ENCOUNTER — Ambulatory Visit: Payer: BC Managed Care – PPO | Admitting: Allergy

## 2019-08-24 ENCOUNTER — Encounter: Payer: Self-pay | Admitting: Allergy

## 2019-08-24 ENCOUNTER — Other Ambulatory Visit: Payer: Self-pay

## 2019-08-24 VITALS — BP 118/84 | HR 77 | Temp 98.2°F | Ht 66.0 in | Wt 201.2 lb

## 2019-08-24 DIAGNOSIS — L239 Allergic contact dermatitis, unspecified cause: Secondary | ICD-10-CM

## 2019-08-24 DIAGNOSIS — G8929 Other chronic pain: Secondary | ICD-10-CM

## 2019-08-24 NOTE — Assessment & Plan Note (Signed)
Past history - Pruritic rash mainly around her periorbital region.  Started 2 years ago and symptoms waxes and wanes.  Last major outbreak was 4 weeks ago.  Flares usually last a few weeks at a time.  No specific triggers noted.  Tried limiting certain foods, changed her personal care products with no benefit.  Tried Benadryl with some benefit.  Saw dermatology and her eye doctor for this in the past. Discussed with patient given distribution more concerning for contact dermatitis rather than any type of food or environmental allergies.  Patient would still like to get tested for food and environmental allergies however declined skin prick testing and wants to get blood work first. Interim history - TRUE patches placed today.

## 2019-08-25 NOTE — Progress Notes (Signed)
   Follow Up Note  RE: Erika Garrett MRN: [id number] DOB: 09/12/1980 Date of Office Visit: 08/26/2019  Referring provider: Andy, Camille L, MD Primary care provider: Andy, Camille L, MD  History of Present Illness: I had the pleasure of seeing Erika Garrett for a follow up visit at the [facility] [place] on 08/26/2019. She is a 38 y.o. female, who is being followed for rash. Today she is here for initial patch test interpretation, given suspected history of contact dermatitis.   Diagnostics:  TRUE TEST 48 hour reading:  Borderline positive to Carba Mix and gold.   T.R.U.E. Test - 08/26/19 1100    Time Antigen Placed 1100    Manufacturer [employer]    Lot # [id number]    Location Back    Number of Test 36    Reading Interval Day 1;Day 3;Day 5    Panel Panel 1;Panel 2;Panel 3    1. Nickel Sulfate 0    2. Wool Alcohols 0    3. Neomycin Sulfate 0    4. Potassium Dichromate 0    5. Caine Mix 0    6. Fragrance Mix 0    7. Colophony 0    8. Paraben Mix 0    9. Negative Control 0    10. Balsam of [place] 0    11. Ethylenediamine Dihydrochloride 0    12. Cobalt Dichloride 0    13. p-tert Butylphenol Formaldehyde Resin 0    14. Epoxy Resin 0    15. Carba Mix --   +/-   16.  Black Rubber Mix 0    17. Cl+ Me-Isothiazolinone 0    18. Quaternium-15 0    19. Methyldibromo Glutaronitrile 0    20. p-Phenylenediamine 0    21. Formaldehyde 0    22. Mercapto Mix 0    23. Thimerosal 0    24. Thiuram Mix 0    25. Diazolidinyl Urea 0    26. Quinoline Mix 0    27. Tixocortol-21-Pivalate 0    28. Gold Sodium Thiosulfate --   +/-   29. Imidazolidinyl Urea 0    30. Budesonide 0    31. Hydrocortisone-17-Butyrate 0    32. Mercaptobenzothiazole 0    33. Bacitracin 0    34. Parthenolide 0    35. Disperse Blue 106 0    36. 2-Bromo-2-Nitropropane-1,3-diol 0           Assessment and Plan: Erika Garrett is a 38 y.o. female with: Allergic contact dermatitis 48 hour TRUE test reading:  Borderline  positive to Carba Mix and gold.   The patient has been provided detailed information regarding the substances she is sensitive to, as well as products containing the substances.   Return in about 2 days (around 08/28/2019) for Patch reading.  It was my pleasure to see Erika Garrett today and participate in her care. Please feel free to contact me with any questions or concerns.  Sincerely,  Yoon Kim, DO Allergy & Immunology  [facility] [place] office: [phone] [place] office: [phone] [place] office: [phone]  

## 2019-08-26 ENCOUNTER — Other Ambulatory Visit: Payer: Self-pay

## 2019-08-26 ENCOUNTER — Encounter: Payer: Self-pay | Admitting: Allergy

## 2019-08-26 ENCOUNTER — Encounter: Payer: BC Managed Care – PPO | Admitting: Allergy

## 2019-08-26 ENCOUNTER — Ambulatory Visit: Payer: BC Managed Care – PPO | Admitting: Allergy

## 2019-08-26 DIAGNOSIS — L239 Allergic contact dermatitis, unspecified cause: Secondary | ICD-10-CM

## 2019-08-26 NOTE — Assessment & Plan Note (Signed)
48 hour TRUE test reading:  Borderline positive to Carba Mix and gold.

## 2019-08-27 DIAGNOSIS — H6982 Other specified disorders of Eustachian tube, left ear: Secondary | ICD-10-CM | POA: Diagnosis not present

## 2019-08-28 ENCOUNTER — Encounter: Payer: Self-pay | Admitting: Family

## 2019-08-28 ENCOUNTER — Other Ambulatory Visit: Payer: Self-pay

## 2019-08-28 ENCOUNTER — Ambulatory Visit: Payer: BC Managed Care – PPO | Admitting: Family

## 2019-08-28 VITALS — Temp 98.0°F

## 2019-08-28 DIAGNOSIS — L239 Allergic contact dermatitis, unspecified cause: Secondary | ICD-10-CM

## 2019-08-28 NOTE — Progress Notes (Signed)
Ceria returns to the office today for the final patch test interpretation, given suspected history of contact dermatitis.    Diagnostics:   T.R.U.E. Test - 08/28/19 1428    Time Antigen Placed 1100    Manufacturer [employer]    Lot # [id number]    Location Back    Number of Test 36    Reading Interval Day 1;Day 3;Day 5    Panel Panel 1;Panel 2;Panel 3    1. Nickel Sulfate 0    2. Wool Alcohols 0    3. Neomycin Sulfate 0    4. Potassium Dichromate 0    5. Caine Mix 0    6. Fragrance Mix 0    7. Colophony 0    8. Paraben Mix 0    9. Negative Control 0    10. Balsam of [place] 0    11. Ethylenediamine Dihydrochloride 0    12. Cobalt Dichloride 0    13. p-tert Butylphenol Formaldehyde Resin 0    14. Epoxy Resin 0    15. Carba Mix 0    16.  Black Rubber Mix 0    17. Cl+ Me-Isothiazolinone 0    18. Quaternium-15 0    19. Methyldibromo Glutaronitrile 0    20. p-Phenylenediamine 0    21. Formaldehyde 0    22. Mercapto Mix 0    23. Thimerosal 0    24. Thiuram Mix 0    25. Diazolidinyl Urea 0    26. Quinoline Mix 0    27. Tixocortol-21-Pivalate 0    28. Gold Sodium Thiosulfate 0    29. Imidazolidinyl Urea 0    30. Budesonide 0    31. Hydrocortisone-17-Butyrate 0    32. Mercaptobenzothiazole 0    33. Bacitracin 0    34. Parthenolide 0    35. Disperse Blue 106 0    36. 2-Bromo-2-Nitropropane-1,3-diol 0            TRUE TEST 96-hour hour reading: negative reaction to #1 (Nickel Sulfate), negative reaction to #2 (Wool Alcohols), negative reaction to #3 (Neomycin sulfate), negative reaction to #4 (Potassium dichromate), negative reaction to #5 [employer] mix), negative reaction to  #6 (Fragrance mix), negative reaction to #7 (Colophony), negative reaction to #9 (Paraben mix), negative reaction to #10 (Balsam of [place]), negative reaction to #11 (Ethylenediamine dihydrochloride), negative reaction to #12 (Cobalt chloride), negative reaction to #13 (p-tert Butylphenol formaldehyde resin),  negative reaction to #14 (Epoxy resin), negative reaction to #15 (Carba mix), negative reaction to #16 (Black rubber mix), negative reaction to #17 (Cl+Me-Isothiazolinone), negative reaction to #18 (Quaternium-15), negative reaction to #19 (Methyldibromo Glutaronitrile), negative reaction to #20 (p-Phenylene-diamine), negative reaction to #21 (Formaldehyde), negative reaction to #22 (Mercapto mix), negative reaction to #23 (Thiomersal), negative reaction to #24 (Thiuram mix), negative reaction to #25 (Diazolidinyl urea), negative reaction to #26 (Quinoline mix), negative reaction to #27 (Tixocortol-21-pivalate), negative reaction to #28 (Gold sodium thiosulfate), negative reaction to #29 (Imidazolidinyl urea), negative reaction to #30 (Budesonide), negative reaction to #31 (Hydrocortisone-17-butyrate), negative reaction to #32 (Mercapto-benzothiazole), negative reaction to #33 (Bacitracin), negative reaction to #34 (Parthenolide), negative reaction to #35 (Disperse blue) and negative reaction to #36 (Bronopol)  Plan:   Allergic contact dermatitis - The patient has been provided detailed information regarding the substances she is sensitive to, as well as products containing the substances.   - Meticulous avoidance of these substances is recommended.  - If avoidance is not possible, the use of barrier creams or lotions is recommended. -  If symptoms persist or progress despite meticulous avoidance of Carba Mix and Gold Sodium Thiosulfate, Dermatology Referral may be warranted.  Thank you,  Althea Charon, FNP [facility] and [place] [facility]

## 2019-09-02 DIAGNOSIS — M6248 Contracture of muscle, other site: Secondary | ICD-10-CM | POA: Diagnosis not present

## 2019-09-02 DIAGNOSIS — M542 Cervicalgia: Secondary | ICD-10-CM | POA: Diagnosis not present

## 2019-09-02 DIAGNOSIS — R102 Pelvic and perineal pain: Secondary | ICD-10-CM | POA: Diagnosis not present

## 2019-09-07 DIAGNOSIS — M542 Cervicalgia: Secondary | ICD-10-CM | POA: Diagnosis not present

## 2019-09-07 DIAGNOSIS — R102 Pelvic and perineal pain: Secondary | ICD-10-CM | POA: Diagnosis not present

## 2019-09-07 DIAGNOSIS — M6248 Contracture of muscle, other site: Secondary | ICD-10-CM | POA: Diagnosis not present

## 2019-09-18 ENCOUNTER — Encounter: Payer: BC Managed Care – PPO | Admitting: Family Medicine

## 2019-10-19 DIAGNOSIS — M6248 Contracture of muscle, other site: Secondary | ICD-10-CM | POA: Diagnosis not present

## 2019-10-19 DIAGNOSIS — M542 Cervicalgia: Secondary | ICD-10-CM | POA: Diagnosis not present

## 2019-10-19 DIAGNOSIS — R102 Pelvic and perineal pain: Secondary | ICD-10-CM | POA: Diagnosis not present

## 2019-10-26 ENCOUNTER — Telehealth: Payer: Self-pay

## 2019-10-26 NOTE — Telephone Encounter (Signed)
Pt called requesting to transfer care from Dr. Jonni Sanger to Dr. Rogers Blocker. Pt has a friend who sees Dr. Rogers Blocker and she is interested in transferring. [place] for approval? Please advise.

## 2019-10-27 NOTE — Telephone Encounter (Signed)
Sure thing!

## 2019-10-28 DIAGNOSIS — N809 Endometriosis, unspecified: Secondary | ICD-10-CM | POA: Diagnosis not present

## 2019-10-28 NOTE — Telephone Encounter (Signed)
Sure,  D.r Rogers Blocker

## 2019-10-28 NOTE — Telephone Encounter (Signed)
PT scheduled with Dr. Rogers Blocker on 10/29

## 2019-10-30 DIAGNOSIS — R102 Pelvic and perineal pain: Secondary | ICD-10-CM | POA: Diagnosis not present

## 2019-10-30 DIAGNOSIS — N809 Endometriosis, unspecified: Secondary | ICD-10-CM | POA: Diagnosis not present

## 2019-10-30 DIAGNOSIS — G8929 Other chronic pain: Secondary | ICD-10-CM | POA: Diagnosis not present

## 2019-11-16 DIAGNOSIS — D225 Melanocytic nevi of trunk: Secondary | ICD-10-CM | POA: Diagnosis not present

## 2019-11-16 DIAGNOSIS — D2262 Melanocytic nevi of left upper limb, including shoulder: Secondary | ICD-10-CM | POA: Diagnosis not present

## 2019-11-16 DIAGNOSIS — D2261 Melanocytic nevi of right upper limb, including shoulder: Secondary | ICD-10-CM | POA: Diagnosis not present

## 2019-11-16 DIAGNOSIS — L858 Other specified epidermal thickening: Secondary | ICD-10-CM | POA: Diagnosis not present

## 2019-11-17 ENCOUNTER — Encounter: Payer: BC Managed Care – PPO | Admitting: Family Medicine

## 2019-11-20 ENCOUNTER — Other Ambulatory Visit: Payer: Self-pay

## 2019-11-20 ENCOUNTER — Encounter: Payer: Self-pay | Admitting: Family Medicine

## 2019-11-20 ENCOUNTER — Ambulatory Visit (INDEPENDENT_AMBULATORY_CARE_PROVIDER_SITE_OTHER): Payer: BC Managed Care – PPO | Admitting: Family Medicine

## 2019-11-20 VITALS — BP 118/83 | HR 79 | Temp 98.0°F | Ht 66.0 in | Wt 204.2 lb

## 2019-11-20 DIAGNOSIS — Z0000 Encounter for general adult medical examination without abnormal findings: Secondary | ICD-10-CM

## 2019-11-20 DIAGNOSIS — E038 Other specified hypothyroidism: Secondary | ICD-10-CM | POA: Diagnosis not present

## 2019-11-20 DIAGNOSIS — Z23 Encounter for immunization: Secondary | ICD-10-CM | POA: Diagnosis not present

## 2019-11-20 DIAGNOSIS — Z8041 Family history of malignant neoplasm of ovary: Secondary | ICD-10-CM | POA: Insufficient documentation

## 2019-11-20 NOTE — Progress Notes (Signed)
Patient: Erika Garrett MRN: [id number] DOB: 1980/11/11 PCP: Orma Flaming, MD     Subjective:  Chief Complaint  Patient presents with  . Annual Exam    HPI: The patient is a 39 y.o. female who presents today for annual exam. She denies any changes to past medical history. There have been no recent hospitalizations. They are following a well balanced diet and exercise plan. Pt is on low fiber diet until upcoming Hysterectomy. She walks daily. Weight has been stable. Pt will be having a Hysterectomy on 11/27/2019. She would like to discuss getting the flu shot before surgery.   No colon or breast cancer in first degree, but her mother had ovarian cancer. She has been BRCA tested and it's negative.   Subclinical hypothyroidism Was put on a very low dose in her thirties when trying to get pregnant. She never felt off. She stopped taking this about 2-3 weeks ago.    Has a hysterectomy planned next week at [facility] due to endometriosis.   Flu shot today   Immunization History  Administered Date(s) Administered  . Influenza,inj,Quad PF,6+ Mos 12/07/2017, 09/17/2018, 11/20/2019  . PFIZER SARS-COV-2 Vaccination 04/30/2019, 05/07/2019  . Tdap 07/11/2017   Colonoscopy: 02/2019. Normal. Repeat 10 years.  Mammogram: this year  Pap smear: 04/2017. Sees gynecology   Had had her covid vaccines.   Review of Systems  Constitutional: Negative for appetite change, chills, fatigue and fever.  HENT: Negative for dental problem, ear pain, hearing loss and trouble swallowing.   Eyes: Negative for visual disturbance.  Respiratory: Negative for cough, chest tightness and shortness of breath.   Cardiovascular: Negative for chest pain, palpitations and leg swelling.  Gastrointestinal: Negative for abdominal pain, blood in stool, diarrhea and nausea.  Endocrine: Negative for cold intolerance, polydipsia, polyphagia and polyuria.  Genitourinary: Negative for dysuria, frequency, hematuria and urgency.   Musculoskeletal: Negative for arthralgias.  Skin: Negative for rash.  Neurological: Negative for dizziness and headaches.  Psychiatric/Behavioral: Negative for dysphoric mood and sleep disturbance. The patient is not nervous/anxious.     Allergies Patient is allergic to estrogens, zithromax [azithromycin], and levonorgestrel-ethinyl estrad.  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  Surgical History Patient  has a past surgical history that includes Robotic assisted laparoscopic partial cystectomy and Ablation on endometriosis.  Family History Pateint's family history includes Alcohol abuse in her father; Alzheimer's disease in her maternal grandmother; Cancer in her maternal grandmother; Colon polyps in her mother; Endocrine tumor in her father; Healthy in her sister; Hemachromatosis in her paternal grandmother; Ovarian cancer in her mother; Pancreatic cancer in her father.  Social History Patient  reports that she has quit smoking. Her smoking use included cigarettes. She has never used smokeless tobacco. She reports current alcohol use. She reports that she does not use drugs.    Objective: Vitals:   11/20/19 1058  BP: 118/83  Pulse: 79  Temp: 98 F (36.7 C)  TempSrc: Temporal  SpO2: 95%  Weight: 204 lb 3.2 oz (92.6 kg)  Height: 5' 6"  (1.676 m)    Body mass index is 32.96 kg/m.  Physical Exam Vitals reviewed.  Constitutional:      Appearance: Normal appearance. She is well-developed. She is obese.  HENT:     Head: Normocephalic and atraumatic.     Right Ear: Tympanic membrane, ear canal and external ear  normal.     Left Ear: Tympanic membrane, ear canal and external ear normal.     Ears:     Comments: Scarring on bilateral  TM    Nose: Nose normal.     Mouth/Throat:     Mouth: Mucous membranes are moist.  Eyes:     Extraocular Movements: Extraocular movements intact.     Conjunctiva/sclera: Conjunctivae normal.     Pupils: Pupils are equal, round, and reactive to light.  Neck:     Thyroid: No thyromegaly.     Vascular: No carotid bruit.  Cardiovascular:     Rate and Rhythm: Normal rate and regular rhythm.     Pulses: Normal pulses.     Heart sounds: Normal heart sounds. No murmur heard.   Pulmonary:     Effort: Pulmonary effort is normal.     Breath sounds: Normal breath sounds.  Abdominal:     General: Bowel sounds are normal. There is no distension.     Palpations: Abdomen is soft.     Tenderness: There is no abdominal tenderness.  Musculoskeletal:     Cervical back: Normal range of motion and neck supple.  Lymphadenopathy:     Cervical: No cervical adenopathy.  Skin:    General: Skin is warm and dry.     Capillary Refill: Capillary refill takes less than 2 seconds.     Findings: No rash.  Neurological:     General: No focal deficit present.     Mental Status: She is alert and oriented to person, place, and time.     Cranial Nerves: No cranial nerve deficit.     Coordination: Coordination normal.     Deep Tendon Reflexes: Reflexes normal.  Psychiatric:        Mood and Affect: Mood normal.        Behavior: Behavior normal.          Office Visit from 11/20/2019 in [place]  PHQ-2 Total Score 0      Assessment/plan: 1. Annual physical exam  HM reviewed. UTD on this, just needs mmg and she will get this done with gyn. Encouraged more exercise. Has a lot coming up with surgery. Will f/u in one year or as needed.  Patient counseling [x]    Nutrition: Stressed importance of moderation in sodium/caffeine intake, saturated fat and cholesterol, caloric balance, sufficient intake of fresh fruits, vegetables, fiber, calcium, iron, and 1 mg of folate supplement per day  (for females capable of pregnancy).  [x]    Stressed the importance of regular exercise.   []    Substance Abuse: Discussed cessation/primary prevention of tobacco, alcohol, or other drug use; driving or other dangerous activities under the influence; availability of treatment for abuse.   [x]    Injury prevention: Discussed safety belts, safety helmets, smoke detector, smoking near bedding or upholstery.   [x]    Sexuality: Discussed sexually transmitted diseases, partner selection, use of condoms, avoidance of unintended pregnancy  and contraceptive alternatives.  [x]    Dental health: Discussed importance of regular tooth brushing, flossing, and dental visits.  [x]    Health maintenance and immunizations reviewed. Please refer to Health maintenance section.    - CBC with Differential/Platelet; Future - Lipid panel; Future - COMPLETE METABOLIC PANEL WITH GFR; Future  2. Subclinical hypothyroidism Off medication will follow this up. Discussed if normal will need to repeat labs in 6-8 weeks and watch closely clinically and with labs.  - TSH; Future - T4, free; Future  3. Flu  shot today    This visit occurred during the SARS-CoV-2 public health emergency.  Safety protocols were in place, including screening questions prior to the visit, additional usage of staff PPE, and extensive cleaning of exam room while observing appropriate contact time as indicated for disinfecting solutions.     Return in about 1 year (around 11/19/2020) for annual, routine labs for thyroid though .     Orma Flaming, MD [place]  11/20/2019

## 2019-11-20 NOTE — Patient Instructions (Signed)
-get mammogram once you are all healed! -flu shot today   -so nice to meet you! Let's watch your thyroid. Repeat labs in 8 weeks.   So nice to meet you! Dr. Rogers Blocker   Preventive Care 72-40 Years Old, Female Preventive care refers to visits with your health care provider and lifestyle choices that can promote health and wellness. This includes:  A yearly physical exam. This may also be called an annual well check.  Regular dental visits and eye exams.  Immunizations.  Screening for certain conditions.  Healthy lifestyle choices, such as eating a healthy diet, getting regular exercise, not using drugs or products that contain nicotine and tobacco, and limiting alcohol use. What can I expect for my preventive care visit? Physical exam Your health care provider will check your:  Height and weight. This may be used to calculate body mass index (BMI), which tells if you are at a healthy weight.  Heart rate and blood pressure.  Skin for abnormal spots. Counseling Your health care provider may ask you questions about your:  Alcohol, tobacco, and drug use.  Emotional well-being.  Home and relationship well-being.  Sexual activity.  Eating habits.  Work and work Statistician.  Method of birth control.  Menstrual cycle.  Pregnancy history. What immunizations do I need?  Influenza (flu) vaccine  This is recommended every year. Tetanus, diphtheria, and pertussis (Tdap) vaccine  You may need a Td booster every 10 years. Varicella (chickenpox) vaccine  You may need this if you have not been vaccinated. Zoster (shingles) vaccine  You may need this after age 23. Measles, mumps, and rubella (MMR) vaccine  You may need at least one dose of MMR if you were born in 1957 or later. You may also need a second dose. Pneumococcal conjugate (PCV13) vaccine  You may need this if you have certain conditions and were not previously vaccinated. Pneumococcal polysaccharide (PPSV23)  vaccine  You may need one or two doses if you smoke cigarettes or if you have certain conditions. Meningococcal conjugate (MenACWY) vaccine  You may need this if you have certain conditions. Hepatitis A vaccine  You may need this if you have certain conditions or if you travel or work in places where you may be exposed to hepatitis A. Hepatitis B vaccine  You may need this if you have certain conditions or if you travel or work in places where you may be exposed to hepatitis B. Haemophilus influenzae type b (Hib) vaccine  You may need this if you have certain conditions. Human papillomavirus (HPV) vaccine  If recommended by your health care provider, you may need three doses over 6 months. You may receive vaccines as individual doses or as more than one vaccine together in one shot (combination vaccines). Talk with your health care provider about the risks and benefits of combination vaccines. What tests do I need? Blood tests  Lipid and cholesterol levels. These may be checked every 5 years, or more frequently if you are over 6 years old.  Hepatitis C test.  Hepatitis B test. Screening  Lung cancer screening. You may have this screening every year starting at age 40 if you have a 30-pack-year history of smoking and currently smoke or have quit within the past 15 years.  Colorectal cancer screening. All adults should have this screening starting at age 40 and continuing until age 69. Your health care provider may recommend screening at age 56 if you are at increased risk. You will have tests every  1-10 years, depending on your results and the type of screening test.  Diabetes screening. This is done by checking your blood sugar (glucose) after you have not eaten for a while (fasting). You may have this done every 1-3 years.  Mammogram. This may be done every 1-2 years. Talk with your health care provider about when you should start having regular mammograms. This may depend on  whether you have a family history of breast cancer.  BRCA-related cancer screening. This may be done if you have a family history of breast, ovarian, tubal, or peritoneal cancers.  Pelvic exam and Pap test. This may be done every 3 years starting at age 40. Starting at age 40, this may be done every 5 years if you have a Pap test in combination with an HPV test. Other tests  Sexually transmitted disease (STD) testing.  Bone density scan. This is done to screen for osteoporosis. You may have this scan if you are at high risk for osteoporosis. Follow these instructions at home: Eating and drinking  Eat a diet that includes fresh fruits and vegetables, whole grains, lean protein, and low-fat dairy.  Take vitamin and mineral supplements as recommended by your health care provider.  Do not drink alcohol if: ? Your health care provider tells you not to drink. ? You are pregnant, may be pregnant, or are planning to become pregnant.  If you drink alcohol: ? Limit how much you have to 0-1 drink a day. ? Be aware of how much alcohol is in your drink. In the [place] one drink equals one 12 oz bottle of beer (355 mL), one 5 oz glass of wine (148 mL), or one 1 oz glass of hard liquor (44 mL). Lifestyle  Take daily care of your teeth and gums.  Stay active. Exercise for at least 30 minutes on 5 or more days each week.  Do not use any products that contain nicotine or tobacco, such as cigarettes, e-cigarettes, and chewing tobacco. If you need help quitting, ask your health care provider.  If you are sexually active, practice safe sex. Use a condom or other form of birth control (contraception) in order to prevent pregnancy and STIs (sexually transmitted infections).  If told by your health care provider, take low-dose aspirin daily starting at age 25. What's next?  Visit your health care provider once a year for a well check visit.  Ask your health care provider how often you should have your  eyes and teeth checked.  Stay up to date on all vaccines. This information is not intended to replace advice given to you by your health care provider. Make sure you discuss any questions you have with your health care provider. Document Revised: 09/19/2017 Document Reviewed: 09/19/2017 Elsevier Patient Education  2020 [employer].

## 2019-11-21 LAB — LIPID PANEL
Cholesterol: 216 mg/dL — ABNORMAL HIGH (ref <200)
HDL: 43 mg/dL — ABNORMAL LOW (ref >=50)
LDL Cholesterol (Calc): 138 mg/dL (calc) — ABNORMAL HIGH
Non-HDL Cholesterol (Calc): 173 mg/dL (calc) — ABNORMAL HIGH (ref <130)
Total CHOL/HDL Ratio: 5 (calc) — ABNORMAL HIGH (ref <5.0)
Triglycerides: 212 mg/dL — ABNORMAL HIGH (ref <150)

## 2019-11-21 LAB — CBC WITH DIFFERENTIAL/PLATELET
Absolute Monocytes: 616 cells/uL (ref 200–950)
Basophils Absolute: 55 cells/uL (ref 0–200)
Basophils Relative: 0.5 %
Eosinophils Absolute: 176 cells/uL (ref 15–500)
Eosinophils Relative: 1.6 %
HCT: 43.1 % (ref 35.0–45.0)
Hemoglobin: 14.7 g/dL (ref 11.7–15.5)
Lymphs Abs: 2937 cells/uL (ref 850–3900)
MCH: 31.7 pg (ref 27.0–33.0)
MCHC: 34.1 g/dL (ref 32.0–36.0)
MCV: 93.1 fL (ref 80.0–100.0)
MPV: 10.6 fL (ref 7.5–12.5)
Monocytes Relative: 5.6 %
Neutro Abs: 7216 cells/uL (ref 1500–7800)
Neutrophils Relative %: 65.6 %
Platelets: 246 10*3/uL (ref 140–400)
RBC: 4.63 10*6/uL (ref 3.80–5.10)
RDW: 11.9 % (ref 11.0–15.0)
Total Lymphocyte: 26.7 %
WBC: 11 10*3/uL — ABNORMAL HIGH (ref 3.8–10.8)

## 2019-11-21 LAB — COMPLETE METABOLIC PANEL WITH GFR
AG Ratio: 2.3 (calc) (ref 1.0–2.5)
ALT: 45 U/L — ABNORMAL HIGH (ref 6–29)
AST: 30 U/L (ref 10–30)
Albumin: 4.4 g/dL (ref 3.6–5.1)
Alkaline phosphatase (APISO): 84 U/L (ref 31–125)
BUN: 16 mg/dL (ref 7–25)
CO2: 26 mmol/L (ref 20–32)
Calcium: 9.7 mg/dL (ref 8.6–10.2)
Chloride: 106 mmol/L (ref 98–110)
Creat: 0.64 mg/dL (ref 0.50–1.10)
GFR, Est African American: 129 mL/min/{1.73_m2} (ref >=60)
GFR, Est Non African American: 112 mL/min/{1.73_m2} (ref >=60)
Globulin: 1.9 g/dL (calc) (ref 1.9–3.7)
Glucose, Bld: 85 mg/dL (ref 65–99)
Potassium: 4.2 mmol/L (ref 3.5–5.3)
Sodium: 139 mmol/L (ref 135–146)
Total Bilirubin: 0.4 mg/dL (ref 0.2–1.2)
Total Protein: 6.3 g/dL (ref 6.1–8.1)

## 2019-11-21 LAB — T4, FREE: Free T4: 1.2 ng/dL (ref 0.8–1.8)

## 2019-11-21 LAB — TSH: TSH: 2.12 mIU/L

## 2019-11-23 ENCOUNTER — Other Ambulatory Visit: Payer: Self-pay | Admitting: Family Medicine

## 2019-11-23 DIAGNOSIS — E038 Other specified hypothyroidism: Secondary | ICD-10-CM

## 2019-11-23 DIAGNOSIS — R7401 Elevation of levels of liver transaminase levels: Secondary | ICD-10-CM

## 2019-11-25 DIAGNOSIS — Z7189 Other specified counseling: Secondary | ICD-10-CM | POA: Diagnosis not present

## 2019-11-27 DIAGNOSIS — D219 Benign neoplasm of connective and other soft tissue, unspecified: Secondary | ICD-10-CM | POA: Diagnosis not present

## 2019-11-27 DIAGNOSIS — N838 Other noninflammatory disorders of ovary, fallopian tube and broad ligament: Secondary | ICD-10-CM | POA: Diagnosis not present

## 2019-11-27 DIAGNOSIS — Z87891 Personal history of nicotine dependence: Secondary | ICD-10-CM | POA: Diagnosis not present

## 2019-11-27 DIAGNOSIS — N72 Inflammatory disease of cervix uteri: Secondary | ICD-10-CM | POA: Diagnosis not present

## 2019-11-27 DIAGNOSIS — K219 Gastro-esophageal reflux disease without esophagitis: Secondary | ICD-10-CM | POA: Diagnosis not present

## 2019-11-27 DIAGNOSIS — Z6833 Body mass index (BMI) 33.0-33.9, adult: Secondary | ICD-10-CM | POA: Diagnosis not present

## 2019-11-27 DIAGNOSIS — Z79899 Other long term (current) drug therapy: Secondary | ICD-10-CM | POA: Diagnosis not present

## 2019-11-27 DIAGNOSIS — D259 Leiomyoma of uterus, unspecified: Secondary | ICD-10-CM | POA: Diagnosis not present

## 2019-11-27 DIAGNOSIS — K660 Peritoneal adhesions (postprocedural) (postinfection): Secondary | ICD-10-CM | POA: Diagnosis not present

## 2019-11-27 DIAGNOSIS — D251 Intramural leiomyoma of uterus: Secondary | ICD-10-CM | POA: Diagnosis not present

## 2019-11-27 DIAGNOSIS — N802 Endometriosis of fallopian tube: Secondary | ICD-10-CM | POA: Diagnosis not present

## 2019-11-27 DIAGNOSIS — I10 Essential (primary) hypertension: Secondary | ICD-10-CM | POA: Diagnosis not present

## 2019-11-27 DIAGNOSIS — N809 Endometriosis, unspecified: Secondary | ICD-10-CM | POA: Diagnosis not present

## 2019-11-27 DIAGNOSIS — N841 Polyp of cervix uteri: Secondary | ICD-10-CM | POA: Diagnosis not present

## 2019-11-27 DIAGNOSIS — R102 Pelvic and perineal pain: Secondary | ICD-10-CM | POA: Diagnosis not present

## 2019-11-27 DIAGNOSIS — N800 Endometriosis of uterus: Secondary | ICD-10-CM | POA: Diagnosis not present

## 2019-11-27 DIAGNOSIS — N803 Endometriosis of pelvic peritoneum: Secondary | ICD-10-CM | POA: Diagnosis not present

## 2019-11-27 DIAGNOSIS — E669 Obesity, unspecified: Secondary | ICD-10-CM | POA: Diagnosis not present

## 2019-11-27 DIAGNOSIS — G8929 Other chronic pain: Secondary | ICD-10-CM | POA: Diagnosis not present

## 2019-11-27 DIAGNOSIS — N805 Endometriosis of intestine: Secondary | ICD-10-CM | POA: Diagnosis not present

## 2020-01-25 ENCOUNTER — Ambulatory Visit: Payer: BC Managed Care – PPO | Admitting: Family Medicine

## 2020-01-27 ENCOUNTER — Ambulatory Visit: Payer: BC Managed Care – PPO | Admitting: Family Medicine

## 2020-02-01 ENCOUNTER — Other Ambulatory Visit: Payer: Self-pay

## 2020-02-01 ENCOUNTER — Encounter: Payer: Self-pay | Admitting: Family Medicine

## 2020-02-01 ENCOUNTER — Ambulatory Visit (INDEPENDENT_AMBULATORY_CARE_PROVIDER_SITE_OTHER): Payer: BC Managed Care – PPO | Admitting: Family Medicine

## 2020-02-01 VITALS — BP 124/62 | HR 77 | Temp 98.1°F | Resp 18 | Ht 66.0 in | Wt 206.4 lb

## 2020-02-01 DIAGNOSIS — E038 Other specified hypothyroidism: Secondary | ICD-10-CM | POA: Diagnosis not present

## 2020-02-01 DIAGNOSIS — N8189 Other female genital prolapse: Secondary | ICD-10-CM

## 2020-02-01 DIAGNOSIS — R5383 Other fatigue: Secondary | ICD-10-CM | POA: Diagnosis not present

## 2020-02-01 DIAGNOSIS — L659 Nonscarring hair loss, unspecified: Secondary | ICD-10-CM

## 2020-02-01 DIAGNOSIS — E559 Vitamin D deficiency, unspecified: Secondary | ICD-10-CM

## 2020-02-01 DIAGNOSIS — R7401 Elevation of levels of liver transaminase levels: Secondary | ICD-10-CM

## 2020-02-01 LAB — CBC WITH DIFFERENTIAL/PLATELET
Basophils Absolute: 0.1 10*3/uL (ref 0.0–0.1)
Basophils Relative: 0.6 % (ref 0.0–3.0)
Eosinophils Absolute: 0.2 10*3/uL (ref 0.0–0.7)
Eosinophils Relative: 1.6 % (ref 0.0–5.0)
HCT: 43 % (ref 36.0–46.0)
Hemoglobin: 14.7 g/dL (ref 12.0–15.0)
Lymphocytes Relative: 29.7 % (ref 12.0–46.0)
Lymphs Abs: 2.9 10*3/uL (ref 0.7–4.0)
MCHC: 34.1 g/dL (ref 30.0–36.0)
MCV: 90.3 fl (ref 78.0–100.0)
Monocytes Absolute: 0.5 10*3/uL (ref 0.1–1.0)
Monocytes Relative: 5.1 % (ref 3.0–12.0)
Neutro Abs: 6.1 10*3/uL (ref 1.4–7.7)
Neutrophils Relative %: 63 % (ref 43.0–77.0)
Platelets: 258 10*3/uL (ref 150.0–400.0)
RBC: 4.76 Mil/uL (ref 3.87–5.11)
RDW: 12.6 % (ref 11.5–15.5)
WBC: 9.7 10*3/uL (ref 4.0–10.5)

## 2020-02-01 LAB — COMPREHENSIVE METABOLIC PANEL
ALT: 70 U/L — ABNORMAL HIGH (ref 0–35)
AST: 36 U/L (ref 0–37)
Albumin: 4.8 g/dL (ref 3.5–5.2)
Alkaline Phosphatase: 86 U/L (ref 39–117)
BUN: 15 mg/dL (ref 6–23)
CO2: 27 mEq/L (ref 19–32)
Calcium: 9.9 mg/dL (ref 8.4–10.5)
Chloride: 102 mEq/L (ref 96–112)
Creatinine, Ser: 0.69 mg/dL (ref 0.40–1.20)
GFR: 108.64 mL/min (ref >60.00)
Glucose, Bld: 95 mg/dL (ref 70–99)
Potassium: 3.8 mEq/L (ref 3.5–5.1)
Sodium: 137 mEq/L (ref 135–145)
Total Bilirubin: 0.5 mg/dL (ref 0.2–1.2)
Total Protein: 7.3 g/dL (ref 6.0–8.3)

## 2020-02-01 LAB — T4, FREE: Free T4: 0.72 ng/dL (ref 0.60–1.60)

## 2020-02-01 LAB — TSH: TSH: 2.38 u[IU]/mL (ref 0.35–4.50)

## 2020-02-01 LAB — VITAMIN D 25 HYDROXY (VIT D DEFICIENCY, FRACTURES): VITD: 33.06 ng/mL (ref 30.00–100.00)

## 2020-02-01 LAB — VITAMIN B12: Vitamin B-12: 358 pg/mL (ref 211–911)

## 2020-02-01 NOTE — Patient Instructions (Addendum)
-  referral done for weight loss clinic.   -referral done for pelvic floor rehab as well.   -will do labs today.   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 Can take 9-12 months to grow back.   So good to see you! Dr. Rogers Blocker

## 2020-02-01 NOTE — Progress Notes (Signed)
Patient: Erika Garrett MRN: [id number] DOB: 06-11-1980 PCP: Orma Flaming, MD     Subjective:  Chief Complaint  Patient presents with  . Surgery Follow Up    Patients states that she has been well since having surgery. She would like to discuss going back to "Pelvic Health" somewhere that's closer to home. She has a follow up with [facility] once a year now.   . Weight Gain    She would like to discuss ways for her to lose weight. She stated that she walks 3-4 miles a day and she watches what she eats but it seems that the weight wont come off.   . Alopecia  . lab work    HPI: The patient is a 40 y.o. female who presents today for follow up of her surgery for endometriosis. She had a complete hysterectomy, fallopian tubes and appendix removed. They did leave her ovaries. They also were able to remove from her bowel.  She is doing well overall. She is requesting pelvic floor therapy.   Hx of vitamin D deficiency Would like rechecked today.   Hx of subclinical hypothyroidism I stopped her thyroid medication at her last visit in 10/21. She is due for recheck of her thyroid today.   Elevated ALT -due for recheck today.   Weight gain She is frustrated with her weight gain. She has gained 2 pounds over the past few months. 3 years ago she was 150 pounds. She has tried to eat clean and just was released to start physical activity back.   Hair loss Has noticed hair loss on the sides of her head. More thinning. No bald patches. No hair loss in family.   Review of Systems  Constitutional: Negative for chills and fever.  HENT: Negative for congestion and sore throat.   Respiratory: Negative for cough, choking, shortness of breath and wheezing.   Cardiovascular: Negative for chest pain and palpitations.  Gastrointestinal: Negative for abdominal pain, diarrhea and nausea.  Skin:       Hair loss   Neurological: Negative for dizziness, seizures, light-headedness and headaches.   Psychiatric/Behavioral: Negative for agitation, confusion, hallucinations, sleep disturbance and suicidal ideas.    Allergies Patient is allergic to estrogens, zithromax [azithromycin], and levonorgestrel-ethinyl estrad.  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  Surgical History Patient  has a past surgical history that includes Robotic assisted laparoscopic partial cystectomy and Ablation on endometriosis.  Family History Pateint's family history includes Alcohol abuse in her father; Alzheimer's disease in her maternal grandmother; Cancer in her maternal grandmother; Colon polyps in her mother; Endocrine tumor in her father; Healthy in her sister; Hemachromatosis in her paternal grandmother; Ovarian cancer in her mother; Pancreatic cancer in her father.  Social History Patient  reports that she has quit smoking. Her smoking use included cigarettes. She has never used smokeless tobacco. She reports current alcohol use. She reports that she does not use drugs.    Objective: Vitals:   02/01/20 1044  BP: 124/62  Pulse: 77  Resp: 18  Temp: 98.1 F (36.7 C)  TempSrc: Temporal  SpO2: 98%  Weight: 206 lb 6.4 oz (93.6 kg)  Height: 5\' 6"  (1.676 m)    Body mass index is 33.31 kg/m.  Physical Exam Vitals reviewed.  Constitutional:      Appearance: Normal appearance. She is well-developed and well-nourished.  She is obese.  HENT:     Head: Normocephalic and atraumatic.     Right Ear: External ear normal.     Left Ear: External ear normal.     Mouth/Throat:     Mouth: Oropharynx is clear and moist.  Eyes:     Extraocular Movements: EOM normal.     Conjunctiva/sclera: Conjunctivae normal.     Pupils: Pupils are equal, round, and reactive  to light.  Neck:     Thyroid: No thyromegaly.     Comments: No thyromegaly  Cardiovascular:     Rate and Rhythm: Normal rate and regular rhythm.     Pulses: Intact distal pulses.     Heart sounds: Normal heart sounds. No murmur heard.   Pulmonary:     Effort: Pulmonary effort is normal.     Breath sounds: Normal breath sounds.  Abdominal:     General: Abdomen is flat. Bowel sounds are normal. There is no distension.     Palpations: Abdomen is soft.     Tenderness: There is abdominal tenderness (over one scar from surgery ).  Musculoskeletal:     Cervical back: Normal range of motion and neck supple.  Lymphadenopathy:     Cervical: No cervical adenopathy.  Skin:    General: Skin is warm and dry.     Capillary Refill: Capillary refill takes less than 2 seconds.     Findings: No rash.     Comments: Negative hair pull test   Neurological:     General: No focal deficit present.     Mental Status: She is alert and oriented to person, place, and time.     Cranial Nerves: No cranial nerve deficit.     Coordination: Coordination normal.     Deep Tendon Reflexes: Reflexes normal.  Psychiatric:        Mood and Affect: Mood and affect and mood normal.        Behavior: Behavior normal.        Assessment/plan: 1. Pelvic floor weakness in female Referral for pelvic floor PT done.  - Ambulatory referral to Physical Therapy  3. Elevated ALT measurement - Comprehensive metabolic panel - CBC with Differential/Platelet  4. Subclinical hypothyroidism  - TSH - T4, free  5. Vitamin D deficiency  - VITAMIN D 25 Hydroxy (Vit-D Deficiency, Fractures)  6. Other fatigue  - Vitamin B12  7. Morbid obesity ([place])  - Amb Ref to Medical Weight Management  8. 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 Time will bring back hair. Discussed disease with her   This visit occurred during the SARS-CoV-2 public health emergency.  Safety protocols were in place, including  screening questions prior to the visit, additional usage of staff PPE, and extensive cleaning of exam room while observing appropriate contact time as indicated for disinfecting solutions.     Return if symptoms worsen or fail to improve.   Orma Flaming, MD [place]   02/01/2020

## 2020-02-02 ENCOUNTER — Encounter: Payer: Self-pay | Admitting: Family Medicine

## 2020-03-01 ENCOUNTER — Encounter (INDEPENDENT_AMBULATORY_CARE_PROVIDER_SITE_OTHER): Payer: Self-pay

## 2020-03-15 DIAGNOSIS — N6310 Unspecified lump in the right breast, unspecified quadrant: Secondary | ICD-10-CM | POA: Diagnosis not present

## 2020-03-15 DIAGNOSIS — N6320 Unspecified lump in the left breast, unspecified quadrant: Secondary | ICD-10-CM | POA: Diagnosis not present

## 2020-03-15 DIAGNOSIS — Z1231 Encounter for screening mammogram for malignant neoplasm of breast: Secondary | ICD-10-CM | POA: Diagnosis not present

## 2020-03-16 ENCOUNTER — Telehealth: Payer: Self-pay

## 2020-03-16 DIAGNOSIS — R928 Other abnormal and inconclusive findings on diagnostic imaging of breast: Secondary | ICD-10-CM

## 2020-03-16 LAB — HM MAMMOGRAPHY

## 2020-03-16 NOTE — Telephone Encounter (Signed)
Re-faxed with signature.

## 2020-03-16 NOTE — Telephone Encounter (Signed)
Done.  Orma Flaming, MD [place]

## 2020-03-16 NOTE — Telephone Encounter (Signed)
[facility] called and stated they need the signature of the Provider on the order.

## 2020-03-16 NOTE — Telephone Encounter (Signed)
[facility] is requesting the following  Reason: Abnormal mammogram  Requesting : Right diagnostic mammogram with ultrasound if needed  Fax: [phone]

## 2020-03-17 ENCOUNTER — Other Ambulatory Visit: Payer: Self-pay

## 2020-03-17 ENCOUNTER — Encounter (INDEPENDENT_AMBULATORY_CARE_PROVIDER_SITE_OTHER): Payer: Self-pay | Admitting: Family Medicine

## 2020-03-17 ENCOUNTER — Ambulatory Visit (INDEPENDENT_AMBULATORY_CARE_PROVIDER_SITE_OTHER): Payer: BC Managed Care – PPO | Admitting: Family Medicine

## 2020-03-17 VITALS — BP 106/71 | HR 106 | Temp 97.8°F | Ht 67.0 in | Wt 200.0 lb

## 2020-03-17 DIAGNOSIS — R7989 Other specified abnormal findings of blood chemistry: Secondary | ICD-10-CM | POA: Diagnosis not present

## 2020-03-17 DIAGNOSIS — R5383 Other fatigue: Secondary | ICD-10-CM | POA: Diagnosis not present

## 2020-03-17 DIAGNOSIS — R0602 Shortness of breath: Secondary | ICD-10-CM

## 2020-03-17 DIAGNOSIS — E669 Obesity, unspecified: Secondary | ICD-10-CM

## 2020-03-17 DIAGNOSIS — Z0289 Encounter for other administrative examinations: Secondary | ICD-10-CM

## 2020-03-17 DIAGNOSIS — Z9189 Other specified personal risk factors, not elsewhere classified: Secondary | ICD-10-CM

## 2020-03-17 DIAGNOSIS — Z1331 Encounter for screening for depression: Secondary | ICD-10-CM

## 2020-03-17 DIAGNOSIS — Z6831 Body mass index (BMI) 31.0-31.9, adult: Secondary | ICD-10-CM

## 2020-03-18 ENCOUNTER — Encounter: Payer: Self-pay | Admitting: Family Medicine

## 2020-03-18 LAB — COMPREHENSIVE METABOLIC PANEL
ALT: 57 IU/L — ABNORMAL HIGH (ref 0–32)
AST: 42 IU/L — ABNORMAL HIGH (ref 0–40)
Albumin/Globulin Ratio: 2 (ref 1.2–2.2)
Albumin: 4.9 g/dL — ABNORMAL HIGH (ref 3.8–4.8)
Alkaline Phosphatase: 105 IU/L (ref 44–121)
BUN/Creatinine Ratio: 27 — ABNORMAL HIGH (ref 9–23)
BUN: 17 mg/dL (ref 6–24)
Bilirubin Total: 0.4 mg/dL (ref 0.0–1.2)
CO2: 18 mmol/L — ABNORMAL LOW (ref 20–29)
Calcium: 9.4 mg/dL (ref 8.7–10.2)
Chloride: 101 mmol/L (ref 96–106)
Creatinine, Ser: 0.63 mg/dL (ref 0.57–1.00)
GFR calc Af Amer: 130 mL/min/{1.73_m2} (ref >59)
GFR calc non Af Amer: 113 mL/min/{1.73_m2} (ref >59)
Globulin, Total: 2.5 g/dL (ref 1.5–4.5)
Glucose: 81 mg/dL (ref 65–99)
Potassium: 4.3 mmol/L (ref 3.5–5.2)
Sodium: 139 mmol/L (ref 134–144)
Total Protein: 7.4 g/dL (ref 6.0–8.5)

## 2020-03-18 LAB — T3: T3, Total: 134 ng/dL (ref 71–180)

## 2020-03-18 LAB — FOLATE: Folate: >20 ng/mL (ref >3.0)

## 2020-03-18 LAB — LIPID PANEL WITH LDL/HDL RATIO
Cholesterol, Total: 233 mg/dL — ABNORMAL HIGH (ref 100–199)
HDL: 45 mg/dL (ref >39)
LDL Chol Calc (NIH): 144 mg/dL — ABNORMAL HIGH (ref 0–99)
LDL/HDL Ratio: 3.2 ratio (ref 0.0–3.2)
Triglycerides: 242 mg/dL — ABNORMAL HIGH (ref 0–149)
VLDL Cholesterol Cal: 44 mg/dL — ABNORMAL HIGH (ref 5–40)

## 2020-03-18 LAB — HEMOGLOBIN A1C
Est. average glucose Bld gHb Est-mCnc: 103 mg/dL
Hgb A1c MFr Bld: 5.2 % (ref 4.8–5.6)

## 2020-03-18 LAB — T4: T4, Total: 8.3 ug/dL (ref 4.5–12.0)

## 2020-03-18 LAB — INSULIN, RANDOM: INSULIN: 6.4 u[IU]/mL (ref 2.6–24.9)

## 2020-03-21 NOTE — Progress Notes (Signed)
Chief Complaint:   OBESITY Erika Garrett (MR# [id number]) is a 40 y.o. female who presents for evaluation and treatment of obesity and related comorbidities. Current BMI is Body mass index is 31.32 kg/m. [name] has been struggling with her weight for many years and has been unsuccessful in either losing weight, maintaining weight loss, or reaching her healthy weight goal.  [name] was referred to [name] for elevated LFT and weight loss.  [name] is currently in the action stage of change and ready to dedicate time achieving and maintaining a healthier weight. [name] is interested in becoming our patient and working on intensive lifestyle modifications including (but not limited to) diet and exercise for weight loss.  [name]'s habits were reviewed today and are as follows: Her family eats meals together, she thinks her family will eat healthier with her, her desired weight loss is 55 lbs, she started gaining weight in the last 5 years, her heaviest weight ever was 200 pounds, she has significant food cravings issues, she snacks frequently in the evenings, she skips meals frequently, she is frequently drinking liquids with calories, she frequently makes poor food choices, she has problems with excessive hunger, she frequently eats larger portions than normal and she struggles with emotional eating.  Depression Screen [name]'s Food and Mood (modified PHQ-9) score was 16.  Depression screen [facility] 2/9 03/17/2020  Decreased Interest 3  Down, Depressed, Hopeless 1  PHQ - 2 Score 4  Altered sleeping 3  Tired, decreased energy 3  Change in appetite 3  Feeling bad or failure about yourself  1  Trouble concentrating 2  Moving slowly or fidgety/restless 0  Suicidal thoughts 0  PHQ-9 Score 16  Difficult doing work/chores Somewhat difficult   Subjective:   1. Other fatigue [name] admits to daytime somnolence and admits to waking up still tired. Patent has a history of symptoms of daytime fatigue. [name] generally  gets 10 hours of sleep per night, and states that she has generally restful sleep. Snoring is present. Apneic episodes are not present. Epworth Sleepiness Score is 15.  2. Shortness of breath on exertion Yanice notes increasing shortness of breath with exercising and seems to be worsening over time with weight gain. She notes getting out of breath sooner with activity than she used to. This has not gotten worse recently. Laquonda denies shortness of breath at rest or orthopnea.  3. Elevated LFTs [name] denies abdominal pain or jaundice. Her ultrasound and MRI does not show significant fatty liver but ALT is only mildly elevated.  4. At risk for impaired function of liver [name] is at risk for impaired function of liver due to likely diagnosis of fatty liver as evidenced by recent elevated liver enzymes.   Assessment/Plan:   1. Other fatigue [name] does feel that her weight is causing her energy to be lower than it should be. Fatigue may be related to obesity, depression or many other causes. Labs will be ordered, and in the meanwhile, [name] will focus on self care including making healthy food choices, increasing physical activity and focusing on stress reduction.  - EKG 12-Lead - Folate - Hemoglobin A1c - Insulin, random - Lipid Panel With LDL/HDL Ratio - T3 - T4  2. Shortness of breath on exertion [name] does feel that she gets out of breath more easily that she used to when she exercises. [name]'s shortness of breath appears to be obesity related and exercise induced. She has agreed to work on weight loss and gradually  increase exercise to treat her exercise induced shortness of breath. Will continue to monitor closely.  3. Elevated LFTs We discussed the likely diagnosis of non-alcoholic fatty liver disease today and how this condition is obesity related. [name] was educated the importance of weight loss. We will check labs today. [name] agreed start her Category 2 plan, and will continue with her weight  loss efforts with healthier diet and exercise as an essential part of her treatment plan.  - Comprehensive metabolic panel  4. Screening for depression [name] had a positive depression screening. Depression is commonly associated with obesity and often results in emotional eating behaviors. We will monitor this closely and work on CBT to help improve the non-hunger eating patterns. Referral to Psychology may be required if no improvement is seen as she continues in our clinic.  5. At risk for impaired function of liver [name] was given approximately 30 minutes of counseling today regarding prevention of impaired liver function. [name] was educated about her risk of developing NASH or even liver failure and advised that the only proven treatment for NAFLD was weight loss of at least 5-10% of body weight.   6. Class 1 obesity with serious comorbidity and body mass index (BMI) of 31.0 to 31.9 in adult, unspecified obesity type [name] is currently in the action stage of change and her goal is to continue with weight loss efforts. I recommend [name] begin the structured treatment plan as follows:  She has agreed to the Category 2 Plan.  Exercise goals: No exercise has been prescribed for now, while we concentrate on nutritional changes.  Behavioral modification strategies: increasing lean protein intake and meal planning and cooking strategies.  She was informed of the importance of frequent follow-up visits to maximize her success with intensive lifestyle modifications for her multiple health conditions. She was informed we would discuss her lab results at her next visit unless there is a critical issue that needs to be addressed sooner. [name] agreed to keep her next visit at the agreed upon time to discuss these results.  Objective:   Blood pressure 106/71, pulse (!) 106, temperature 97.8 F (36.6 C), height 5\' 7"  (1.702 m), weight 200 lb (90.7 kg), SpO2 100 %. Body mass index is 31.32 kg/m.  EKG:  Normal sinus rhythm, rate 87 BPM.  Indirect Calorimeter completed today shows a VO2 of 294 lbs and a REE of 2050.  Her calculated basal metabolic rate is 3329 thus her basal metabolic rate is better than expected.  General: Cooperative, alert, well developed, in no acute distress. HEENT: Conjunctivae and lids unremarkable. Cardiovascular: Regular rhythm.  Lungs: Normal work of breathing. Neurologic: No focal deficits.   Lab Results  Component Value Date   CREATININE 0.63 03/17/2020   BUN 17 03/17/2020   NA 139 03/17/2020   K 4.3 03/17/2020   CL 101 03/17/2020   CO2 18 (L) 03/17/2020   Lab Results  Component Value Date   ALT 57 (H) 03/17/2020   AST 42 (H) 03/17/2020   ALKPHOS 105 03/17/2020   BILITOT 0.4 03/17/2020   Lab Results  Component Value Date   HGBA1C 5.2 03/17/2020   Lab Results  Component Value Date   INSULIN 6.4 03/17/2020   Lab Results  Component Value Date   TSH 2.38 02/01/2020   Lab Results  Component Value Date   CHOL 233 (H) 03/17/2020   HDL 45 03/17/2020   LDLCALC 144 (H) 03/17/2020   LDLDIRECT 135.0 09/17/2018   TRIG 242 (H)  03/17/2020   CHOLHDL 5.0 (H) 11/20/2019   Lab Results  Component Value Date   WBC 9.7 02/01/2020   HGB 14.7 02/01/2020   HCT 43.0 02/01/2020   MCV 90.3 02/01/2020   PLT 258.0 02/01/2020   No results found for: IRON, TIBC, FERRITIN  Attestation Statements:   Reviewed by clinician on day of visit: allergies, medications, problem list, medical history, surgical history, family history, social history, and previous encounter notes.   I, Trixie Dredge, am acting as transcriptionist for Dennard Nip, MD.  I have reviewed the above documentation for accuracy and completeness, and I agree with the above. - Dennard Nip, MD

## 2020-03-25 DIAGNOSIS — N6315 Unspecified lump in the right breast, overlapping quadrants: Secondary | ICD-10-CM | POA: Diagnosis not present

## 2020-03-25 DIAGNOSIS — R928 Other abnormal and inconclusive findings on diagnostic imaging of breast: Secondary | ICD-10-CM | POA: Diagnosis not present

## 2020-03-25 DIAGNOSIS — N6341 Unspecified lump in right breast, subareolar: Secondary | ICD-10-CM | POA: Diagnosis not present

## 2020-03-29 ENCOUNTER — Other Ambulatory Visit: Payer: Self-pay

## 2020-03-29 ENCOUNTER — Ambulatory Visit: Payer: BC Managed Care – PPO | Attending: Family Medicine | Admitting: Physical Therapy

## 2020-03-29 DIAGNOSIS — R279 Unspecified lack of coordination: Secondary | ICD-10-CM | POA: Insufficient documentation

## 2020-03-29 DIAGNOSIS — M6281 Muscle weakness (generalized): Secondary | ICD-10-CM | POA: Diagnosis not present

## 2020-03-30 ENCOUNTER — Encounter: Payer: Self-pay | Admitting: Physical Therapy

## 2020-03-30 NOTE — Therapy (Signed)
[facility] [address] [place], [place], [address] Phone: [phone]   Fax:  [phone]  Physical Therapy Evaluation  Patient Details  Name: Erika Garrett MRN: [id number] Date of Birth: [birth year] Referring Provider (PT): Orma Flaming, MD   Encounter Date: 03/29/2020   PT End of Session - 03/29/20 1108    Visit Number 1    Date for PT Re-Evaluation 06/21/20    Authorization Type bcbs    PT Start Time 1102    PT Stop Time 1142    PT Time Calculation (min) 40 min    Activity Tolerance Patient tolerated treatment well    Behavior During Therapy [facility] for tasks assessed/performed           Past Medical History:  Diagnosis Date  . Allergy   . Anxiety   . Back pain   . Bilateral swelling of feet   . Constipation   . Depression   . Elevated liver enzymes   . Endometriosis   . Fibroids 09/17/2018  . Hyperlipidemia    hx of, no medications  . Hypertension 2016   Intercranial Hypertension  . Intracranial hypertension   . Joint pain   . Other fatigue   . 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 Shortness of breath on exertion   . Subclinical hypothyroidism   . Ventral hernia   . Vitamin D deficiency     Past Surgical History:  Procedure Laterality Date  . ABLATION ON ENDOMETRIOSIS    . ROBOTIC ASSISTED LAPAROSCOPIC PARTIAL CYSTECTOMY      There were no vitals filed for this visit.    Subjective Assessment - 03/29/20 1103    Subjective Pt had hysterectomy and appendix removed 11/2020. Pt had eno her whole life so things were attached.  Pt states no bowel symptoms.  pt states it feels like things are working better now.  Pt states she just feels weak. Denies pain currently.    Patient Stated Goals get back to walking, feel core is stronger, keep the scar tissue loose    Currently in Pain? No/denies              [facility] PT Assessment - 03/30/20 0001      Assessment   Medical Diagnosis  N81.89 (ICD-10-CM) - Pelvic floor weakness in female    Referring Provider (PT) Orma Flaming, MD    Prior Therapy prior to surgery      Precautions   Precautions None      Restrictions   Weight Bearing Restrictions No      Balance Screen   Has the patient fallen in the past 6 months No      [place] residence    Living Arrangements Spouse/significant other      Prior Function   Level of Independence Independent    Vocation Full time employment    Vocation Requirements travels a lot    Leisure workouts that are easy to keep up with in hotel or on the road      Cognition   Overall Cognitive Status Within Functional Limits for tasks assessed      Posture/Postural Control   Posture/Postural Control Postural limitations    Postural Limitations Rounded Shoulders;Forward head;Increased thoracic kyphosis;Anterior pelvic tilt      ROM / Strength   AROM / PROM / Strength Strength;PROM      PROM   Overall PROM Comments hip IR 50% on left side  Strength   Overall Strength Comments Lt hip abduction 4/5; bilat add 4/5      Flexibility   Soft Tissue Assessment /Muscle Length yes    Hamstrings normal      Palpation   Palpation comment Rt abdominal wall lower restricted;  adductors tight      Ambulation/Gait   Gait Pattern Within Functional Limits                      Objective measurements completed on examination: See above findings.     Pelvic Floor Special Questions - 03/30/20 0001    Currently Sexually Active Yes    Marinoff Scale pain prevents any attempts at intercourse    Urinary Leakage Yes    How often laughing hard caused a little leakage    Activities that cause leaking Laughing    Urinary urgency Yes    Fecal incontinence No    Falling out feeling (prolapse) No    Pelvic Floor Internal Exam pt informed and identity confimred fo rinternal soft tissue assessment    Exam Type Vaginal    Palpation tight  and tender Rt>LT    Strength fair squeeze, definite lift    Strength # of reps 1   5 quick   Strength # of seconds 4    Tone high                         PT Long Term Goals - 03/30/20 1732      PT LONG TERM GOAL #1   Title Pt will be ind with advanced HEP    Time 12    Period Weeks    Status New    Target Date 06/21/20      PT LONG TERM GOAL #2   Title Pt will report feeling back at least 75% back to normal strength in her core.    Time 12    Period Weeks    Status New    Target Date 06/21/20      PT LONG TERM GOAL #3   Title Pt will be 5/5 strength in bilateral hips for improved gait mechanics for return to walking program    Time 12    Period Weeks    Status New    Target Date 06/21/20      PT LONG TERM GOAL #4   Title pt will report only 1/3 Marinoff scale at most    Baseline 3/3    Time 12    Period Weeks    Status New    Target Date 06/21/20      PT LONG TERM GOAL #5   Title Pt will be able to laugh for as long as she wants without feeling like she will have leakage    Time 12    Period Weeks    Status New    Target Date 06/21/20                  Plan - 03/30/20 1739    Clinical Impression Statement Pt presents to clinic s/p hysterectomy with ovaries spared.  Pt also had to have the appendix removed.  Surgery was due to ongoing endometriosis that causes adhesions to the bladder and rectum. Pt has very high tone pelvlc floor and decreased enduranced and decreaesd coordination of the contraction as she can only do 5 in 10 seconds due to slower time to contract and relax. Pt has core and  hip weakness and tight adductors.  Pt also has posture abnormalities as mentioned above.  Pt will benefit from skilled PT to address impairments and restore to maximum function.    Personal Factors and Comorbidities Comorbidity 3+    Comorbidities endo, hysterectomy, appendix removed    Examination-Activity Limitations Continence;Lift     Examination-Participation Restrictions Community Activity;Interpersonal Relationship    Stability/Clinical Decision Making Evolving/Moderate complexity    Clinical Decision Making Moderate    Rehab Potential Excellent    PT Frequency 2x / week    PT Duration 12 weeks    PT Treatment/Interventions ADLs/Self Care Home Management;Biofeedback;Cryotherapy;Software engineer;Therapeutic activities;Therapeutic exercise;Neuromuscular re-education;Patient/family education;Manual techniques;Dry needling;Passive range of motion;Taping    PT Next Visit Plan pelvic floor and abdominal fascial release, breathing with stretches    Consulted and Agree with Plan of Care Patient           Patient will benefit from skilled therapeutic intervention in order to improve the following deficits and impairments:  Pain,Postural dysfunction,Decreased strength,Impaired flexibility,Impaired tone,Increased fascial restricitons,Difficulty walking,Decreased range of motion,Decreased coordination,Increased muscle spasms  Visit Diagnosis: Muscle weakness (generalized)  Unspecified lack of coordination     Problem List Patient Active Problem List   Diagnosis Date Noted  . maternal ovarian cancer  11/20/2019  . Allergic contact dermatitis 08/23/2019  . Chronic rhinitis 08/13/2019  . Endometriosis of pelvis 06/25/2017  . Subclinical hypothyroidism 06/25/2017  . Obesity (BMI 30-39.9) 06/25/2017  . Pseudotumor cerebri induced by OCPs 05/22/2017    Jule Ser, PT 03/30/2020, 5:54 PM   [facility] [address] [place], [place], [address] Phone: [phone]   Fax:  [phone]  Name: Erika Garrett MRN: [id number] Date of Birth: 06-11-1980

## 2020-03-31 ENCOUNTER — Other Ambulatory Visit: Payer: Self-pay

## 2020-03-31 ENCOUNTER — Ambulatory Visit (INDEPENDENT_AMBULATORY_CARE_PROVIDER_SITE_OTHER): Payer: BC Managed Care – PPO | Admitting: Family Medicine

## 2020-03-31 VITALS — BP 110/77 | Ht 67.0 in | Wt 196.0 lb

## 2020-03-31 DIAGNOSIS — E782 Mixed hyperlipidemia: Secondary | ICD-10-CM | POA: Diagnosis not present

## 2020-03-31 DIAGNOSIS — Z6830 Body mass index (BMI) 30.0-30.9, adult: Secondary | ICD-10-CM

## 2020-03-31 DIAGNOSIS — E783 Hyperchylomicronemia: Secondary | ICD-10-CM

## 2020-03-31 DIAGNOSIS — E669 Obesity, unspecified: Secondary | ICD-10-CM | POA: Diagnosis not present

## 2020-03-31 DIAGNOSIS — R7989 Other specified abnormal findings of blood chemistry: Secondary | ICD-10-CM | POA: Diagnosis not present

## 2020-04-01 DIAGNOSIS — D241 Benign neoplasm of right breast: Secondary | ICD-10-CM | POA: Diagnosis not present

## 2020-04-01 DIAGNOSIS — N6310 Unspecified lump in the right breast, unspecified quadrant: Secondary | ICD-10-CM | POA: Diagnosis not present

## 2020-04-01 DIAGNOSIS — N6315 Unspecified lump in the right breast, overlapping quadrants: Secondary | ICD-10-CM | POA: Diagnosis not present

## 2020-04-01 DIAGNOSIS — R9389 Abnormal findings on diagnostic imaging of other specified body structures: Secondary | ICD-10-CM | POA: Diagnosis not present

## 2020-04-05 ENCOUNTER — Encounter: Payer: Self-pay | Admitting: Family Medicine

## 2020-04-05 NOTE — Progress Notes (Signed)
Chief Complaint:   OBESITY Jaydn is here to discuss her progress with her obesity treatment plan along with follow-up of her obesity related diagnoses. Peggyann is on the Category 2 Plan and states she is following her eating plan approximately 100% of the time. Jakaiya states she is doing 0 minutes 0 times per week.  Today's visit was #: 2 Starting weight: 200 lbs Starting date: 03/17/2020 Today's weight: 196 lbs Today's date: 03/31/2020 Total lbs lost to date: 4 Total lbs lost since last in-office visit: 4  Interim History: Chaslyn did very well with weight loss on her Category 2 plan. Her hunger was mostly controlled, but she felt bored with her food choices. She did note some stress reaction to food.  Subjective:   1. Elevated LFTs Kayah has a history of elevated LFTs. Her ALT improved but is still elevated. She has been worked up with a negative abdominal ultrasound and MRI. She is doing well with weight loss. I discussed labs with the patient today.  2. Mixed hyperlipidemia Zyria's LDL and triglycerides are elevated on statin. She denies a family history of hyperlipidemia. She is doing well with diet and weight loss. I discussed labs with the patient today.  Assessment/Plan:   1. Elevated LFTs We discussed the likely diagnosis of non-alcoholic fatty liver disease today and how this condition is obesity related. Myriah was educated the importance of weight loss. Lorette agreed to continue with her weight loss efforts with healthier diet and exercise as an essential part of her treatment plan.  2. Mixed hyperlipidemia Cardiovascular risk and specific lipid/LDL goals reviewed.  We discussed several lifestyle modifications today and Vaudie will continue to work on diet, exercise and weight loss efforts. We will recheck labs in 3 months. Orders and follow up as documented in patient record.   3. Class 1 obesity with serious comorbidity and body mass index (BMI) of 30.0 to 30.9 in adult, unspecified  obesity type Lanett is currently in the action stage of change. As such, her goal is to continue with weight loss efforts. She has agreed to the Category 2 Plan or keeping a food journal and adhering to recommended goals of 1200-1500 calories and 85+ grams of protein daily.   Behavioral modification strategies: increasing lean protein intake, meal planning and cooking strategies and keeping a strict food journal.  Seylah has agreed to follow-up with our clinic in 2 weeks. She was informed of the importance of frequent follow-up visits to maximize her success with intensive lifestyle modifications for her multiple health conditions.   Objective:   Blood pressure 110/77, height 5\' 7"  (1.702 m), weight 196 lb (88.9 kg). Body mass index is 30.7 kg/m.  General: Cooperative, alert, well developed, in no acute distress. HEENT: Conjunctivae and lids unremarkable. Cardiovascular: Regular rhythm.  Lungs: Normal work of breathing. Neurologic: No focal deficits.   Lab Results  Component Value Date   CREATININE 0.63 03/17/2020   BUN 17 03/17/2020   NA 139 03/17/2020   K 4.3 03/17/2020   CL 101 03/17/2020   CO2 18 (L) 03/17/2020   Lab Results  Component Value Date   ALT 57 (H) 03/17/2020   AST 42 (H) 03/17/2020   ALKPHOS 105 03/17/2020   BILITOT 0.4 03/17/2020   Lab Results  Component Value Date   HGBA1C 5.2 03/17/2020   Lab Results  Component Value Date   INSULIN 6.4 03/17/2020   Lab Results  Component Value Date   TSH 2.38 02/01/2020  Lab Results  Component Value Date   CHOL 233 (H) 03/17/2020   HDL 45 03/17/2020   LDLCALC 144 (H) 03/17/2020   LDLDIRECT 135.0 09/17/2018   TRIG 242 (H) 03/17/2020   CHOLHDL 5.0 (H) 11/20/2019   Lab Results  Component Value Date   WBC 9.7 02/01/2020   HGB 14.7 02/01/2020   HCT 43.0 02/01/2020   MCV 90.3 02/01/2020   PLT 258.0 02/01/2020   No results found for: IRON, TIBC, FERRITIN  Attestation Statements:   Reviewed by clinician on  day of visit: allergies, medications, problem list, medical history, surgical history, family history, social history, and previous encounter notes.  Time spent on visit including pre-visit chart review and post-visit care and charting was 65 minutes.    I, Trixie Dredge, am acting as transcriptionist for Dennard Nip, MD.  I have reviewed the above documentation for accuracy and completeness, and I agree with the above. -  Dennard Nip, MD

## 2020-04-14 ENCOUNTER — Encounter (INDEPENDENT_AMBULATORY_CARE_PROVIDER_SITE_OTHER): Payer: Self-pay | Admitting: Physician Assistant

## 2020-04-14 ENCOUNTER — Other Ambulatory Visit: Payer: Self-pay

## 2020-04-14 ENCOUNTER — Ambulatory Visit (INDEPENDENT_AMBULATORY_CARE_PROVIDER_SITE_OTHER): Payer: BC Managed Care – PPO | Admitting: Physician Assistant

## 2020-04-14 VITALS — BP 122/85 | HR 91 | Temp 98.1°F | Ht 67.0 in | Wt 195.0 lb

## 2020-04-14 DIAGNOSIS — E669 Obesity, unspecified: Secondary | ICD-10-CM

## 2020-04-14 DIAGNOSIS — E559 Vitamin D deficiency, unspecified: Secondary | ICD-10-CM | POA: Diagnosis not present

## 2020-04-14 DIAGNOSIS — Z6831 Body mass index (BMI) 31.0-31.9, adult: Secondary | ICD-10-CM

## 2020-04-14 DIAGNOSIS — Z9189 Other specified personal risk factors, not elsewhere classified: Secondary | ICD-10-CM

## 2020-04-14 DIAGNOSIS — G479 Sleep disorder, unspecified: Secondary | ICD-10-CM | POA: Diagnosis not present

## 2020-04-19 NOTE — Progress Notes (Signed)
Chief Complaint:   OBESITY Erika Garrett is here to discuss her progress with her obesity treatment plan along with follow-up of her obesity related diagnoses. [name] is on the Category 2 Plan or keeping a food journal and adhering to recommended goals of 1200-1500 calories and 85+ grams of protein daily and states she is following her eating plan approximately 100% of the time. [name] states she is doing 0 minutes 0 times per week.  Today's visit was #: 3 Starting weight: 200 lbs Starting date: 03/17/2020 Today's weight: 195 lbs Today's date: 04/14/2020 Total lbs lost to date: 5 Total lbs lost since last in-office visit: 1  Interim History: Erika Garrett reports that she has followed the plan well and she is frustrated with lack of weight loss. She states she is impatient and would like her weight loss to be faster. She is not hungry throughout the day. She is eating Category 2 and does not want to journal.  Subjective:   1. Vitamin D deficiency Erika Garrett is on OTC Vit D. Last Vit D level was not at goal.  2. Sleep disturbance Erika Garrett is taking melatonin 5 mg daily. She notes grinding her teeth when she doe not take melatonin.  3. At risk for osteoporosis Erika Garrett is at higher risk of osteopenia and osteoporosis due to Vitamin D deficiency.   Assessment/Plan:   1. Vitamin D deficiency Low Vitamin D level contributes to fatigue and are associated with obesity, breast, and colon cancer. Shondrea agreed to continue taking Vitamin D and will follow-up for routine testing of Vitamin D, at least 2-3 times per year to avoid over-replacement.  2. Sleep disturbance Kaylor agreed to continue taking melatonin 5 mg nightly. Sleep information sheet was given to the patient today.  3. At risk for osteoporosis Erika Garrett was given approximately 15 minutes of osteoporosis prevention counseling today. Erika Garrett is at risk for osteopenia and osteoporosis due to her Vitamin D deficiency. She was encouraged to take her Vitamin D and follow her  higher calcium diet and increase strengthening exercise to help strengthen her bones and decrease her risk of osteopenia and osteoporosis.  Repetitive spaced learning was employed today to elicit superior memory formation and behavioral change.  4. Class 1 obesity with serious comorbidity and body mass index (BMI) of 31.0 to 31.9 in adult, unspecified obesity type Erika Garrett is currently in the action stage of change. As such, her goal is to continue with weight loss efforts. She has agreed to the Category 2 Plan + 100-200 protein calories.   Exercise goals: No exercise has been prescribed at this time.  Behavioral modification strategies: meal planning and cooking strategies and keeping healthy foods in the home.  Erika Garrett has agreed to follow-up with our clinic in 2 weeks. She was informed of the importance of frequent follow-up visits to maximize her success with intensive lifestyle modifications for her multiple health conditions.   Objective:   Blood pressure 122/85, pulse 91, temperature 98.1 F (36.7 C), height 5\' 7"  (1.702 m), weight 195 lb (88.5 kg), last menstrual period 11/27/2019, SpO2 98 %. Body mass index is 30.54 kg/m.  General: Cooperative, alert, well developed, in no acute distress. HEENT: Conjunctivae and lids unremarkable. Cardiovascular: Regular rhythm.  Lungs: Normal work of breathing. Neurologic: No focal deficits.   Lab Results  Component Value Date   CREATININE 0.63 03/17/2020   BUN 17 03/17/2020   NA 139 03/17/2020   K 4.3 03/17/2020   CL 101 03/17/2020   CO2 18 (L)  03/17/2020   Lab Results  Component Value Date   ALT 57 (H) 03/17/2020   AST 42 (H) 03/17/2020   ALKPHOS 105 03/17/2020   BILITOT 0.4 03/17/2020   Lab Results  Component Value Date   HGBA1C 5.2 03/17/2020   Lab Results  Component Value Date   INSULIN 6.4 03/17/2020   Lab Results  Component Value Date   TSH 2.38 02/01/2020   Lab Results  Component Value Date   CHOL 233 (H) 03/17/2020    HDL 45 03/17/2020   LDLCALC 144 (H) 03/17/2020   LDLDIRECT 135.0 09/17/2018   TRIG 242 (H) 03/17/2020   CHOLHDL 5.0 (H) 11/20/2019   Lab Results  Component Value Date   WBC 9.7 02/01/2020   HGB 14.7 02/01/2020   HCT 43.0 02/01/2020   MCV 90.3 02/01/2020   PLT 258.0 02/01/2020   No results found for: IRON, TIBC, FERRITIN  Attestation Statements:   Reviewed by clinician on day of visit: allergies, medications, problem list, medical history, surgical history, family history, social history, and previous encounter notes.   Wilhemena Durie, am acting as transcriptionist for [employer], PA-C.  I have reviewed the above documentation for accuracy and completeness, and I agree with the above. Abby Potash, PA-C

## 2020-05-03 ENCOUNTER — Ambulatory Visit (INDEPENDENT_AMBULATORY_CARE_PROVIDER_SITE_OTHER): Payer: BC Managed Care – PPO | Admitting: Family Medicine

## 2020-05-03 ENCOUNTER — Other Ambulatory Visit: Payer: Self-pay

## 2020-05-03 ENCOUNTER — Encounter (INDEPENDENT_AMBULATORY_CARE_PROVIDER_SITE_OTHER): Payer: Self-pay | Admitting: Family Medicine

## 2020-05-03 VITALS — BP 113/78 | HR 94 | Temp 98.3°F | Ht 67.0 in | Wt 191.0 lb

## 2020-05-03 DIAGNOSIS — E669 Obesity, unspecified: Secondary | ICD-10-CM

## 2020-05-03 DIAGNOSIS — Z6831 Body mass index (BMI) 31.0-31.9, adult: Secondary | ICD-10-CM | POA: Diagnosis not present

## 2020-05-03 DIAGNOSIS — E7849 Other hyperlipidemia: Secondary | ICD-10-CM

## 2020-05-10 ENCOUNTER — Ambulatory Visit: Payer: BC Managed Care – PPO | Attending: Family Medicine | Admitting: Physical Therapy

## 2020-05-10 ENCOUNTER — Encounter: Payer: Self-pay | Admitting: Physical Therapy

## 2020-05-10 ENCOUNTER — Other Ambulatory Visit: Payer: Self-pay

## 2020-05-10 DIAGNOSIS — M6281 Muscle weakness (generalized): Secondary | ICD-10-CM | POA: Diagnosis not present

## 2020-05-10 DIAGNOSIS — R279 Unspecified lack of coordination: Secondary | ICD-10-CM | POA: Insufficient documentation

## 2020-05-10 NOTE — Patient Instructions (Signed)
Access Code: [email or web address] URL: [email or web address] Date: 05/10/2020 Prepared by: Jari Favre  Exercises Sidelying Thoracic and Shoulder Rotation - 1 x daily - 7 x weekly - 2 sets - 10 reps - 5 sec hold Child's Pose with Sidebending - 1 x daily - 7 x weekly - 1 sets - 3 reps - 30 sec hold Thoracic Sidebending on Swiss Ball - 1 x daily - 7 x weekly - 3 sets - 10 reps Thoracic Cervical Extension on Swiss Ball - 1 x daily - 7 x weekly - 3 sets - 10 reps Seated Thoracic Flexion and Rotation with Swiss Ball - 1 x daily - 7 x weekly - 3 sets - 10 reps

## 2020-05-10 NOTE — Therapy (Signed)
[facility] [address], [place], [place], [address] Phone: [phone]   Fax:  [phone]  Physical Therapy Treatment  Patient Details  Name: Erika Garrett MRN: [id number] Date of Birth: 23-Apr-1980 Referring Provider (PT): Orma Flaming, MD   Encounter Date: 05/10/2020   PT End of Session - 05/10/20 1453    Visit Number 2    Date for PT Re-Evaluation 06/21/20    Authorization Type bcbs    PT Start Time 1142    PT Stop Time 1232    PT Time Calculation (min) 50 min    Activity Tolerance Patient tolerated treatment well    Behavior During Therapy [facility] for tasks assessed/performed           Past Medical History:  Diagnosis Date  . Allergy   . Anxiety   . Back pain   . Bilateral swelling of feet   . Constipation   . Depression   . Elevated liver enzymes   . Endometriosis   . Fibroids 09/17/2018  . Hyperlipidemia    hx of, no medications  . Hypertension 2016   Intercranial Hypertension  . Intracranial hypertension   . Joint pain   . Other fatigue   . 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 Shortness of breath on exertion   . Subclinical hypothyroidism   . Ventral hernia   . Vitamin D deficiency     Past Surgical History:  Procedure Laterality Date  . ABLATION ON ENDOMETRIOSIS    . ROBOTIC ASSISTED LAPAROSCOPIC PARTIAL CYSTECTOMY      There were no vitals filed for this visit.   Subjective Assessment - 05/10/20 1457    Subjective no pain just tight and can't get it stretched out on the Rt side    Patient Stated Goals get back to walking, feel core is stronger, keep the scar tissue loose    Currently in Pain? No/denies                             [facility] Adult PT Treatment/Exercise - 05/10/20 0001      Neuro Re-ed    Neuro Re-ed Details  breathng with diaphragm      Exercises   Exercises Lumbar      Lumbar Exercises: Stretches   Active Hamstring Stretch  Right;Left;20 seconds    Lower Trunk Rotation Limitations sidelying rotation    Other Lumbar Stretch Exercise ball roll and child pose with side bend    Other Lumbar Stretch Exercise ext and side bend on pball      Manual Therapy   Manual Therapy Myofascial release;Soft tissue mobilization    Soft tissue mobilization lumbar paraspinals, obliques, lats, QL    Myofascial Release Rt trunk, thoracolumbar                  PT Education - 05/10/20 1452    Education Details Access Code: [email or web address]    Person(s) Educated Patient    Methods Explanation;Demonstration;Tactile cues;Verbal cues;Handout    Comprehension Verbalized understanding;Returned demonstration               PT Long Term Goals - 03/30/20 1732      PT LONG TERM GOAL #1   Title Pt will be ind with advanced HEP    Time 12    Period Weeks    Status New    Target Date 06/21/20      PT LONG  TERM GOAL #2   Title Pt will report feeling back at least 75% back to normal strength in her core.    Time 12    Period Weeks    Status New    Target Date 06/21/20      PT LONG TERM GOAL #3   Title Pt will be 5/5 strength in bilateral hips for improved gait mechanics for return to walking program    Time 12    Period Weeks    Status New    Target Date 06/21/20      PT LONG TERM GOAL #4   Title pt will report only 1/3 Marinoff scale at most    Baseline 3/3    Time 12    Period Weeks    Status New    Target Date 06/21/20      PT LONG TERM GOAL #5   Title Pt will be able to laugh for as long as she wants without feeling like she will have leakage    Time 12    Period Weeks    Status New    Target Date 06/21/20                 Plan - 05/10/20 1444    Clinical Impression Statement Pt had good release to fascia and muscles on Rt side of the torso including latissimus QL, lumbar paraspinals, obliques.  Pt was able to add stretches to HEP that targeted muscles released during session in order to maintain  muscle length.  PT discussed dry needling but patient was not open to the idea as she had it previously and did not like it.  Pt wil benefit from skilled PT to address muscle tension and core strength.    Examination-Participation Restrictions Community Activity;Interpersonal Relationship    PT Treatment/Interventions ADLs/Self Care Home Management;Biofeedback;Cryotherapy;Software engineer;Therapeutic activities;Therapeutic exercise;Neuromuscular re-education;Patient/family education;Manual techniques;Dry needling;Passive range of motion;Taping    PT Next Visit Plan breathing and f/u on stretches, internal STM if tolerated or external to asses tension, OI externally STM, core strength    PT Home Exercise Plan Access Code: [email or web address]    Consulted and Agree with Plan of Care Patient           Patient will benefit from skilled therapeutic intervention in order to improve the following deficits and impairments:  Pain,Postural dysfunction,Decreased strength,Impaired flexibility,Impaired tone,Increased fascial restricitons,Difficulty walking,Decreased range of motion,Decreased coordination,Increased muscle spasms  Visit Diagnosis: Muscle weakness (generalized)  Unspecified lack of coordination     Problem List Patient Active Problem List   Diagnosis Date Noted  . maternal ovarian cancer  11/20/2019  . Allergic contact dermatitis 08/23/2019  . Chronic rhinitis 08/13/2019  . Endometriosis of pelvis 06/25/2017  . Subclinical hypothyroidism 06/25/2017  . Obesity (BMI 30-39.9) 06/25/2017  . Pseudotumor cerebri induced by OCPs 05/22/2017    Jule Ser, PT 05/10/2020, 2:58 PM  [facility] [address] [place], [place], [address] Phone: [phone]   Fax:  [phone]  Name: Erika Garrett MRN: [id number] Date of Birth: 1980/05/25

## 2020-05-11 NOTE — Progress Notes (Signed)
Chief Complaint:   OBESITY [name] is here to discuss her progress with her obesity treatment plan along with follow-up of her obesity related diagnoses. Erika Garrett is on the Category 2 Plan + 100-200 protein calories and states she is following her eating plan approximately 80% of the time. Erika Garrett states she is doing 0 minutes 0 times per week.  Today's visit was #: 4 Starting weight: 200 lbs Starting date: 03/17/2020 Today's weight: 191 lbs Today's date: 05/03/2020 Total lbs lost to date: 9 Total lbs lost since last in-office visit: 4  Interim History: Erika Garrett continues to do well with weight loss even with extra challenges. Her hunger is mostly controlled and she sometimes struggles to eat everything on her plan.  Subjective:   1. Other hyperlipidemia Erika Garrett continues to do very well with weight loss. She is on a lower cholesterol diet and this should help improve her labs.  Assessment/Plan:   1. Other hyperlipidemia Cardiovascular risk and specific lipid/LDL goals reviewed. We discussed several lifestyle modifications today. Erika Garrett will continue to work on diet, exercise and weight loss efforts. We will recheck labs in 4-6 weeks. Orders and follow up as documented in patient record.   2. Obesity with current BMI of 29.9 Erika Garrett is currently in the action stage of change. As such, her goal is to continue with weight loss efforts. She has agreed to the Category 2 Plan.   Behavioral modification strategies: no skipping meals and meal planning and cooking strategies.  Erika Garrett has agreed to follow-up with our clinic in 2 to 3 weeks. She was informed of the importance of frequent follow-up visits to maximize her success with intensive lifestyle modifications for her multiple health conditions.   Objective:   Blood pressure 113/78, pulse 94, temperature 98.3 F (36.8 C), height 5\' 7"  (1.702 m), weight 191 lb (86.6 kg), last menstrual period 11/27/2019, SpO2 98 %. Body mass index is 29.91  kg/m.  General: Cooperative, alert, well developed, in no acute distress. HEENT: Conjunctivae and lids unremarkable. Cardiovascular: Regular rhythm.  Lungs: Normal work of breathing. Neurologic: No focal deficits.   Lab Results  Component Value Date   CREATININE 0.63 03/17/2020   BUN 17 03/17/2020   NA 139 03/17/2020   K 4.3 03/17/2020   CL 101 03/17/2020   CO2 18 (L) 03/17/2020   Lab Results  Component Value Date   ALT 57 (H) 03/17/2020   AST 42 (H) 03/17/2020   ALKPHOS 105 03/17/2020   BILITOT 0.4 03/17/2020   Lab Results  Component Value Date   HGBA1C 5.2 03/17/2020   Lab Results  Component Value Date   INSULIN 6.4 03/17/2020   Lab Results  Component Value Date   TSH 2.38 02/01/2020   Lab Results  Component Value Date   CHOL 233 (H) 03/17/2020   HDL 45 03/17/2020   LDLCALC 144 (H) 03/17/2020   LDLDIRECT 135.0 09/17/2018   TRIG 242 (H) 03/17/2020   CHOLHDL 5.0 (H) 11/20/2019   Lab Results  Component Value Date   WBC 9.7 02/01/2020   HGB 14.7 02/01/2020   HCT 43.0 02/01/2020   MCV 90.3 02/01/2020   PLT 258.0 02/01/2020   No results found for: IRON, TIBC, FERRITIN  Attestation Statements:   Reviewed by clinician on day of visit: allergies, medications, problem list, medical history, surgical history, family history, social history, and previous encounter notes.  Time spent on visit including pre-visit chart review and post-visit care and charting was 32 minutes.  I, Trixie Dredge, am acting as transcriptionist for Dennard Nip, MD.  I have reviewed the above documentation for accuracy and completeness, and I agree with the above. -  Dennard Nip, MD

## 2020-05-17 ENCOUNTER — Other Ambulatory Visit: Payer: Self-pay

## 2020-05-17 ENCOUNTER — Ambulatory Visit: Payer: BC Managed Care – PPO | Admitting: Physical Therapy

## 2020-05-17 DIAGNOSIS — R279 Unspecified lack of coordination: Secondary | ICD-10-CM

## 2020-05-17 DIAGNOSIS — M6281 Muscle weakness (generalized): Secondary | ICD-10-CM | POA: Diagnosis not present

## 2020-05-17 NOTE — Patient Instructions (Signed)
Access Code: [email or web address] URL: [email or web address] Date: 05/17/2020 Prepared by: Jari Favre  Exercises Sidelying Thoracic and Shoulder Rotation - 1 x daily - 7 x weekly - 2 sets - 10 reps - 5 sec hold Child's Pose with Sidebending - 1 x daily - 7 x weekly - 1 sets - 3 reps - 30 sec hold Thoracic Sidebending on Swiss Ball - 1 x daily - 7 x weekly - 3 sets - 10 reps Thoracic Cervical Extension on Swiss Ball - 1 x daily - 7 x weekly - 3 sets - 10 reps Seated Thoracic Flexion and Rotation with Swiss Ball - 1 x daily - 7 x weekly - 3 sets - 10 reps Standing Low Back Flexion at Table - 3 x daily - 7 x weekly - 1 sets - 8 reps

## 2020-05-17 NOTE — Therapy (Signed)
[facility] [address], [place], [place], [address] Phone: [phone]   Fax:  [phone]  Physical Therapy Treatment  Patient Details  Name: Erika Garrett MRN: [id number] Date of Birth: 1980-03-01 Referring Provider (PT): Orma Flaming, MD   Encounter Date: 05/17/2020   PT End of Session - 05/17/20 1253    Visit Number 3    Date for PT Re-Evaluation 06/21/20    Authorization Type bcbs    PT Start Time 1145    PT Stop Time 3825    PT Time Calculation (min) 50 min    Activity Tolerance Patient tolerated treatment well    Behavior During Therapy [facility] for tasks assessed/performed           Past Medical History:  Diagnosis Date  . Allergy   . Anxiety   . Back pain   . Bilateral swelling of feet   . Constipation   . Depression   . Elevated liver enzymes   . Endometriosis   . Fibroids 09/17/2018  . Hyperlipidemia    hx of, no medications  . Hypertension 2016   Intercranial Hypertension  . Intracranial hypertension   . Joint pain   . Other fatigue   . 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 Shortness of breath on exertion   . Subclinical hypothyroidism   . Ventral hernia   . Vitamin D deficiency     Past Surgical History:  Procedure Laterality Date  . ABLATION ON ENDOMETRIOSIS    . ROBOTIC ASSISTED LAPAROSCOPIC PARTIAL CYSTECTOMY      There were no vitals filed for this visit.   Subjective Assessment - 05/17/20 1254    Subjective I feel tight all around the abdomen.  I feel like I can't get the muscles to relax    Patient Stated Goals get back to walking, feel core is stronger, keep the scar tissue loose    Currently in Pain? No/denies                             [facility] Adult PT Treatment/Exercise - 05/17/20 0001      Lumbar Exercises: Stretches   Press Ups 10 reps    Other Lumbar Stretch Exercise hip flex or SKTC on foam roll -      Lumbar Exercises: Standing    Other Standing Lumbar Exercises kegel 25-50% contract and relax - standing on mat      Manual Therapy   Manual Therapy Soft tissue mobilization;Joint mobilization    Joint Mobilization thoracic ext mobs grade II-III    Soft tissue mobilization thoracic paraspinal - rectus abdominus                  PT Education - 05/17/20 1255    Education Details Access Code: [email or web address]    Person(s) Educated Patient    Methods Explanation;Demonstration;Tactile cues;Verbal cues;Handout    Comprehension Verbalized understanding;Returned demonstration               PT Long Term Goals - 05/17/20 1228      PT LONG TERM GOAL #1   Title Pt will be ind with advanced HEP    Status On-going      PT LONG TERM GOAL #2   Title Pt will report feeling back at least 75% back to normal strength in her core.    Status On-going  Plan - 05/17/20 1240    Clinical Impression Statement Pt was educated on ways to reduce muscle spasms today.  She has had some improvement from stretches on the ball but states that works for about 1 hour. Overall, she is making steady progress and is being given a lot of self care activities.  Pt began basic strengthening but still focus on stretch and relax techniques.  New stretches and additions to HEP as mentioned above.  Pt will benefit from skilled PT to continue to address strength with muscle coordination and reduced muscle spasms for full return to functional activities.    PT Treatment/Interventions ADLs/Self Care Home Management;Biofeedback;Cryotherapy;Software engineer;Therapeutic activities;Therapeutic exercise;Neuromuscular re-education;Patient/family education;Manual techniques;Dry needling;Passive range of motion;Taping    PT Next Visit Plan f/u on adding kegel in standing flexion, f/u on pelvic floor muscle spasm and internal re-assess/STM if needed, progress core strength if tolerated    PT Home Exercise Plan  Access Code: [email or web address]    Consulted and Agree with Plan of Care Patient           Patient will benefit from skilled therapeutic intervention in order to improve the following deficits and impairments:  Pain,Postural dysfunction,Decreased strength,Impaired flexibility,Impaired tone,Increased fascial restricitons,Difficulty walking,Decreased range of motion,Decreased coordination,Increased muscle spasms  Visit Diagnosis: Muscle weakness (generalized)  Unspecified lack of coordination     Problem List Patient Active Problem List   Diagnosis Date Noted  . maternal ovarian cancer  11/20/2019  . Allergic contact dermatitis 08/23/2019  . Chronic rhinitis 08/13/2019  . Endometriosis of pelvis 06/25/2017  . Subclinical hypothyroidism 06/25/2017  . Obesity (BMI 30-39.9) 06/25/2017  . 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, [place], [place], [address] Phone: [phone]   Fax:  [phone]  Name: Erika Garrett MRN: [id number] Date of Birth: 1980/03/20

## 2020-05-24 ENCOUNTER — Other Ambulatory Visit: Payer: Self-pay

## 2020-05-24 ENCOUNTER — Encounter (INDEPENDENT_AMBULATORY_CARE_PROVIDER_SITE_OTHER): Payer: Self-pay | Admitting: Family Medicine

## 2020-05-24 ENCOUNTER — Encounter: Payer: Self-pay | Admitting: Physical Therapy

## 2020-05-24 ENCOUNTER — Ambulatory Visit: Payer: BC Managed Care – PPO | Attending: Family Medicine | Admitting: Physical Therapy

## 2020-05-24 ENCOUNTER — Ambulatory Visit (INDEPENDENT_AMBULATORY_CARE_PROVIDER_SITE_OTHER): Payer: BC Managed Care – PPO | Admitting: Family Medicine

## 2020-05-24 VITALS — BP 111/80 | HR 87 | Temp 98.0°F | Ht 67.0 in | Wt 191.0 lb

## 2020-05-24 DIAGNOSIS — E669 Obesity, unspecified: Secondary | ICD-10-CM | POA: Diagnosis not present

## 2020-05-24 DIAGNOSIS — Z6831 Body mass index (BMI) 31.0-31.9, adult: Secondary | ICD-10-CM | POA: Diagnosis not present

## 2020-05-24 DIAGNOSIS — M6281 Muscle weakness (generalized): Secondary | ICD-10-CM | POA: Diagnosis not present

## 2020-05-24 DIAGNOSIS — E559 Vitamin D deficiency, unspecified: Secondary | ICD-10-CM | POA: Diagnosis not present

## 2020-05-24 DIAGNOSIS — R279 Unspecified lack of coordination: Secondary | ICD-10-CM | POA: Diagnosis not present

## 2020-05-24 NOTE — Therapy (Signed)
[facility] [address], [place], [place], [address] Phone: [phone]   Fax:  [phone]  Physical Therapy Treatment  Patient Details  Name: Erika Garrett MRN: [id number] Date of Birth: 1980-07-10 Referring Provider (PT): Orma Flaming, MD   Encounter Date: 05/24/2020   PT End of Session - 05/24/20 1233    Visit Number 4    Date for PT Re-Evaluation 06/21/20    Authorization Type bcbs    PT Start Time 1150    PT Stop Time 1231    PT Time Calculation (min) 41 min    Activity Tolerance Patient tolerated treatment well    Behavior During Therapy [facility] for tasks assessed/performed           Past Medical History:  Diagnosis Date  . Allergy   . Anxiety   . Back pain   . Bilateral swelling of feet   . Constipation   . Depression   . Elevated liver enzymes   . Endometriosis   . Fibroids 09/17/2018  . Hyperlipidemia    hx of, no medications  . Hypertension 2016   Intercranial Hypertension  . Intracranial hypertension   . Joint pain   . Other fatigue   . 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 Shortness of breath on exertion   . Subclinical hypothyroidism   . Ventral hernia   . Vitamin D deficiency     Past Surgical History:  Procedure Laterality Date  . ABLATION ON ENDOMETRIOSIS    . ROBOTIC ASSISTED LAPAROSCOPIC PARTIAL CYSTECTOMY      There were no vitals filed for this visit.   Subjective Assessment - 05/24/20 1450    Subjective I don't feel as much pain but now is higher up in my back    Patient Stated Goals get back to walking, feel core is stronger, keep the scar tissue loose    Currently in Pain? No/denies                             [facility] Adult PT Treatment/Exercise - 05/24/20 0001      Lumbar Exercises: Stretches   Other Lumbar Stretch Exercise lumbar and thoracic rotation - 3 x 10sec; thoracic and lubmar ext at the wall - 5 x 10 sec      Lumbar Exercises:  Quadruped   Other Quadruped Lumbar Exercises thoracic rotation with one [facility]      Manual Therapy   Joint Mobilization thoracic ext mobs grade II-III    Soft tissue mobilization thoracic paraspinal - rectus abdominus                  PT Education - 05/24/20 1233    Education Details Access Code: [email or web address]    Person(s) Educated Patient    Methods Explanation;Demonstration;Tactile cues;Verbal cues;Handout    Comprehension Returned demonstration;Verbalized understanding               PT Long Term Goals - 05/17/20 1228      PT LONG TERM GOAL #1   Title Pt will be ind with advanced HEP    Status On-going      PT LONG TERM GOAL #2   Title Pt will report feeling back at least 75% back to normal strength in her core.    Status On-going                 Plan - 05/24/20 1406  Clinical Impression Statement Pt shows improved soft tissue pliability along Rt side.  pt did well with stretches and added to HEP in order to maintain improved mobility and soft tissue length.  Pt will benefit from skilled PT to continue working on mobility and core strength for posture improvements    Examination-Participation Restrictions Community Activity;Interpersonal Relationship    PT Treatment/Interventions ADLs/Self Care Home Management;Biofeedback;Cryotherapy;Software engineer;Therapeutic activities;Therapeutic exercise;Neuromuscular re-education;Patient/family education;Manual techniques;Dry needling;Passive range of motion;Taping    PT Next Visit Plan f/u on adding spinal mobility, kegel in standing flexion, f/u on pelvic floor muscle spasm and internal re-assess/STM if needed, progress core strength if tolerated    PT Home Exercise Plan Access Code: [email or web address]    Consulted and Agree with Plan of Care Patient           Patient will benefit from skilled therapeutic intervention in order to improve the following deficits and impairments:  Pain,Postural  dysfunction,Decreased strength,Impaired flexibility,Impaired tone,Increased fascial restricitons,Difficulty walking,Decreased range of motion,Decreased coordination,Increased muscle spasms  Visit Diagnosis: Muscle weakness (generalized)  Unspecified lack of coordination     Problem List Patient Active Problem List   Diagnosis Date Noted  . maternal ovarian cancer  11/20/2019  . Allergic contact dermatitis 08/23/2019  . Chronic rhinitis 08/13/2019  . Endometriosis of pelvis 06/25/2017  . Subclinical hypothyroidism 06/25/2017  . Obesity (BMI 30-39.9) 06/25/2017  . 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, [place], [place], [address] Phone: [phone]   Fax:  [phone]  Name: Erika Garrett MRN: [id number] Date of Birth: [birth year]

## 2020-05-24 NOTE — Patient Instructions (Signed)
Access Code: [email or web address] URL: [email or web address] Date: 05/24/2020 Prepared by: Jari Favre  Exercises Child's Pose with Sidebending - 1 x daily - 7 x weekly - 1 sets - 3 reps - 30 sec hold Thoracic Sidebending on Swiss Ball - 1 x daily - 7 x weekly - 3 sets - 10 reps Thoracic Cervical Extension on Swiss Ball - 1 x daily - 7 x weekly - 3 sets - 10 reps Seated Thoracic Flexion and Rotation with Swiss Ball - 1 x daily - 7 x weekly - 3 sets - 10 reps Standing Low Back Flexion at Table - 3 x daily - 7 x weekly - 1 sets - 8 reps Sidelying Thoracic and Shoulder Rotation - 1 x daily - 7 x weekly - 2 sets - 10 reps - 5 sec hold Supine Lower Trunk Rotation - 1 x daily - 7 x weekly - 10 reps - 1 sets - 5 sec hold Standing Lumbar Extension at Wall - Forearms - 1 x daily - 7 x weekly - 3 sets - 10 reps

## 2020-05-25 NOTE — Progress Notes (Signed)
Chief Complaint:   OBESITY Erika Garrett is here to discuss her progress with her obesity treatment plan along with follow-up of her obesity related diagnoses. Erika Garrett is on the Category 2 Plan and states she is following her eating plan approximately 100% of the time. Erika Garrett states she is doing 0 minutes 0 times per week.  Today's visit was #: 5 Starting weight: 200 lbs Starting date: 03/17/2020 Today's weight: 191 lbs Today's date: 05/24/2020 Total lbs lost to date: 9 Total lbs lost since last in-office visit: 0  Interim History: Erika Garrett is in the process of moving and things are hectic. She is still trying to follow her plan, but she may not be able to eat all of her food. She is moving to an apartment with a great gym.  Subjective:   1. Vitamin D deficiency Erika Garrett's Vit D level is not yet at goal on Vit D. She denies nausea, vomiting, or muscle weakness.  Assessment/Plan:   1. Vitamin D deficiency Low Vitamin D level contributes to fatigue and are associated with obesity, breast, and colon cancer. Erika Garrett agreed to continue taking OTC Vitamin D as is, and she will follow-up for routine testing of Vitamin D, at least 2-3 times per year to avoid over-replacement.  2. Obesity with current BMI 29.9 Erika Garrett is currently in the action stage of change. As such, her goal is to continue with weight loss efforts. She has agreed to the Category 2 Plan.   Exercise goals: Stretching exercises were discussed and demonstrated.  Behavioral modification strategies: increasing lean protein intake and no skipping meals.  Erika Garrett has agreed to follow-up with our clinic in 3 to 4 weeks. She was informed of the importance of frequent follow-up visits to maximize her success with intensive lifestyle modifications for her multiple health conditions.   Objective:   Blood pressure 111/80, pulse 87, temperature 98 F (36.7 C), height 5\' 7"  (1.702 m), weight 191 lb (86.6 kg), last menstrual period 11/27/2019, SpO2 98 %. Body  mass index is 29.91 kg/m.  General: Cooperative, alert, well developed, in no acute distress. HEENT: Conjunctivae and lids unremarkable. Cardiovascular: Regular rhythm.  Lungs: Normal work of breathing. Neurologic: No focal deficits.   Lab Results  Component Value Date   CREATININE 0.63 03/17/2020   BUN 17 03/17/2020   NA 139 03/17/2020   K 4.3 03/17/2020   CL 101 03/17/2020   CO2 18 (L) 03/17/2020   Lab Results  Component Value Date   ALT 57 (H) 03/17/2020   AST 42 (H) 03/17/2020   ALKPHOS 105 03/17/2020   BILITOT 0.4 03/17/2020   Lab Results  Component Value Date   HGBA1C 5.2 03/17/2020   Lab Results  Component Value Date   INSULIN 6.4 03/17/2020   Lab Results  Component Value Date   TSH 2.38 02/01/2020   Lab Results  Component Value Date   CHOL 233 (H) 03/17/2020   HDL 45 03/17/2020   LDLCALC 144 (H) 03/17/2020   LDLDIRECT 135.0 09/17/2018   TRIG 242 (H) 03/17/2020   CHOLHDL 5.0 (H) 11/20/2019   Lab Results  Component Value Date   WBC 9.7 02/01/2020   HGB 14.7 02/01/2020   HCT 43.0 02/01/2020   MCV 90.3 02/01/2020   PLT 258.0 02/01/2020   No results found for: IRON, TIBC, FERRITIN  Attestation Statements:   Reviewed by clinician on day of visit: allergies, medications, problem list, medical history, surgical history, family history, social history, and previous encounter notes.  Time  spent on visit including pre-visit chart review and post-visit care and charting was 30 minutes.    I, Trixie Dredge, am acting as transcriptionist for Dennard Nip, MD.  I have reviewed the above documentation for accuracy and completeness, and I agree with the above. -  Dennard Nip, MD

## 2020-05-31 ENCOUNTER — Ambulatory Visit: Payer: BC Managed Care – PPO | Admitting: Physical Therapy

## 2020-05-31 ENCOUNTER — Other Ambulatory Visit: Payer: Self-pay

## 2020-05-31 DIAGNOSIS — M6281 Muscle weakness (generalized): Secondary | ICD-10-CM | POA: Diagnosis not present

## 2020-05-31 DIAGNOSIS — R279 Unspecified lack of coordination: Secondary | ICD-10-CM | POA: Diagnosis not present

## 2020-05-31 NOTE — Therapy (Signed)
[facility] [address] [place], [place], [address] Phone: [phone]   Fax:  [phone]  Physical Therapy Treatment  Patient Details  Name: Erika Garrett MRN: [id number] Date of Birth: 06/13/1980 Referring Provider (PT): Orma Flaming, MD   Encounter Date: 05/31/2020   PT End of Session - 05/31/20 1228    Visit Number 5    Date for PT Re-Evaluation 06/21/20    Authorization Type bcbs    PT Start Time 1148    PT Stop Time 1229    PT Time Calculation (min) 41 min    Activity Tolerance Patient tolerated treatment well    Behavior During Therapy [facility] for tasks assessed/performed           Past Medical History:  Diagnosis Date  . Allergy   . Anxiety   . Back pain   . Bilateral swelling of feet   . Constipation   . Depression   . Elevated liver enzymes   . Endometriosis   . Fibroids 09/17/2018  . Hyperlipidemia    hx of, no medications  . Hypertension 2016   Intercranial Hypertension  . Intracranial hypertension   . Joint pain   . Other fatigue   . 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 Shortness of breath on exertion   . Subclinical hypothyroidism   . Ventral hernia   . Vitamin D deficiency     Past Surgical History:  Procedure Laterality Date  . ABLATION ON ENDOMETRIOSIS    . ROBOTIC ASSISTED LAPAROSCOPIC PARTIAL CYSTECTOMY      There were no vitals filed for this visit.   Subjective Assessment - 05/31/20 1229    Subjective I feel much looser in my side, just feels like I am weak there    Patient Stated Goals get back to walking, feel core is stronger, keep the scar tissue loose    Currently in Pain? No/denies                             [facility] Adult PT Treatment/Exercise - 05/31/20 0001      Lumbar Exercises: Standing   Other Standing Lumbar Exercises doorway  pec stretch      Lumbar Exercises: Supine   Ab Set 10 reps    AB Set Limitations transversus     Bent Knee Raise 20 reps    Dead Bug Limitations UE press on one LE at a time    Other Supine Lumbar Exercises nent knee drop out, 20      Manual Therapy   Soft tissue mobilization scalenes, upper trap, pec minor Rt>Lt                  PT Education - 05/31/20 1224    Education Details Access Code: [email or web address]    Person(s) Educated Patient    Methods Explanation;Demonstration;Handout;Verbal cues    Comprehension Returned demonstration;Verbalized understanding               PT Long Term Goals - 05/31/20 1157      PT LONG TERM GOAL #1   Title Pt will be ind with advanced HEP    Status On-going      PT LONG TERM GOAL #2   Title Pt will report feeling back at least 75% back to normal strength in her core.      PT LONG TERM GOAL #3   Title Pt will be 5/5 strength  in bilateral hips for improved gait mechanics for return to walking program    Baseline not walking yet because of nutrition wanting her to rest    Status On-going      PT LONG TERM GOAL #4   Title pt will report only 1/3 Marinoff scale at most    Baseline 1/3      PT LONG TERM GOAL #5   Title Pt will be able to laugh for as long as she wants without feeling like she will have leakage    Status Achieved                 Plan - 05/31/20 1230    Clinical Impression Statement Pt was severely tight and restricted in the cervical and upper thoracic region.  She does not like needling so PT did STM techniques.  Pt was TTP and tight Rt pec minor.  pt tolerated well and reports feeling looser after treatment.  Pt was educated on how to turn on transverse abdominus.  She was able to do correctly and exercises added to HEP in order to advance towards functional goals.    PT Treatment/Interventions ADLs/Self Care Home Management;Biofeedback;Cryotherapy;Software engineer;Therapeutic activities;Therapeutic exercise;Neuromuscular re-education;Patient/family education;Manual techniques;Dry  needling;Passive range of motion;Taping    PT Next Visit Plan thoracic mob; pec minor; transversus ab ex's    PT Home Exercise Plan Access Code: [email or web address]    Consulted and Agree with Plan of Care Patient           Patient will benefit from skilled therapeutic intervention in order to improve the following deficits and impairments:  Pain,Postural dysfunction,Decreased strength,Impaired flexibility,Impaired tone,Increased fascial restricitons,Difficulty walking,Decreased range of motion,Decreased coordination,Increased muscle spasms  Visit Diagnosis: Muscle weakness (generalized)  Unspecified lack of coordination     Problem List Patient Active Problem List   Diagnosis Date Noted  . maternal ovarian cancer  11/20/2019  . Allergic contact dermatitis 08/23/2019  . Chronic rhinitis 08/13/2019  . Endometriosis of pelvis 06/25/2017  . Subclinical hypothyroidism 06/25/2017  . Obesity (BMI 30-39.9) 06/25/2017  . 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 [place], [place], [address] Phone: [phone]   Fax:  [phone]  Name: Nakeita Styles MRN: [id number] Date of Birth: 02-Jun-1980

## 2020-05-31 NOTE — Patient Instructions (Signed)
Access Code: [email or web address] URL: [email or web address] Date: 05/31/2020 Prepared by: Jari Favre  Exercises Child's Pose with Sidebending - 1 x daily - 7 x weekly - 1 sets - 3 reps - 30 sec hold Thoracic Sidebending on Swiss Ball - 1 x daily - 7 x weekly - 3 sets - 10 reps Thoracic Cervical Extension on Swiss Ball - 1 x daily - 7 x weekly - 3 sets - 10 reps Seated Thoracic Flexion and Rotation with Swiss Ball - 1 x daily - 7 x weekly - 3 sets - 10 reps Standing Low Back Flexion at Table - 3 x daily - 7 x weekly - 1 sets - 8 reps Sidelying Thoracic and Shoulder Rotation - 1 x daily - 7 x weekly - 2 sets - 10 reps - 5 sec hold Supine Lower Trunk Rotation - 1 x daily - 7 x weekly - 10 reps - 1 sets - 5 sec hold Standing Lumbar Extension at Wall - Forearms - 1 x daily - 7 x weekly - 3 sets - 10 reps Bent Knee Fallouts - 1 x daily - 7 x weekly - 3 sets - 10 reps Hooklying Small March - 1 x daily - 7 x weekly - 10 reps - 2 sets

## 2020-06-09 ENCOUNTER — Encounter: Payer: Self-pay | Admitting: Physical Therapy

## 2020-06-09 ENCOUNTER — Ambulatory Visit: Payer: BC Managed Care – PPO | Admitting: Physical Therapy

## 2020-06-09 ENCOUNTER — Other Ambulatory Visit: Payer: Self-pay

## 2020-06-09 DIAGNOSIS — R279 Unspecified lack of coordination: Secondary | ICD-10-CM | POA: Diagnosis not present

## 2020-06-09 DIAGNOSIS — M6281 Muscle weakness (generalized): Secondary | ICD-10-CM

## 2020-06-09 NOTE — Therapy (Signed)
[facility] [address] [place], [place], [address] Phone: [phone]   Fax:  [phone]  Physical Therapy Treatment  Patient Details  Name: Erika Garrett MRN: [id number] Date of Birth: November 21, 1980 Referring Provider (PT): Orma Flaming, MD   Encounter Date: 06/09/2020   PT End of Session - 06/09/20 1207    Visit Number 6    Date for PT Re-Evaluation 06/21/20    Authorization Type bcbs    PT Start Time 1149    PT Stop Time 1229    PT Time Calculation (min) 40 min    Activity Tolerance Patient tolerated treatment well    Behavior During Therapy [place] for tasks assessed/performed           Past Medical History:  Diagnosis Date  . Allergy   . Anxiety   . Back pain   . Bilateral swelling of feet   . Constipation   . Depression   . Elevated liver enzymes   . Endometriosis   . Fibroids 09/17/2018  . Hyperlipidemia    hx of, no medications  . Hypertension 2016   Intercranial Hypertension  . Intracranial hypertension   . Joint pain   . Other fatigue   . 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 Shortness of breath on exertion   . Subclinical hypothyroidism   . Ventral hernia   . Vitamin D deficiency     Past Surgical History:  Procedure Laterality Date  . ABLATION ON ENDOMETRIOSIS    . ROBOTIC ASSISTED LAPAROSCOPIC PARTIAL CYSTECTOMY      There were no vitals filed for this visit.   Subjective Assessment - 06/09/20 1205    Subjective Pt has been moving to new apartment and feels like low back and legs are tight from lots of extra activities with the move    Patient Stated Goals get back to walking, feel core is stronger, keep the scar tissue loose    Currently in Pain? No/denies                             [facility] Adult PT Treatment/Exercise - 06/10/20 0001      Lumbar Exercises: Stretches   Other Lumbar Stretch Exercise towel stretch for pecs    Other Lumbar Stretch  Exercise foam roll lumbar flexion - SKTC, DKTC, rocking      Lumbar Exercises: Standing   Other Standing Lumbar Exercises doorway  pec stretch      Manual Therapy   Manual Therapy Soft tissue mobilization    Soft tissue mobilization scalenes, upper trap, suboccip, cervical spine, Rt side >Lt, masseter and temporalis                  PT Education - 06/09/20 1234    Education Details Access Code: [email or web address]    Person(s) Educated Patient    Methods Explanation;Demonstration;Tactile cues;Verbal cues;Handout    Comprehension Verbalized understanding;Returned demonstration               PT Long Term Goals - 06/10/20 0829      PT LONG TERM GOAL #1   Title Pt will be ind with advanced HEP    Status On-going      PT LONG TERM GOAL #2   Title Pt will report feeling back at least 75% back to normal strength in her core.    Baseline lifting causes some increased pain but better    Status  On-going      PT LONG TERM GOAL #3   Title Pt will be 5/5 strength in bilateral hips for improved gait mechanics for return to walking program    Status On-going      PT LONG TERM GOAL #4   Title pt will report only 1/3 Marinoff scale at most    Baseline 1/3    Status Achieved                 Plan - 06/10/20 0756    Clinical Impression Statement Pt has made progress with focus on thoracic mobility.  She responded well to upper thoracic/cervical soft tissue mobility today.  Pt will benefit from skilled PT to continue working out posture restoration and increased resistance with funcitonal activities so she can achieve all functional goals    PT Treatment/Interventions ADLs/Self Care Home Management;Biofeedback;Cryotherapy;Software engineer;Therapeutic activities;Therapeutic exercise;Neuromuscular re-education;Patient/family education;Manual techniques;Dry needling;Passive range of motion;Taping    PT Next Visit Plan cervical and thoracic posutre and  mobility; core strength with funcitonal lifting; hip hinge    PT Home Exercise Plan Access Code: [email or web address]    Consulted and Agree with Plan of Care Patient           Patient will benefit from skilled therapeutic intervention in order to improve the following deficits and impairments:  Pain,Postural dysfunction,Decreased strength,Impaired flexibility,Impaired tone,Increased fascial restricitons,Difficulty walking,Decreased range of motion,Decreased coordination,Increased muscle spasms  Visit Diagnosis: Muscle weakness (generalized)  Unspecified lack of coordination     Problem List Patient Active Problem List   Diagnosis Date Noted  . maternal ovarian cancer  11/20/2019  . Allergic contact dermatitis 08/23/2019  . Chronic rhinitis 08/13/2019  . Endometriosis of pelvis 06/25/2017  . Subclinical hypothyroidism 06/25/2017  . Obesity (BMI 30-39.9) 06/25/2017  . 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, [place], [place], [address] Phone: [phone]   Fax:  [phone]  Name: Erika Garrett MRN: [id number] Date of Birth: [birth year]

## 2020-06-09 NOTE — Patient Instructions (Signed)
Access Code: [email or web address] URL: [email or web address] Date: 06/09/2020 Prepared by: Jari Favre  Exercises Seated Correct Posture - 1 x daily - 7 x weekly - 1 sets - 1 reps Seated Shoulder Horizontal Abduction with Resistance - 1 x daily - 7 x weekly - 3 sets - 10 reps Seated Bilateral Shoulder External Rotation with Resistance - 1 x daily - 7 x weekly - 3 sets - 10 reps Standing Hamstring Stretch with Step - 1 x daily - 7 x weekly - 1 sets - 3 reps - 30 sec hold Standing Gastroc Stretch - 1 x daily - 7 x weekly - 1 sets - 3 reps - 30 sec hold Static Prone on Elbows - 1 x daily - 7 x weekly - 1 sets - 3 reps - 30 hold Supine Figure 4 Piriformis Stretch - 1 x daily - 7 x weekly - 1 sets - 3 reps - 30 sec hold Open Book Chest Rotation Stretch on Foam 1/2 Roll - 1 x daily - 7 x weekly - 1 sets - 5 reps - 20 hold Thoracic Extension Mobilization with Noodle - 1 x daily - 7 x weekly - 1 sets - 10 reps - 5 sec hold Sidelying Thoracic Rotation with Open Book - 1 x daily - 7 x weekly - 5 reps - 1 sets - 10 sec hold Supine Piriformis Stretch with Leg Straight - 1 x daily - 7 x weekly - 3 reps - 1 sets - 30 sec hold Supine Lower Trunk Rotation - 1 x daily - 7 x weekly - 10 reps - 1 sets - 5 sec hold Shoulder extension with resistance - Neutral - 1 x daily - 7 x weekly - 3 sets - 10 reps Half Kneeling Hip Flexor Stretch - 1 x daily - 7 x weekly - 3 sets - 10 reps Half Kneeling Diagonal Lift with Narrow Balance - 1 x daily - 7 x weekly - 3 sets - 10 reps Table Lean - 3 x daily - 7 x weekly - 1 sets - 10 reps Seated Sidebending Arms Overhead - 1 x daily - 7 x weekly - 3 sets - 10 reps TL Sidebending Stretch - Arms Overhead - 1 x daily - 7 x weekly - 3 sets - 10 reps Seated Pelvic Clock on Balance Disk - 1 x daily - 7 x weekly - 2 sets - 10 reps Supine Hip Adductor Squeeze with Small Ball - 3 x daily - 7 x weekly - 1 sets - 10 reps - 3 sec hold Seated Shoulder Flexion with Dumbbells - 1  x daily - 7 x weekly - 3 sets - 10 reps Doorway Pec Stretch at 60 Elevation - 1 x daily - 7 x weekly - 3 sets - 10 reps Standing Hip Abduction with Counter Support - 1 x daily - 7 x weekly - 3 sets - 10 reps

## 2020-06-14 ENCOUNTER — Other Ambulatory Visit: Payer: Self-pay

## 2020-06-14 ENCOUNTER — Ambulatory Visit: Payer: BC Managed Care – PPO | Admitting: Physical Therapy

## 2020-06-14 ENCOUNTER — Encounter: Payer: Self-pay | Admitting: Physical Therapy

## 2020-06-14 DIAGNOSIS — R279 Unspecified lack of coordination: Secondary | ICD-10-CM | POA: Diagnosis not present

## 2020-06-14 DIAGNOSIS — M6281 Muscle weakness (generalized): Secondary | ICD-10-CM

## 2020-06-14 NOTE — Therapy (Signed)
[facility] [address] [place], [place], [address] Phone: [phone]   Fax:  [phone]  Physical Therapy Treatment  Patient Details  Name: Erika Garrett MRN: [id number] Date of Birth: 12-Aug-1980 Referring Provider (PT): Orma Flaming, MD   Encounter Date: 06/14/2020   PT End of Session - 06/14/20 1139    Visit Number 7    Date for PT Re-Evaluation 08/09/20    Authorization Type bcbs    PT Start Time 1137    PT Stop Time 1226    PT Time Calculation (min) 49 min    Activity Tolerance Patient tolerated treatment well    Behavior During Therapy [facility] for tasks assessed/performed           Past Medical History:  Diagnosis Date  . Allergy   . Anxiety   . Back pain   . Bilateral swelling of feet   . Constipation   . Depression   . Elevated liver enzymes   . Endometriosis   . Fibroids 09/17/2018  . Hyperlipidemia    hx of, no medications  . Hypertension 2016   Intercranial Hypertension  . Intracranial hypertension   . Joint pain   . Other fatigue   . 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 Shortness of breath on exertion   . Subclinical hypothyroidism   . Ventral hernia   . Vitamin D deficiency     Past Surgical History:  Procedure Laterality Date  . ABLATION ON ENDOMETRIOSIS    . ROBOTIC ASSISTED LAPAROSCOPIC PARTIAL CYSTECTOMY      There were no vitals filed for this visit.   Subjective Assessment - 06/14/20 1226    Subjective I am tight in my hamstring and shoulder/upper back never seems to relax I am now noticing that more.  Pt states core still feels like cannot get the muscles working when lifting    Patient Stated Goals get back to walking, feel core is stronger, keep the scar tissue loose    Currently in Pain? No/denies              [facility] PT Assessment - 06/14/20 0001      Assessment   Medical Diagnosis N81.89 (ICD-10-CM) - Pelvic floor weakness in female    Referring  Provider (PT) Orma Flaming, MD      Strength   Overall Strength Comments Lt hip abduction/adduction 4+/5 and Rt 5/5 - improved from 4/5 at evaluation                         [facility] Adult PT Treatment/Exercise - 06/14/20 0001      Lumbar Exercises: Stretches   Other Lumbar Stretch Exercise cervical rotation and scalene stretch with the towel      Lumbar Exercises: Supine   Clam 20 reps    Clam Limitations green loop    Bent Knee Raise 20 reps    Bent Knee Raise Limitations green loop    Other Supine Lumbar Exercises bent knee drop out, 20      Manual Therapy   Soft tissue mobilization scalenes, upper trap, suboccip, cervical spine-- Rt side >Lt,                  PT Education - 06/14/20 1231    Education Details Access Code: [email or web address]    Person(s) Educated Patient    Methods Explanation;Demonstration;Tactile cues;Verbal cues;Handout    Comprehension Verbalized understanding;Returned demonstration  PT Long Term Goals - 06/14/20 1144      PT LONG TERM GOAL #1   Title Pt will be ind with advanced HEP    Time 8    Status On-going    Target Date 08/09/20      PT LONG TERM GOAL #2   Title Pt will report feeling back at least 75% back to normal strength in her core.    Baseline 60-70% better; moving feel like I overcompensate with h/s and upper    Time 8    Period Weeks    Status On-going      PT LONG TERM GOAL #3   Title Pt will be 5/5 strength in bilateral hips for improved gait mechanics for return to walking program    Baseline not walking yet because of nutrition wanting her to rest; Lt hip abduction and adduction 4+/5    Time 8    Status On-going      PT LONG TERM GOAL #4   Title pt will report only 1/3 Marinoff scale at most    Status Achieved      PT LONG TERM GOAL #5   Title Pt will be able to laugh for as long as she wants without feeling like she will have leakage    Time 8    Period Weeks    Status Achieved                  Plan - 06/14/20 1227    Clinical Impression Statement Pt was re-assessed and goals updated today. Due to posture and compensatory muscle activation, patient is slow to regain strength in her core.  Pt is making progress and has no leakage and able to have pain free or close to pain free intercourse.  Pt will benefit from skilled PT to continue to progress towards functional goals with return to walking and exercise routine.  PT is recommending 4 more visits that are spaced out to allow for pt time to build strength and demonstrate good progression of exercise.    PT Frequency 1x / week   going to biweekly   PT Duration 8 weeks    PT Treatment/Interventions ADLs/Self Care Home Management;Biofeedback;Cryotherapy;Software engineer;Therapeutic activities;Therapeutic exercise;Neuromuscular re-education;Patient/family education;Manual techniques;Dry needling;Passive range of motion;Taping    PT Next Visit Plan progression core strength    PT Home Exercise Plan Access Code: [email or web address]    Consulted and Agree with Plan of Care Patient           Patient will benefit from skilled therapeutic intervention in order to improve the following deficits and impairments:  Pain,Postural dysfunction,Decreased strength,Impaired flexibility,Impaired tone,Increased fascial restricitons,Difficulty walking,Decreased range of motion,Decreased coordination,Increased muscle spasms  Visit Diagnosis: Muscle weakness (generalized)  Unspecified lack of coordination     Problem List Patient Active Problem List   Diagnosis Date Noted  . maternal ovarian cancer  11/20/2019  . Allergic contact dermatitis 08/23/2019  . Chronic rhinitis 08/13/2019  . Endometriosis of pelvis 06/25/2017  . Subclinical hypothyroidism 06/25/2017  . Obesity (BMI 30-39.9) 06/25/2017  . Pseudotumor cerebri induced by OCPs 05/22/2017    Jule Ser, PT 06/14/2020, 1:11 PM  [facility] [address], [place], [place], [address] Phone: [phone]   Fax:  [phone]  Name: Erika Garrett MRN: [id number] Date of Birth: [birth year]

## 2020-06-14 NOTE — Patient Instructions (Signed)
Access Code: [email or web address] URL: [email or web address] Date: 06/14/2020 Prepared by: Jari Favre  Exercises Child's Pose with Sidebending - 1 x daily - 7 x weekly - 1 sets - 3 reps - 30 sec hold Thoracic Sidebending on Swiss Ball - 1 x daily - 7 x weekly - 3 sets - 10 reps Thoracic Cervical Extension on Swiss Ball - 1 x daily - 7 x weekly - 3 sets - 10 reps Seated Thoracic Flexion and Rotation with Swiss Ball - 1 x daily - 7 x weekly - 3 sets - 10 reps Standing Low Back Flexion at Table - 3 x daily - 7 x weekly - 1 sets - 8 reps Sidelying Thoracic and Shoulder Rotation - 1 x daily - 7 x weekly - 2 sets - 10 reps - 5 sec hold Supine Lower Trunk Rotation - 1 x daily - 7 x weekly - 10 reps - 1 sets - 5 sec hold Standing Lumbar Extension at Wall - Forearms - 1 x daily - 7 x weekly - 3 sets - 10 reps Doorway Pec Stretch at 90 Degrees Abduction - 5 x daily - 7 x weekly - 1 sets - 3 reps - 30 hold Hooklying Small March - 1 x daily - 7 x weekly - 10 reps - 2 sets Bent Knee Fallouts - 1 x daily - 7 x weekly - 3 sets - 10 reps

## 2020-06-15 ENCOUNTER — Ambulatory Visit (INDEPENDENT_AMBULATORY_CARE_PROVIDER_SITE_OTHER): Payer: BC Managed Care – PPO | Admitting: Physician Assistant

## 2020-06-15 ENCOUNTER — Encounter (INDEPENDENT_AMBULATORY_CARE_PROVIDER_SITE_OTHER): Payer: Self-pay | Admitting: Physician Assistant

## 2020-06-15 VITALS — BP 111/80 | HR 83 | Temp 98.4°F | Ht 67.0 in | Wt 193.0 lb

## 2020-06-15 DIAGNOSIS — E669 Obesity, unspecified: Secondary | ICD-10-CM | POA: Diagnosis not present

## 2020-06-15 DIAGNOSIS — E7849 Other hyperlipidemia: Secondary | ICD-10-CM | POA: Diagnosis not present

## 2020-06-15 DIAGNOSIS — Z6830 Body mass index (BMI) 30.0-30.9, adult: Secondary | ICD-10-CM

## 2020-06-15 NOTE — Progress Notes (Signed)
Chief Complaint:   OBESITY Erika Garrett is here to discuss her progress with her obesity treatment plan along with follow-up of her obesity related diagnoses. Erika Garrett is on the Category 2 Plan and states she is following her eating plan approximately 90% of the time. Erika Garrett states she is doing 0 minutes 0 times per week.  Today's visit was #: 6 Starting weight: 200 lbs Starting date: 03/17/2020 Today's weight: 193 lbs Today's date: 06/15/2020 Total lbs lost to date: 7 Total lbs lost since last in-office visit: 0  Interim History: Erika Garrett was doing well until she moved and they ate out for 3-4 days in a row. She is now moved in and her kitchen is set up and she is back on the plan. She is going to [place] on June 22nd and she wants to discuss travel strategies.  Subjective:   1. Other hyperlipidemia Erika Garrett is not on medications. Last lipid panel was not at goal.  Assessment/Plan:   1. Other hyperlipidemia Cardiovascular risk and specific lipid/LDL goals reviewed. We discussed several lifestyle modifications today. Erika Garrett will continue her meal plan, and will start exercising. Orders and follow up as documented in patient record.   Counseling Intensive lifestyle modifications are the first line treatment for this issue. . Dietary changes: Increase soluble fiber. Decrease simple carbohydrates. . Exercise changes: Moderate to vigorous-intensity aerobic activity 150 minutes per week if tolerated. . Lipid-lowering medications: see documented in medical record.  2. Class 1 obesity with serious comorbidity and body mass index (BMI) of 30.0 to 30.9 in adult, unspecified obesity type Erika Garrett is currently in the action stage of change. As such, her goal is to continue with weight loss efforts. She has agreed to the Category 2 Plan.   Exercise goals: Add exercise in for 20-30 minutes 5 days per week.  Behavioral modification strategies: decreasing eating out, meal planning and cooking strategies and travel  eating strategies.  Erika Garrett has agreed to follow-up with our clinic in 3 weeks. She was informed of the importance of frequent follow-up visits to maximize her success with intensive lifestyle modifications for her multiple health conditions.   Objective:   Blood pressure 111/80, pulse 83, temperature 98.4 F (36.9 C), height 5\' 7"  (1.702 m), weight 193 lb (87.5 kg), last menstrual period 11/27/2019, SpO2 100 %. Body mass index is 30.23 kg/m.  General: Cooperative, alert, well developed, in no acute distress. HEENT: Conjunctivae and lids unremarkable. Cardiovascular: Regular rhythm.  Lungs: Normal work of breathing. Neurologic: No focal deficits.   Lab Results  Component Value Date   CREATININE 0.63 03/17/2020   BUN 17 03/17/2020   NA 139 03/17/2020   K 4.3 03/17/2020   CL 101 03/17/2020   CO2 18 (L) 03/17/2020   Lab Results  Component Value Date   ALT 57 (H) 03/17/2020   AST 42 (H) 03/17/2020   ALKPHOS 105 03/17/2020   BILITOT 0.4 03/17/2020   Lab Results  Component Value Date   HGBA1C 5.2 03/17/2020   Lab Results  Component Value Date   INSULIN 6.4 03/17/2020   Lab Results  Component Value Date   TSH 2.38 02/01/2020   Lab Results  Component Value Date   CHOL 233 (H) 03/17/2020   HDL 45 03/17/2020   LDLCALC 144 (H) 03/17/2020   LDLDIRECT 135.0 09/17/2018   TRIG 242 (H) 03/17/2020   CHOLHDL 5.0 (H) 11/20/2019   Lab Results  Component Value Date   WBC 9.7 02/01/2020   HGB 14.7 02/01/2020  HCT 43.0 02/01/2020   MCV 90.3 02/01/2020   PLT 258.0 02/01/2020   No results found for: IRON, TIBC, FERRITIN  Attestation Statements:   Reviewed by clinician on day of visit: allergies, medications, problem list, medical history, surgical history, family history, social history, and previous encounter notes.  Time spent on visit including pre-visit chart review and post-visit care and charting was 31 minutes.    Wilhemena Durie, am acting as transcriptionist for  [employer], PA-C.  I have reviewed the above documentation for accuracy and completeness, and I agree with the above. -  *Abby Potash, PA-C

## 2020-06-28 ENCOUNTER — Other Ambulatory Visit: Payer: Self-pay

## 2020-06-28 ENCOUNTER — Ambulatory Visit: Payer: BC Managed Care – PPO | Attending: Family Medicine | Admitting: Physical Therapy

## 2020-06-28 DIAGNOSIS — R279 Unspecified lack of coordination: Secondary | ICD-10-CM | POA: Insufficient documentation

## 2020-06-28 DIAGNOSIS — M6281 Muscle weakness (generalized): Secondary | ICD-10-CM | POA: Diagnosis not present

## 2020-06-28 NOTE — Therapy (Signed)
[facility] [address] [place], [place], [address] Phone: [phone]   Fax:  [phone]  Physical Therapy Treatment  Patient Details  Name: Erika Garrett MRN: [id number] Date of Birth: 1980-08-29 Referring Provider (PT): Orma Flaming, MD   Encounter Date: 06/28/2020   PT End of Session - 06/28/20 1615    Visit Number 8    Date for PT Re-Evaluation 08/09/20    Authorization Type bcbs    PT Start Time 1145    PT Stop Time 1227    PT Time Calculation (min) 42 min           Past Medical History:  Diagnosis Date  . Allergy   . Anxiety   . Back pain   . Bilateral swelling of feet   . Constipation   . Depression   . Elevated liver enzymes   . Endometriosis   . Fibroids 09/17/2018  . Hyperlipidemia    hx of, no medications  . Hypertension 2016   Intercranial Hypertension  . Intracranial hypertension   . Joint pain   . Other fatigue   . 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 Shortness of breath on exertion   . Subclinical hypothyroidism   . Ventral hernia   . Vitamin D deficiency     Past Surgical History:  Procedure Laterality Date  . ABLATION ON ENDOMETRIOSIS    . ROBOTIC ASSISTED LAPAROSCOPIC PARTIAL CYSTECTOMY      There were no vitals filed for this visit.   Subjective Assessment - 06/28/20 1603    Subjective Pt states she is still feeling most of the tension in Rt upper back and shoulder.  Pt has been doing easy exercises and stretches                             [facility] Adult PT Treatment/Exercise - 06/28/20 0001      Lumbar Exercises: Standing   Shoulder Extension Strengthening;Both;20 reps;Theraband    Theraband Level (Shoulder Extension) Level 3 (Green)      Lumbar Exercises: Seated   Other Seated Lumbar Exercises horizontal abduction and diagonals - red - 30x      Lumbar Exercises: Quadruped   Single Arm Raise Right;Left;10 reps      Manual Therapy    Soft tissue mobilization scalenes, upper trap, suboccip, cervical spine-- Rt side >Lt,                  PT Education - 06/28/20 1625    Education Details Access Code: [email or web address]    Person(s) Educated Patient    Methods Explanation;Demonstration;Verbal cues;Handout    Comprehension Verbalized understanding;Returned demonstration               PT Long Term Goals - 06/28/20 1615      PT LONG TERM GOAL #1   Title Pt will be ind with advanced HEP    Baseline progressed today    Status On-going                 Plan - 06/28/20 1616    Clinical Impression Statement Today's treatment focused on progression core strength and incorporating UE to improve thoracic and shoulder strength. Pt has tension in pecs and shoulder weakness needing cues for improved posture.  She continues to benefit from skilled PTto address posture and soft tissue length.    PT Treatment/Interventions ADLs/Self Care Home Management;Biofeedback;Cryotherapy;Software engineer;Therapeutic activities;Therapeutic exercise;Neuromuscular  re-education;Patient/family education;Manual techniques;Dry needling;Passive range of motion;Taping    PT Next Visit Plan progression core strength    PT Home Exercise Plan Access Code: [email or web address]    Consulted and Agree with Plan of Care Patient           Patient will benefit from skilled therapeutic intervention in order to improve the following deficits and impairments:  Pain,Postural dysfunction,Decreased strength,Impaired flexibility,Impaired tone,Increased fascial restricitons,Difficulty walking,Decreased range of motion,Decreased coordination,Increased muscle spasms  Visit Diagnosis: Muscle weakness (generalized)  Unspecified lack of coordination     Problem List Patient Active Problem List   Diagnosis Date Noted  . maternal ovarian cancer  11/20/2019  . Allergic contact dermatitis 08/23/2019  . Chronic rhinitis 08/13/2019  .  Endometriosis of pelvis 06/25/2017  . Subclinical hypothyroidism 06/25/2017  . Obesity (BMI 30-39.9) 06/25/2017  . Pseudotumor cerebri induced by OCPs 05/22/2017    Jule Ser, PT 06/28/2020, 4:25 PM  [facility] [address], [place], [place], [address] Phone: [phone]   Fax:  [phone]  Name: Avalynne Diver MRN: [id number] Date of Birth: [birth year]

## 2020-07-05 ENCOUNTER — Encounter: Payer: BC Managed Care – PPO | Admitting: Physical Therapy

## 2020-07-11 ENCOUNTER — Ambulatory Visit: Payer: BC Managed Care – PPO | Admitting: Physical Therapy

## 2020-07-12 ENCOUNTER — Ambulatory Visit (INDEPENDENT_AMBULATORY_CARE_PROVIDER_SITE_OTHER): Payer: BC Managed Care – PPO | Admitting: Family Medicine

## 2020-07-18 ENCOUNTER — Encounter: Payer: Self-pay | Admitting: Family Medicine

## 2020-07-18 ENCOUNTER — Ambulatory Visit (INDEPENDENT_AMBULATORY_CARE_PROVIDER_SITE_OTHER): Payer: BC Managed Care – PPO | Admitting: Family Medicine

## 2020-07-18 NOTE — Telephone Encounter (Signed)
Appointment scheduled with [facility]

## 2020-07-19 ENCOUNTER — Telehealth (INDEPENDENT_AMBULATORY_CARE_PROVIDER_SITE_OTHER): Payer: BC Managed Care – PPO | Admitting: Family Medicine

## 2020-07-19 ENCOUNTER — Telehealth: Payer: BC Managed Care – PPO | Admitting: Physician Assistant

## 2020-07-19 DIAGNOSIS — U071 COVID-19: Secondary | ICD-10-CM | POA: Diagnosis not present

## 2020-07-19 MED ORDER — MOLNUPIRAVIR EUA 200MG CAPSULE: 4.0000 | ORAL_CAPSULE | Freq: Two times a day (BID) | ORAL | 0 refills | Status: AC

## 2020-07-19 NOTE — Patient Instructions (Addendum)
HOME CARE TIPS:    -I sent the medication(s) we discussed to your pharmacy: Meds ordered this encounter  Medications   molnupiravir EUA 200 mg CAPS    Sig: Take 4 capsules (800 mg total) by mouth 2 (two) times daily for 5 days.    Dispense:  40 capsule    Refill:  0     -I sent in the Covid19 treatment or referral you requested per our discussion. Please see the information provided below and discuss further with the pharmacist/treatment team.   -can use tylenol or aleve if needed for fevers, aches and pains per instructions  -can use nasal saline a few times per day if you have nasal congestion; sometimes  a short course of Afrin nasal spray for 3 days can help with symptoms as well  -stay hydrated, drink plenty of fluids and eat small healthy meals - avoid dairy  -can take 1000 IU (25mcg) Vit D3 and 100-500 mg of Vit C daily per instructions  -If the Covid test is positive, check out the CDC website for more information on home care, transmission and treatment for COVID19  -follow up with your doctor in 2-3 days unless improving and feeling better  -stay home while sick, except to seek medical care. If you have COVID19, ideally it would be best to stay home for a full 10 days since the onset of symptoms PLUS one day of no fever and feeling better. Wear a good mask that fits snugly (such as N95 or KN95) if around others to reduce the risk of transmission.  It was nice to meet you today, and I really hope you are feeling better soon. I help [facility] out with telemedicine visits on Tuesdays and Thursdays and am available for visits on those days. If you have any concerns or questions following this visit please schedule a follow up visit with your Primary Care doctor or seek care at a local urgent care clinic to avoid delays in care.    Seek in person care or schedule a follow up video visit promptly if your symptoms worsen, new concerns arise or you are not improving with  treatment. Call 911 and/or seek emergency care if your symptoms are severe or life threatening.    Fact Sheet for Patients And Caregivers Emergency Use Authorization (EUA) Of LAGEVRIOT (molnupiravir) capsules For Coronavirus Disease 2019 (COVID-19)  What is the most important information I should know about LAGEVRIO? LAGEVRIO may cause serious side effects, including: ? LAGEVRIO may cause harm to your unborn baby. It is not known if LAGEVRIO will harm your baby if you take LAGEVRIO during pregnancy. o LAGEVRIO is not recommended for use in pregnancy. o LAGEVRIO has not been studied in pregnancy. LAGEVRIO was studied in pregnant animals only. When LAGEVRIO was given to pregnant animals, LAGEVRIO caused harm to their unborn babies. o You and your healthcare provider may decide that you should take LAGEVRIO during pregnancy if there are no other COVID-19 treatment options approved or authorized by the FDA that are accessible or clinically appropriate for you. o If you and your healthcare provider decide that you should take LAGEVRIO during pregnancy, you and your healthcare provider should discuss the known and potential benefits and the potential risks of taking LAGEVRIO during pregnancy. For individuals who are able to become pregnant: ? You should use a reliable method of birth control (contraception) consistently and correctly during treatment with LAGEVRIO and for 4 days after the last dose of LAGEVRIO. Talk to   your healthcare provider about reliable birth control methods. ? Before starting treatment with LAGEVRIO your healthcare provider may do a pregnancy test to see if you are pregnant before starting treatment with LAGEVRIO. ? Tell your healthcare provider right away if you become pregnant or think you may be pregnant during treatment with LAGEVRIO. Pregnancy Surveillance Program: ? There is a pregnancy surveillance program for individuals who take LAGEVRIO during pregnancy. The  purpose of this program is to collect information about the health of you and your baby. Talk to your healthcare provider about how to take part in this program. ? If you take LAGEVRIO during pregnancy and you agree to participate in the pregnancy surveillance program and allow your healthcare provider to share your information with [employer], then your healthcare provider will report your use of LAGEVRIO during pregnancy to [employer] by calling 1-877-888-4231 or pregnancyreporting.msd.com. For individuals who are sexually active with partners who are able to become pregnant: ? It is not known if LAGEVRIO can affect sperm. While the risk is regarded as low, animal studies to fully assess the potential for LAGEVRIO to affect the babies of males treated with LAGEVRIO have not been completed. A reliable method of birth control (contraception) should be used consistently and correctly during treatment with LAGEVRIO and for at least 3 months after the last dose. The risk to sperm beyond 3 months is not known. Studies to understand the risk to sperm beyond 3 months are ongoing. Talk to your healthcare provider about reliable birth control methods. Talk to your healthcare provider if you have questions or concerns about how LAGEVRIO may affect sperm. You are being given this fact sheet because your healthcare provider believes it is necessary to provide you with LAGEVRIO for the treatment of adults with mild-to-moderate coronavirus disease 2019 (COVID-19) with positive results of direct SARS-CoV-2 viral testing, and who are at high risk for progression to severe COVID-19 including hospitalization or death, and for whom other COVID-19 treatment options approved or authorized by the FDA are not accessible or clinically appropriate. The U.S. Food and Drug Administration (FDA) has issued an Emergency Use Authorization (EUA) to make LAGEVRIO available during the COVID-19 pandemic  (for more details about an EUA please see "What is an Emergency Use Authorization?" at the end of this document). LAGEVRIO is not an FDA-approved medicine in the [place]. Read this Fact Sheet for information about LAGEVRIO. Talk to your healthcare provider about your options if you have any questions. It is your choice to take LAGEVRIO.  What is COVID-19? COVID-19 is caused by a virus called a coronavirus. You can get COVID-19 through close contact with another person who has the virus. COVID-19 illnesses have ranged from very mild-to-severe, including illness resulting in death. While information so far suggests that most COVID-19 illness is mild, serious illness can happen and may cause some of your other medical conditions to become worse. Older people and people of all ages with severe, long lasting (chronic) medical conditions like heart disease, lung disease and diabetes, for example seem to be at higher risk of being hospitalized for COVID-19.  What is LAGEVRIO? LAGEVRIO is an investigational medicine used to treat mild-to-moderate COVID-19 in adults: ? with positive results of direct SARS-CoV-2 viral testing, and ? who are at high risk for progression to severe COVID-19 including hospitalization or death, and for whom other COVID-19 treatment options approved or authorized by the FDA are not accessible or clinically appropriate. The   FDA has authorized the emergency use of LAGEVRIO for the treatment of mild-tomoderate COVID-19 in adults under an EUA. For more information on EUA, see the "What is an Emergency Use Authorization (EUA)?" section at the end of this Fact Sheet. LAGEVRIO is not authorized: ? for use in people less than 18 years of age. ? for prevention of COVID-19. ? for people needing hospitalization for COVID-19. ? for use for longer than 5 consecutive days.  What should I tell my healthcare provider before I take LAGEVRIO? Tell your healthcare provider if  you: ? Have any allergies ? Are breastfeeding or plan to breastfeed ? Have any serious illnesses ? Are taking any medicines (prescription, over-the-counter, vitamins, or herbal products).  How do I take LAGEVRIO? ? Take LAGEVRIO exactly as your healthcare provider tells you to take it. ? Take 4 capsules of LAGEVRIO every 12 hours (for example, at 8 am and at 8 pm) ? Take LAGEVRIO for 5 days. It is important that you complete the full 5 days of treatment with LAGEVRIO. Do not stop taking LAGEVRIO before you complete the full 5 days of treatment, even if you feel better. ? Take LAGEVRIO with or without food. ? You should stay in isolation for as long as your healthcare provider tells you to. Talk to your healthcare provider if you are not sure about how to properly isolate while you have COVID-19. ? Swallow LAGEVRIO capsules whole. Do not open, break, or crush the capsules. If you cannot swallow capsules whole, tell your healthcare provider. ? What to do if you miss a dose: o If it has been less than 10 hours since the missed dose, take it as soon as you remember o If it has been more than 10 hours since the missed dose, skip the missed dose and take your dose at the next scheduled time. ? Do not double the dose of LAGEVRIO to make up for a missed dose.  What are the important possible side effects of LAGEVRIO? ? See, "What is the most important information I should know about LAGEVRIO?" ? Allergic Reactions. Allergic reactions can happen in people taking LAGEVRIO, even after only 1 dose. Stop taking LAGEVRIO and call your healthcare provider right away if you get any of the following symptoms of an allergic reaction: o hives o rapid heartbeat o trouble swallowing or breathing o swelling of the mouth, lips, or face o throat tightness o hoarseness o skin rash The most common side effects of LAGEVRIO are: ? diarrhea ? nausea ? dizziness These are not all the possible side effects  of LAGEVRIO. Not many people have taken LAGEVRIO. Serious and unexpected side effects may happen. This medicine is still being studied, so it is possible that all of the risks are not known at this time.  What other treatment choices are there?  Veklury (remdesivir) is FDA-approved as an intravenous (IV) infusion for the treatment of mildto-moderate COVID-19 in certain adults and children. Talk with your doctor to see if Veklury is appropriate for you. Like LAGEVRIO, FDA may also allow for the emergency use of other medicines to treat people with COVID-19. Go to https://www.fda.gov/emergency-preparedness-and-response/mcm-legalregulatory-and-policy-framework/emergency-use-authorization for more information. It is your choice to be treated or not to be treated with LAGEVRIO. Should you decide not to take it, it will not change your standard medical care.  What if I am breastfeeding? Breastfeeding is not recommended during treatment with LAGEVRIO and for 4 days after the last dose of LAGEVRIO. If you are   breastfeeding or plan to breastfeed, talk to your healthcare provider about your options and specific situation before taking LAGEVRIO.  How do I report side effects with LAGEVRIO? Contact your healthcare provider if you have any side effects that bother you or do not go away. Report side effects to FDA MedWatch at www.fda.gov/medwatch or call 1-800-FDA-1088 (1- 800-332-1088).  How should I store LAGEVRIO? ? Store LAGEVRIO capsules at room temperature between 68F to 77F (20C to 25C). ? Keep LAGEVRIO and all medicines out of the reach of children and pets. How can I learn more about COVID-19? ? Ask your healthcare provider. ? Visit www.cdc.gov/COVID19 ? Contact your local or state public health department. ? Call [employer] at 1-800-672-6372 (toll free in the [place] ? Visit www.molnupiravir.com  What Is an Emergency Use Authorization (EUA)? The [place] FDA has made  LAGEVRIO available under an emergency access mechanism called an Emergency Use Authorization (EUA) The EUA is supported by a Secretary of Health and Human Service (HHS) declaration that circumstances exist to justify emergency use of drugs and biological products during the COVID-19 pandemic. LAGEVRIO for the treatment of mild-to-moderate COVID-19 in adults with positive results of direct SARS-CoV-2 viral testing, who are at high risk for progression to severe COVID-19, including hospitalization or death, and for whom alternative COVID-19 treatment options approved or authorized by FDA are not accessible or clinically appropriate, has not undergone the same type of review as an FDA-approved product. In issuing an EUA under the COVID-19 public health emergency, the FDA has determined, among other things, that based on the total amount of scientific evidence available including data from adequate and well-controlled clinical trials, if available, it is reasonable to believe that the product may be effective for diagnosing, treating, or preventing COVID-19, or a serious or life-threatening disease or condition caused by COVID-19; that the known and potential benefits of the product, when used to diagnose, treat, or prevent such disease or condition, outweigh the known and potential risks of such product; and that there are no adequate, approved, and available alternatives.  All of these criteria must be met to allow for the product to be used in the treatment of patients during the COVID-19 pandemic. The EUA for LAGEVRIO is in effect for the duration of the COVID-19 declaration justifying emergency use of LAGEVRIO, unless terminated or revoked (after which LAGEVRIO may no longer be used under the EUA). For patent information: www.msd.com/research/patent Copyright  2021-2022 [employer] [place], [place] and its affiliates. All rights reserved. [id number] Revised: March  2022  

## 2020-07-19 NOTE — Progress Notes (Signed)
Virtual Visit via Video Note  I connected with [place]  on 07/19/20 at  4:00 PM EDT by a video enabled telemedicine application and verified that I am speaking with the correct person using two identifiers.  Location patient: home, [place] Location provider:work or home office Persons participating in the virtual visit: patient, provider  I discussed the limitations of evaluation and management by telemedicine and the availability of in person appointments. The patient expressed understanding and agreed to proceed.   HPI:  Acute telemedicine visit for Covid19: -Onset: 2 days ago, tested positive for covid -Symptoms include: sore throat, nasal congestion, low grade fever - now resolved, cough, feels tired -doing better today -Denies:CP, sob, vomiting, diarrhea, inability to ear/drink/get out of bed -O2 has been 98% on home pulse ox -Has tried:tylenol, musinex -Pertinent past medical history: see below, obesity  -Pertinent medication allergies:  Allergies  Allergen Reactions   Estrogens     BIH   Zithromax [Azithromycin] Hives   Levonorgestrel-Ethinyl Estrad Other (See Comments)   -COVID-19 vaccine status: 2 vaccines and a booster -s/p hysterectomy  ROS: See pertinent positives and negatives per HPI.  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   Shortness of breath on exertion    Subclinical hypothyroidism    Ventral hernia    Vitamin D deficiency     Past Surgical History:  Procedure Laterality Date   ABLATION ON ENDOMETRIOSIS     ROBOTIC ASSISTED LAPAROSCOPIC PARTIAL CYSTECTOMY       Current Outpatient Medications:     molnupiravir EUA 200 mg CAPS, Take 4 capsules (800 mg total) by mouth 2 (two) times daily for 5 days., Disp: 40 capsule, Rfl: 0   Ascorbic Acid (VITAMIN C) 1000 MG tablet, Take 1,000 mg by mouth daily., Disp: , Rfl:    calcium carbonate (TUMS - DOSED IN MG ELEMENTAL CALCIUM) 500 MG chewable tablet, Chew 1 tablet by mouth as needed for indigestion or heartburn., Disp: , Rfl:    Cetirizine HCl (ZYRTEC PO), Take 1 tablet by mouth daily., Disp: , Rfl:    melatonin 5 MG TABS, Take 5 mg by mouth at bedtime., Disp: , Rfl:    Multiple Vitamins-Minerals (MULTIVITAMIN PO), Take 1 tablet by mouth daily., Disp: , Rfl:    OVER THE COUNTER MEDICATION, Take 1 each by mouth daily. Take 1 Zinc gummy by mouth once daily., Disp: , Rfl:    Vitamin D, Cholecalciferol, 25 MCG (1000 UT) TABS, Take 2 tablets by mouth daily., Disp: , Rfl:   EXAM:  VITALS per patient if applicable:  GENERAL: alert, oriented, appears well and in no acute distress  HEENT: atraumatic, conjunttiva clear, no obvious abnormalities on inspection of external nose and ears  NECK: normal movements of the head and neck  LUNGS: on inspection no signs of respiratory distress, breathing rate appears normal, no obvious gross SOB, gasping or wheezing  CV: no obvious cyanosis  MS: moves all visible extremities without noticeable abnormality  PSYCH/NEURO: pleasant and cooperative, no obvious depression or anxiety, speech  and thought processing grossly intact  ASSESSMENT AND PLAN:  Discussed the following assessment and plan:  COVID-19   Discussed treatment options, ideal treatment window, potential complications, isolation and precautions for COVID-19.  After lengthy discussion, the patient opted for treatment with molnupiravir due to being higher risk for complications of covid or severe disease and other factors. Discussed EUA status of this drug and the fact that there is preliminary limited knowledge of risks/interactions/side effects  per EUA document vs possible benefits and precautions. This information was shared with patient during the visit and also was provided in patient instructions. Also, advised that patient discuss risks/interactions and use with pharmacist/treatment team as well. Other symptomatic care measures summarized in patient instructions. Work/School slipped offered:declined Advised to seek prompt in person care if worsening, new symptoms arise, or if is not improving with treatment. Discussed options for inperson care if PCP office not available. Did let this patient know that I only do telemedicine on Tuesdays and Thursdays for [place]. Advised to schedule follow up visit with PCP or [facility] if any further questions or concerns to avoid delays in care.   I discussed the assessment and treatment plan with the patient. The patient was provided an opportunity to ask questions and all were answered. The patient agreed with the plan and demonstrated an understanding of the instructions.     Lucretia Kern, DO

## 2020-07-20 NOTE — Telephone Encounter (Signed)
Dr. Maudie Mercury, please see message and advise.

## 2020-07-21 NOTE — Telephone Encounter (Signed)
Left message on voicemail to call office.  

## 2020-07-21 NOTE — Telephone Encounter (Signed)
Pt called back told her sorry didn't get back to you right away Dr. Maudie Mercury was not in office she got back to me today. She said these are common side effects with this medication, but can also be symptoms with covid. Unfortunately all medications have side effects. I hope she is feeling better. If not she should schedule a visit with her primary care doctor or go to urgent care if feeling worse. Pt verbalized understanding and said the symptoms have subsided she has taken the medication two more times and has been okay. Told pt good. Pt asked when she can go back out in the world. Told pt as long as she is fever free for 24 hours and wears a mask around others for 5-10 days she is fine. Pt asked if she can walk outside. Told pt as long as she is feeling up to it that is fine, just please make sure you are drinking plenty. Pt verbalized understanding. Told pt if she has any more problems or symptoms worsen please call the office. Pt verbalized understanding.

## 2020-07-28 ENCOUNTER — Encounter: Payer: Self-pay | Admitting: Physical Therapy

## 2020-07-28 ENCOUNTER — Ambulatory Visit: Payer: BC Managed Care – PPO | Attending: Family Medicine | Admitting: Physical Therapy

## 2020-07-28 ENCOUNTER — Other Ambulatory Visit: Payer: Self-pay

## 2020-07-28 DIAGNOSIS — M6281 Muscle weakness (generalized): Secondary | ICD-10-CM

## 2020-07-28 DIAGNOSIS — R279 Unspecified lack of coordination: Secondary | ICD-10-CM | POA: Diagnosis not present

## 2020-07-28 NOTE — Therapy (Signed)
[facility] [address], [place], [place], [address] Phone: [phone]   Fax:  [phone]  Physical Therapy Treatment  Patient Details  Name: Erika Garrett MRN: [id number] Date of Birth: 05/23/1980 Referring Provider (PT): Orma Flaming, MD   Encounter Date: 07/28/2020   PT End of Session - 07/28/20 1309     Visit Number 9    Date for PT Re-Evaluation 08/09/20    Authorization Type bcbs    PT Start Time 1235    PT Stop Time 6237    PT Time Calculation (min) 38 min    Activity Tolerance Patient tolerated treatment well    Behavior During Therapy [facility] for tasks assessed/performed             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   Subjective Assessment - 07/28/20 1240     Subjective Pt has had Covid and missed appoinments last month.  Pt states she has been doing less and just feeling weaker                               [facility] Adult PT Treatment/Exercise - 07/28/20 0001       Lumbar Exercises: Standing   Row Strengthening;Both   30x   Theraband Level (Row) Level 4 (Blue)    Shoulder Extension Strengthening;Both;Theraband   30x   Theraband Level (Shoulder Extension) Level 3 (Green)    Other Standing Lumbar Exercises standing with  pelvic tilt - horizontal abduction, ER, lifting 2lb; hip ER blue loop                         PT Long Term Goals - 06/28/20 1615       PT LONG TERM GOAL #1   Title Pt will be ind with advanced HEP    Baseline progressed today    Status On-going                   Plan - 07/28/20 1356     Clinical Impression Statement Today patient was able to progress strengthening program.  She fatigued easily which may be due to recent reduction in activity level from [place]  Pt needs cues to work on posture and did well with exercises using the wall as cue to decrease excess lumbar extension.  Pt will benefit from skilled PT to continue to address impairments and ensure she is independent with exercises for core strengthening.    PT Treatment/Interventions ADLs/Self Care Home Management;Biofeedback;Cryotherapy;Software engineer;Therapeutic activities;Therapeutic exercise;Neuromuscular re-education;Patient/family education;Manual techniques;Dry needling;Passive range of motion;Taping  PT Next Visit Plan progression core strength; f/u with any leakage, scar adhesion, HEP    PT Home Exercise Plan Access Code: [email or web address]    Consulted and Agree with Plan of Care Patient             Patient will benefit from skilled therapeutic intervention in order to improve the following deficits and impairments:  Pain, Postural dysfunction, Decreased strength, Impaired flexibility, Impaired tone, Increased fascial restricitons, Difficulty walking, Decreased range of motion, Decreased coordination, Increased muscle spasms  Visit Diagnosis: Muscle weakness (generalized)  Unspecified lack of coordination     Problem List Patient Active Problem List   Diagnosis Date Noted   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, [place], [place], [address] Phone: [phone]   Fax:  [phone]  Name: Darleen Moffitt MRN: [id number] Date of Birth: 05-25-1980

## 2020-08-11 ENCOUNTER — Encounter: Payer: BC Managed Care – PPO | Admitting: Physical Therapy

## 2020-08-17 ENCOUNTER — Ambulatory Visit (INDEPENDENT_AMBULATORY_CARE_PROVIDER_SITE_OTHER): Payer: BC Managed Care – PPO | Admitting: Family Medicine

## 2020-08-23 ENCOUNTER — Ambulatory Visit: Payer: BC Managed Care – PPO | Attending: Family Medicine | Admitting: Physical Therapy

## 2020-08-23 ENCOUNTER — Other Ambulatory Visit: Payer: Self-pay

## 2020-08-23 ENCOUNTER — Encounter: Payer: Self-pay | Admitting: Physical Therapy

## 2020-08-23 DIAGNOSIS — M6281 Muscle weakness (generalized): Secondary | ICD-10-CM | POA: Diagnosis not present

## 2020-08-23 DIAGNOSIS — R279 Unspecified lack of coordination: Secondary | ICD-10-CM | POA: Insufficient documentation

## 2020-08-23 NOTE — Therapy (Signed)
[facility] [address], [place], [place], [address] Phone: [phone]   Fax:  [phone]  Physical Therapy Treatment  Patient Details  Name: Erika Garrett MRN: [id number] Date of Birth: [birth year] Referring Provider (PT): Orma Flaming, MD   Encounter Date: 08/23/2020   PT End of Session - 08/23/20 1520     Visit Number 10    Date for PT Re-Evaluation 08/23/20    Authorization Type bcbs    PT Start Time 1233    PT Stop Time 8185    PT Time Calculation (min) 40 min    Activity Tolerance Patient tolerated treatment well    Behavior During Therapy [facility] for tasks assessed/performed             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   Subjective Assessment - 08/23/20 1248     Subjective Pt had setback in upper back with tension since having covid and then doing a lot of lifting for work    Patient Stated Goals get back to walking, feel core is stronger, keep the scar tissue loose    Currently in Pain? Yes    Pain Score 5     Pain Location Rib cage    Pain Orientation Left    Pain Descriptors / Indicators Discomfort    Pain Type Acute pain    Pain Radiating Towards towards left breast tissue    Pain Onset 1 to 4 weeks ago    Pain Frequency Intermittent     Multiple Pain Sites No                [facility] PT Assessment - 08/23/20 0001       Strength   Overall Strength Comments bil hip 5/5                        Pelvic Floor Special Questions - 08/23/20 0001     Erika Garrett    Urinary Leakage No               [facility] Adult PT Treatment/Exercise - 08/23/20 0001       Lumbar Exercises: Supine   Other Supine Lumbar Exercises serratus punches      Manual Therapy   Soft tissue mobilization thoracic erectors, lumbar, pecs very tight and TTP                         PT Long Term Goals - 08/23/20 1241       PT LONG TERM GOAL #1   Title Pt will be ind with advanced  HEP    Status Achieved      PT LONG TERM GOAL #2   Title Pt will report feeling back at least 75% back to normal strength in her core.    Baseline I feel good with pelvic area, but my back doesn't feel painful just tighter than normal since covid    Status Achieved      PT LONG TERM GOAL #3   Title Pt will be 5/5 strength in bilateral hips for improved gait mechanics for return to walking program    Baseline walking feels normal and core and pelvic area feels strong    Status Achieved      PT LONG TERM GOAL #4   Title pt will report only 1/3 Erika scale at most    Status Achieved      PT LONG TERM GOAL #5   Title Pt will be able to laugh for as long as she wants without feeling like she will have leakage    Status Achieved                   Plan - 08/23/20 1517     Clinical Impression Statement Pt presents to clinic with new pain today.  Other than that, all goals that initially brought her to skilled PT have been resolved and goals met.  Pt will d/c with HEP today    PT Treatment/Interventions ADLs/Self Care Home Management;Biofeedback;Cryotherapy;Software engineer;Therapeutic activities;Therapeutic exercise;Neuromuscular re-education;Patient/family education;Manual  techniques;Dry needling;Passive range of motion;Taping    PT Next Visit Plan d/c today    PT Home Exercise Plan Access Code: [email or web address]    Consulted and Agree with Plan of Care Patient             Patient will benefit from skilled therapeutic intervention in order to improve the following deficits and impairments:  Pain, Postural dysfunction, Decreased strength, Impaired flexibility, Impaired tone, Increased fascial restricitons, Difficulty walking, Decreased range of motion, Decreased coordination, Increased muscle spasms  Visit Diagnosis: Muscle weakness (generalized)  Unspecified lack of coordination     Problem List Patient Active Problem List   Diagnosis Date Noted   maternal ovarian cancer  11/20/2019   Allergic contact dermatitis 08/23/2019   Chronic rhinitis 08/13/2019   Endometriosis of pelvis 06/25/2017   Subclinical hypothyroidism 06/25/2017   Obesity (BMI 30-39.9) 06/25/2017   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, [place], [place], [address] Phone: [phone]   Fax:  [phone]  Name: Erika Garrett MRN: [id number] Date of Birth: September 13, 1980   PHYSICAL THERAPY DISCHARGE SUMMARY  Visits from Start of Care: 10  Current functional level related to goals / functional outcomes: See above goals met   Remaining deficits: See above   Education / Equipment: HEP   Patient agrees to discharge. Patient goals were met. Patient is being discharged due to meeting the stated rehab goals.  Gustavus Bryant, PT 08/23/20 3:35 PM

## 2020-08-29 ENCOUNTER — Ambulatory Visit: Payer: BC Managed Care – PPO | Admitting: Physician Assistant

## 2020-08-29 ENCOUNTER — Encounter: Payer: Self-pay | Admitting: Physician Assistant

## 2020-08-29 ENCOUNTER — Other Ambulatory Visit: Payer: Self-pay

## 2020-08-29 VITALS — BP 123/82 | HR 89 | Temp 97.2°F | Ht 67.0 in | Wt 202.2 lb

## 2020-08-29 DIAGNOSIS — M791 Myalgia, unspecified site: Secondary | ICD-10-CM | POA: Diagnosis not present

## 2020-08-29 DIAGNOSIS — R253 Fasciculation: Secondary | ICD-10-CM

## 2020-08-29 DIAGNOSIS — G8929 Other chronic pain: Secondary | ICD-10-CM

## 2020-08-29 DIAGNOSIS — U099 Post covid-19 condition, unspecified: Secondary | ICD-10-CM | POA: Diagnosis not present

## 2020-08-29 MED ORDER — MELOXICAM 7.5 MG PO TABS
7.5000 mg | ORAL_TABLET | Freq: Every day | ORAL | 0 refills | Status: DC
Start: 2020-08-29 — End: 2020-09-07

## 2020-08-29 MED ORDER — METHOCARBAMOL 500 MG PO TABS: 500.0000 mg | ORAL_TABLET | Freq: Four times a day (QID) | ORAL | 0 refills | Status: AC

## 2020-08-29 NOTE — Progress Notes (Signed)
Established Patient Office Visit  Subjective:  Patient ID: Erika Garrett, female    DOB: April 21, 1980  Age: 40 y.o. MRN: [id number]  CC:  Chief Complaint  Patient presents with   Eye Problem    Right eyelid   Breast Pain    Left side    HPI Erika Garrett presents for COVID at the end of June 2022. She was given molnupiravir and had diarrhea as a side effect, otherwise no issues.   She has several complaints since having COVID. Right eyelid twitch. Tension in neck and shoulders. Also feeling pain in left side of breast. Feels bruised. Not cyclical. No rash or redness. Massaging her muscles does help the pain. Tylenol and Ibuprofen the first week did not seem to help much.   Last mammogram done at [facility] on 03/16/20 and no issues.   No SOB or chest pain. No headaches. Weight gain of 5 lbs per patient, which she relates to travel for work.   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   Shortness of breath on exertion    Subclinical hypothyroidism    Ventral hernia    Vitamin D deficiency     Past Surgical History:  Procedure Laterality Date   ABLATION ON ENDOMETRIOSIS     ROBOTIC ASSISTED LAPAROSCOPIC PARTIAL CYSTECTOMY      Family History  Problem Relation Age of Onset   Ovarian cancer Mother    Colon polyps Mother    Cancer Mother    Endocrine tumor Father    Alcohol abuse Father    Pancreatic cancer Father    Diabetes Father    Cancer Father    Alcoholism Father    Healthy Sister    Alzheimer's disease Maternal Grandmother    Cancer Maternal Grandmother    Hemachromatosis Paternal Grandmother    Colon cancer  Neg Hx    Esophageal cancer Neg Hx    Stomach cancer Neg Hx    Rectal cancer Neg Hx     Social History   Socioeconomic History   Marital status: Significant Other    Spouse name: Marciano Sequin   Number of children: 0   Years of education: BA   Highest education level: Not on file  Occupational History   Occupation: Doctor, hospital  Tobacco Use   Smoking status: Former    Types: Cigarettes   Smokeless tobacco: Never   Tobacco comments:    socially  Scientific laboratory technician Use: Never used  Substance and Sexual Activity   Alcohol use: Yes    Comment: occasional   Drug use: Never   Sexual activity: Yes    Partners: Male    Birth control/protection: Condom  Other Topics Concern   Not on file  Social History Narrative   Lives w/ long term boyfriend   Caffeine use: 2-3 cups coffee per day   Left handed    Social Determinants of Health   Financial Resource Strain: Not on file  Food Insecurity: Not on file  Transportation  Needs: Not on file  Physical Activity: Not on file  Stress: Not on file  Social Connections: Not on file  Intimate Partner Violence: Not on file    Outpatient Medications Prior to Visit  Medication Sig Dispense Refill   Ascorbic Acid (VITAMIN C) 1000 MG tablet Take 1,000 mg by mouth daily.     calcium carbonate (TUMS - DOSED IN MG ELEMENTAL CALCIUM) 500 MG chewable tablet Chew 1 tablet by mouth as needed for indigestion or heartburn.     Cetirizine HCl (ZYRTEC PO) Take 1 tablet by mouth daily.     Multiple Vitamins-Minerals (MULTIVITAMIN PO) Take 1 tablet by mouth daily.     OVER THE COUNTER MEDICATION Take 1 each by mouth daily. Take 1 Zinc gummy by mouth once daily.     Vitamin D, Cholecalciferol, 25 MCG (1000 UT) TABS Take 2 tablets by mouth daily.     melatonin 5 MG TABS Take 5 mg by mouth at bedtime.     No facility-administered medications prior to visit.    Allergies  Allergen Reactions   Estrogens     BIH   Zithromax  [Azithromycin] Hives   Levonorgestrel-Ethinyl Estrad Other (See Comments)    ROS Review of Systems REFER TO HPI FOR PERTINENT POSITIVES AND NEGATIVES    Objective:    Physical Exam Vitals and nursing note reviewed.  Constitutional:      General: She is not in acute distress.    Appearance: Normal appearance. She is normal weight.  HENT:     Head: Normocephalic.     Right Ear: External ear normal.     Left Ear: External ear normal.     Nose: Nose normal.     Mouth/Throat:     Mouth: Mucous membranes are moist.  Eyes:     Extraocular Movements: Extraocular movements intact.     Conjunctiva/sclera: Conjunctivae normal.     Pupils: Pupils are equal, round, and reactive to light.  Cardiovascular:     Rate and Rhythm: Normal rate and regular rhythm.     Pulses: Normal pulses.     Heart sounds: No murmur heard. Pulmonary:     Effort: Pulmonary effort is normal.     Breath sounds: Normal breath sounds.  Chest:     Comments: Left breast exam: No erythema. No rashes. No masses noted. She is tender to palpation chest wall just underneath breast.  Abdominal:     General: Abdomen is flat. Bowel sounds are normal.     Palpations: Abdomen is soft.     Tenderness: There is no abdominal tenderness.  Musculoskeletal:        General: Normal range of motion.     Cervical back: Normal range of motion.     Comments: Some tenderness to palpation along upper back muscles and left shoulder rhomboids.   Skin:    General: Skin is warm.  Neurological:     General: No focal deficit present.     Mental Status: She is alert and oriented to person, place, and time.     Gait: Gait normal.  Psychiatric:        Mood and Affect: Mood normal.        Behavior: Behavior normal.    BP 123/82   Pulse 89   Temp (!) 97.2 F (36.2 C)   Ht '5\' 7"'$  (1.702 m)   Wt 202 lb 3.2 oz (91.7 kg)   LMP 11/27/2019   SpO2 98%   BMI 31.67 kg/m  Wt Readings from Last 3 Encounters:  08/29/20 202 lb 3.2 oz (91.7  kg)  06/15/20 193 lb (87.5 kg)  05/24/20 191 lb (86.6 kg)     Health Maintenance Due  Topic Date Due   Pneumococcal Vaccine 80-35 Years old (1 - PCV) Never done   PAP SMEAR-Modifier  04/22/2020   INFLUENZA VACCINE  08/22/2020    There are no preventive care reminders to display for this patient.  Lab Results  Component Value Date   TSH 2.38 02/01/2020   Lab Results  Component Value Date   WBC 9.7 02/01/2020   HGB 14.7 02/01/2020   HCT 43.0 02/01/2020   MCV 90.3 02/01/2020   PLT 258.0 02/01/2020   Lab Results  Component Value Date   NA 139 03/17/2020   K 4.3 03/17/2020   CO2 18 (L) 03/17/2020   GLUCOSE 81 03/17/2020   BUN 17 03/17/2020   CREATININE 0.63 03/17/2020   BILITOT 0.4 03/17/2020   ALKPHOS 105 03/17/2020   AST 42 (H) 03/17/2020   ALT 57 (H) 03/17/2020   PROT 7.4 03/17/2020   ALBUMIN 4.9 (H) 03/17/2020   CALCIUM 9.4 03/17/2020   GFR 108.64 02/01/2020   Lab Results  Component Value Date   CHOL 233 (H) 03/17/2020   Lab Results  Component Value Date   HDL 45 03/17/2020   Lab Results  Component Value Date   LDLCALC 144 (H) 03/17/2020   Lab Results  Component Value Date   TRIG 242 (H) 03/17/2020   Lab Results  Component Value Date   CHOLHDL 5.0 (H) 11/20/2019   Lab Results  Component Value Date   HGBA1C 5.2 03/17/2020      Assessment & Plan:   Problem List Items Addressed This Visit   None Visit Diagnoses     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    Dispense:  40 tablet    Refill:  0   meloxicam (MOBIC) 7.5 MG tablet    Sig: Take 1 tablet (7.5 mg total) by mouth daily.    Dispense:  30 tablet    Refill:  0     Follow-up: Return in about 2 weeks (around 09/12/2020) for recheck.   Plan: I reassured patient that I do think this may be related to her COVID-19 illness. Symptoms seem MSK in nature. Breast exam was reassuring. Will treat with Robaxin and Mobic, heat / warm compresses, and gentle massage. Recheck in 2 weeks. If worse or no improvement may need to consider imaging at that time.   Total time spent on encounter today including H & P, plan, and documentation was 35 minutes.    Joei Frangos M Drayk Humbarger, PA-C

## 2020-08-29 NOTE — Patient Instructions (Signed)
Take the medications as directed. Continue heat / warm compresses at least 3x daily. Gently stretch and massage areas. Recheck in 2 weeks.

## 2020-09-05 ENCOUNTER — Ambulatory Visit: Payer: BC Managed Care – PPO | Admitting: Physician Assistant

## 2020-09-07 ENCOUNTER — Other Ambulatory Visit: Payer: Self-pay

## 2020-09-07 ENCOUNTER — Encounter (INDEPENDENT_AMBULATORY_CARE_PROVIDER_SITE_OTHER): Payer: Self-pay | Admitting: Family Medicine

## 2020-09-07 ENCOUNTER — Ambulatory Visit (INDEPENDENT_AMBULATORY_CARE_PROVIDER_SITE_OTHER): Payer: BC Managed Care – PPO | Admitting: Family Medicine

## 2020-09-07 VITALS — BP 122/3 | HR 96 | Temp 98.2°F | Ht 67.0 in

## 2020-09-07 DIAGNOSIS — E782 Mixed hyperlipidemia: Secondary | ICD-10-CM | POA: Diagnosis not present

## 2020-09-07 DIAGNOSIS — R7989 Other specified abnormal findings of blood chemistry: Secondary | ICD-10-CM

## 2020-09-07 DIAGNOSIS — Z9189 Other specified personal risk factors, not elsewhere classified: Secondary | ICD-10-CM | POA: Diagnosis not present

## 2020-09-07 DIAGNOSIS — Z6831 Body mass index (BMI) 31.0-31.9, adult: Secondary | ICD-10-CM | POA: Diagnosis not present

## 2020-09-07 DIAGNOSIS — E669 Obesity, unspecified: Secondary | ICD-10-CM | POA: Diagnosis not present

## 2020-09-08 NOTE — Progress Notes (Signed)
Chief Complaint:   OBESITY Erika Garrett is here to discuss her progress with her obesity treatment plan along with follow-up of her obesity related diagnoses. Erika Garrett is on the Category 2 Plan and states she is following her eating plan approximately 0% of the time. [name] states she is riding the peloton bike for 30 minutes 6 times per week.  Today's visit was #: 7 Starting weight: 200 lbs Starting date: 03/17/2020 Today's weight: 194 lbs Today's date: 09/07/2020 Total lbs lost to date: 6 Total lbs lost since last in-office visit: 0  Interim History: Erika Garrett's last visit was approximately 3 months ago. She has done a lot of traveling and eating out, but she has done a good job minimizing weight gain. She has started back to exercising regularly.  Subjective:   1. Elevated LFTs [name] is working on diet and weight loss, and she is trying to decrease simple carbohydrates.  2. Mixed hyperlipidemia Erika Garrett is working on diet and exercise, and she is not on statin. She is trying to decrease high cholesterol foods.  3. At risk for impaired function of liver Erika Garrett is at risk for impaired function of liver due to likely diagnosis of fatty liver as evidenced by recent elevated liver enzymes.   Assessment/Plan:   1. Elevated LFTs We discussed the likely diagnosis of non-alcoholic fatty liver disease today and how this condition is obesity related. Erika Garrett was educated the importance of weight loss. Erika Garrett agreed to continue with her weight loss efforts with healthier diet and exercise as an essential part of her treatment plan. We will recheck labs in 1 month.  2. Mixed hyperlipidemia Cardiovascular risk and specific lipid/LDL goals reviewed. We discussed several lifestyle modifications today. Erika Garrett will continue to work on diet, exercise and weight loss efforts. We will recheck labs in 1 month. Orders and follow up as documented in patient record.   Counseling Intensive lifestyle modifications are the first  line treatment for this issue. Dietary changes: Increase soluble fiber. Decrease simple carbohydrates. Exercise changes: Moderate to vigorous-intensity aerobic activity 150 minutes per week if tolerated. Lipid-lowering medications: see documented in medical record.   3. At risk for impaired function of liver Erika Garrett was given approximately 15 minutes of counseling today regarding prevention of impaired liver function. Erika Garrett was educated about her risk of developing NASH or even liver failure and advised that the only proven treatment for NAFLD was weight loss of at least 5-10% of body weight.   4. Obesity with current BMI 30.4 Erika Garrett is currently in the action stage of change. As such, her goal is to continue with weight loss efforts. She has agreed to change to following a lower carbohydrate, vegetable and lean protein rich diet plan.   We will recheck fasting labs at his next visit.  Exercise goals: As is.  Behavioral modification strategies: increasing lean protein intake and meal planning and cooking strategies.  Erika Garrett has agreed to follow-up with our clinic in 4 weeks. She was informed of the importance of frequent follow-up visits to maximize her success with intensive lifestyle modifications for her multiple health conditions.   Objective:   Blood pressure (!) 122/3, pulse 96, temperature 98.2 F (36.8 C), height '5\' 7"'$  (1.702 m), last menstrual period 11/27/2019, SpO2 97 %. Body mass index is 31.67 kg/m.  General: Cooperative, alert, well developed, in no acute distress. HEENT: Conjunctivae and lids unremarkable. Cardiovascular: Regular rhythm.  Lungs: Normal work of breathing. Neurologic: No focal deficits.   Lab Results  Component Value Date   CREATININE 0.63 03/17/2020   BUN 17 03/17/2020   NA 139 03/17/2020   K 4.3 03/17/2020   CL 101 03/17/2020   CO2 18 (L) 03/17/2020   Lab Results  Component Value Date   ALT 57 (H) 03/17/2020   AST 42 (H) 03/17/2020   ALKPHOS 105  03/17/2020   BILITOT 0.4 03/17/2020   Lab Results  Component Value Date   HGBA1C 5.2 03/17/2020   Lab Results  Component Value Date   INSULIN 6.4 03/17/2020   Lab Results  Component Value Date   TSH 2.38 02/01/2020   Lab Results  Component Value Date   CHOL 233 (H) 03/17/2020   HDL 45 03/17/2020   LDLCALC 144 (H) 03/17/2020   LDLDIRECT 135.0 09/17/2018   TRIG 242 (H) 03/17/2020   CHOLHDL 5.0 (H) 11/20/2019   Lab Results  Component Value Date   VD25OH 33.06 02/01/2020   VD25OH 37.29 07/11/2017   Lab Results  Component Value Date   WBC 9.7 02/01/2020   HGB 14.7 02/01/2020   HCT 43.0 02/01/2020   MCV 90.3 02/01/2020   PLT 258.0 02/01/2020   No results found for: IRON, TIBC, FERRITIN  Attestation Statements:   Reviewed by clinician on day of visit: allergies, medications, problem list, medical history, surgical history, family history, social history, and previous encounter notes.   I, Trixie Dredge, am acting as transcriptionist for Dennard Nip, MD.  I have reviewed the above documentation for accuracy and completeness, and I agree with the above. -  Dennard Nip, MD

## 2020-09-13 ENCOUNTER — Other Ambulatory Visit: Payer: Self-pay

## 2020-09-13 ENCOUNTER — Encounter: Payer: Self-pay | Admitting: Physician Assistant

## 2020-09-13 ENCOUNTER — Ambulatory Visit: Payer: BC Managed Care – PPO | Admitting: Physician Assistant

## 2020-09-13 VITALS — BP 128/87 | HR 88 | Temp 98.1°F | Ht 67.0 in | Wt 202.2 lb

## 2020-09-13 DIAGNOSIS — R253 Fasciculation: Secondary | ICD-10-CM

## 2020-09-13 DIAGNOSIS — M791 Myalgia, unspecified site: Secondary | ICD-10-CM

## 2020-09-13 DIAGNOSIS — G8929 Other chronic pain: Secondary | ICD-10-CM | POA: Diagnosis not present

## 2020-09-13 DIAGNOSIS — U099 Post covid-19 condition, unspecified: Secondary | ICD-10-CM

## 2020-09-13 NOTE — Patient Instructions (Signed)
Call or send MyChart f/up message in 2 weeks. If worse or no improvement will consider sports medicine referral.

## 2020-09-13 NOTE — Progress Notes (Signed)
Established Patient Office Visit  Subjective:  Patient ID: Erika Garrett, female    DOB: 04-24-1980  Age: 40 y.o. MRN: [id number]  CC:  Chief Complaint  Patient presents with   Follow-up    HPI Erika Garrett presents for recheck from 08/29/20.   Left sided chest wall pain s/p COVID-19:  Improving. She has been doing exercises and gentle massage for the serratus muscle. Biofreeze is helping tremendously as well. Feels "bruised" sometimes and the "knots" move around this muscle. She did not take the Mobic due to concern for side effect. Only took Robaxin at bedtime. She has been doing Aleve as well. Also feels like her eyelid twitching has improved.  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   Shortness of breath on exertion    Subclinical hypothyroidism    Ventral hernia    Vitamin D deficiency     Past Surgical History:  Procedure Laterality Date   ABLATION ON ENDOMETRIOSIS     ROBOTIC ASSISTED LAPAROSCOPIC PARTIAL CYSTECTOMY      Family History  Problem Relation Age of Onset   Ovarian cancer Mother    Colon polyps Mother    Cancer Mother    Endocrine tumor Father    Alcohol abuse Father    Pancreatic cancer Father    Diabetes Father    Cancer Father    Alcoholism Father    Healthy Sister    Alzheimer's disease Maternal Grandmother    Cancer Maternal Grandmother    Hemachromatosis Paternal Grandmother    Colon cancer Neg Hx    Esophageal cancer Neg Hx    Stomach cancer Neg Hx    Rectal cancer Neg Hx     Social History   Socioeconomic History   Marital status: Significant Other    Spouse name: Marciano Sequin   Number of children: 0   Years of education: BA   Highest education level: Not on file  Occupational History   Occupation: Doctor, hospital  Tobacco Use   Smoking status: Former    Types: Cigarettes   Smokeless tobacco: Never   Tobacco comments:    socially  Scientific laboratory technician Use: Never used  Substance and Sexual Activity   Alcohol use: Yes    Comment: occasional   Drug use: Never   Sexual activity: Yes    Partners: Male    Birth control/protection: Condom  Other Topics Concern   Not on file  Social History Narrative   Lives w/ long term boyfriend   Caffeine use: 2-3 cups coffee per day   Left handed    Social Determinants of Health   Financial Resource Strain: Not on file  Food Insecurity: Not on file  Transportation Needs: Not on file  Physical Activity: Not on file  Stress: Not on file  Social Connections: Not on file  Intimate Partner Violence: Not on file    Outpatient Medications Prior to Visit  Medication Sig Dispense Refill   Ascorbic Acid (VITAMIN C) 1000 MG tablet  Take 1,000 mg by mouth daily.     calcium carbonate (TUMS - DOSED IN MG ELEMENTAL CALCIUM) 500 MG chewable tablet Chew 1 tablet by mouth as needed for indigestion or heartburn.     Cetirizine HCl (ZYRTEC PO) Take 1 tablet by mouth daily.     Multiple Vitamins-Minerals (MULTIVITAMIN PO) Take 1 tablet by mouth daily.     OVER THE COUNTER MEDICATION Take 1 each by mouth daily. Take 1 Zinc gummy by mouth once daily.     Vitamin D, Cholecalciferol, 25 MCG (1000 UT) TABS Take 2 tablets by mouth daily.     No facility-administered medications prior to visit.    Allergies  Allergen Reactions   Estrogens     BIH   Zithromax [Azithromycin] Hives   Levonorgestrel-Ethinyl Estrad Other (See Comments)    ROS Review of Systems REFER TO HPI FOR PERTINENT POSITIVES AND NEGATIVES    Objective:    Physical Exam Vitals and nursing note reviewed.  Constitutional:      General: She  is not in acute distress.    Appearance: Normal appearance. She is normal weight.  HENT:     Head: Normocephalic.     Right Ear: External ear normal.     Left Ear: External ear normal.     Nose: Nose normal.     Mouth/Throat:     Mouth: Mucous membranes are moist.  Eyes:     Extraocular Movements: Extraocular movements intact.     Conjunctiva/sclera: Conjunctivae normal.     Pupils: Pupils are equal, round, and reactive to light.  Cardiovascular:     Rate and Rhythm: Normal rate and regular rhythm.     Pulses: Normal pulses.     Heart sounds: No murmur heard. Pulmonary:     Effort: Pulmonary effort is normal.     Breath sounds: Normal breath sounds.  Abdominal:     General: Abdomen is flat. Bowel sounds are normal.     Palpations: Abdomen is soft.     Tenderness: There is no abdominal tenderness.  Musculoskeletal:        General: Normal range of motion.     Cervical back: Normal range of motion.     Comments: She is still having some tenderness to palpation along her left chest wall.  Very pinpoint and it does feel like there is a superficial movable area of inflammation.  Skin:    General: Skin is warm.  Neurological:     General: No focal deficit present.     Mental Status: She is alert and oriented to person, place, and time.     Gait: Gait normal.  Psychiatric:        Mood and Affect: Mood normal.        Behavior: Behavior normal.    BP 128/87   Pulse 88   Temp 98.1 F (36.7 C)   Ht '5\' 7"'$  (1.702 m)   Wt 202 lb 3.2 oz (91.7 kg)   LMP 11/27/2019   BMI 31.67 kg/m  Wt Readings from Last 3 Encounters:  09/13/20 202 lb 3.2 oz (91.7 kg)  08/29/20 202 lb 3.2 oz (91.7 kg)  06/15/20 193 lb (87.5 kg)     Health Maintenance Due  Topic Date Due   Pneumococcal Vaccine 3-56 Years old (1 - PCV) Never done   PAP SMEAR-Modifier  04/22/2020   INFLUENZA VACCINE  08/22/2020    There are no preventive care reminders to display for this patient.  Lab Results  Component  Value Date   TSH 2.38 02/01/2020   Lab Results  Component Value Date   WBC 9.7 02/01/2020   HGB 14.7 02/01/2020   HCT 43.0 02/01/2020   MCV 90.3 02/01/2020   PLT 258.0 02/01/2020   Lab Results  Component Value Date   NA 139 03/17/2020   K 4.3 03/17/2020   CO2 18 (L) 03/17/2020   GLUCOSE 81 03/17/2020   BUN 17 03/17/2020   CREATININE 0.63 03/17/2020   BILITOT 0.4 03/17/2020   ALKPHOS 105 03/17/2020   AST 42 (H) 03/17/2020   ALT 57 (H) 03/17/2020   PROT 7.4 03/17/2020   ALBUMIN 4.9 (H) 03/17/2020   CALCIUM 9.4 03/17/2020   GFR 108.64 02/01/2020   Lab Results  Component Value Date   CHOL 233 (H) 03/17/2020   Lab Results  Component Value Date   HDL 45 03/17/2020   Lab Results  Component Value Date   LDLCALC 144 (H) 03/17/2020   Lab Results  Component Value Date   TRIG 242 (H) 03/17/2020   Lab Results  Component Value Date   CHOLHDL 5.0 (H) 11/20/2019   Lab Results  Component Value Date   HGBA1C 5.2 03/17/2020      Assessment & Plan:   Problem List Items Addressed This Visit   None Visit Diagnoses     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  It does seem like symptoms are improving, although she does have lingering areas of inflammation.  I encouraged her to keep using her Biofreeze and gentle massage.  The Robaxin is fine to use at night and I encouraged her to try the meloxicam for 7 to 10 days.  She says she is just very nervous about taking medication, which I understand.  I offered her a referral to PT to consider dry needling, and she said she did not have a good experience with this before.  I also offered sports medicine for maybe a trigger point injection, which she might consider.  If she is not better in the next few weeks I think we will refer to sports medicine at that time.  2. Eyelid twitch Resolved  Follow-up: No  follow-ups on file.    Ayvah Caroll M Sanyia Dini, PA-C

## 2020-09-21 ENCOUNTER — Other Ambulatory Visit: Payer: Self-pay

## 2020-09-21 ENCOUNTER — Ambulatory Visit: Payer: BC Managed Care – PPO | Admitting: Family Medicine

## 2020-09-21 ENCOUNTER — Encounter: Payer: Self-pay | Admitting: Family Medicine

## 2020-09-21 VITALS — BP 112/76 | HR 99 | Temp 99.0°F | Ht 67.0 in | Wt 199.1 lb

## 2020-09-21 DIAGNOSIS — Z0000 Encounter for general adult medical examination without abnormal findings: Secondary | ICD-10-CM | POA: Diagnosis not present

## 2020-09-21 NOTE — Patient Instructions (Signed)
Give us 2-3 business days to get the results of your labs back.   Keep the diet clean and stay active.  I recommend getting the flu shot in mid October. This suggestion would change if the CDC comes out with a different recommendation.   Let us know if you need anything. 

## 2020-09-21 NOTE — Progress Notes (Signed)
Chief Complaint  Patient presents with   New Patient (Initial Visit)     Well Woman Erika Garrett is here for a complete physical.  Transferring from Dr. Rogers Blocker.  Her last physical was >1 year ago.  Current diet: in general, a "healthy" diet. Current exercise: Peloton, some wt resistance. Weight is stable and she denies fatigue out of ordinary. Seatbelt? Yes  Health Maintenance Mammogram- Yes Tetanus- Yes Hep C screening- Yes HIV screening- Yes  Past Medical History:  Diagnosis Date   Allergy    Anxiety    Back pain    Bilateral swelling of feet    Constipation    Depression    Elevated liver enzymes    Endometriosis    Hyperlipidemia    hx of, no medications   Hypertension 2016   Intercranial Hypertension   Intracranial hypertension    Joint pain    Other fatigue    Pseudotumor cerebri induced by OCPs 05/22/2017   Shortness of breath on exertion    Subclinical hypothyroidism    Ventral hernia    Vitamin D deficiency      Past Surgical History:  Procedure Laterality Date   ABLATION ON ENDOMETRIOSIS     ROBOTIC ASSISTED LAPAROSCOPIC PARTIAL CYSTECTOMY      Medications  Current Outpatient Medications on File Prior to Visit  Medication Sig Dispense Refill   Ascorbic Acid (VITAMIN C) 1000 MG tablet Take 1,000 mg by mouth daily.     calcium carbonate (TUMS - DOSED IN MG ELEMENTAL CALCIUM) 500 MG chewable tablet Chew 1 tablet by mouth as needed for indigestion or heartburn.     Cetirizine HCl (ZYRTEC PO) Take 1 tablet by mouth daily.     Multiple Vitamins-Minerals (MULTIVITAMIN PO) Take 1 tablet by mouth daily.     OVER THE COUNTER MEDICATION Take 1 each by mouth daily. Take 1 Zinc gummy by mouth once daily.     Vitamin D, Cholecalciferol, 25 MCG (1000 UT) TABS Take 2 tablets by mouth daily.     Allergies Allergies  Allergen Reactions   Estrogens     BIH   Zithromax [Azithromycin] Hives   Levonorgestrel-Ethinyl Estrad Other (See Comments)    Review of  Systems: Constitutional:  no unexpected weight changes Eye:  no recent significant change in vision Ear/Nose/Mouth/Throat:  Ears:  no recent change in hearing Nose/Mouth/Throat:  no complaints of nasal congestion, no sore throat Cardiovascular: no chest pain Respiratory:  no shortness of breath Gastrointestinal:  no abdominal pain, no change in bowel habits GU:  Female: negative for dysuria or pelvic pain Musculoskeletal/Extremities:  no pain of the joints Integumentary (Skin/Breast):  no abnormal skin lesions reported Neurologic:  no headaches Endocrine:  denies fatigue Hematologic/Lymphatic:  No areas of easy bleeding  Exam BP 112/76   Pulse 99   Temp 99 F (37.2 C) (Oral)   Ht '5\' 7"'$  (1.702 m)   Wt 199 lb 2 oz (90.3 kg)   LMP 11/27/2019   SpO2 99%   BMI 31.19 kg/m  General:  well developed, well nourished, in no apparent distress Skin:  no significant moles, warts, or growths Head:  no masses, lesions, or tenderness Eyes:  pupils equal and round, sclera anicteric without injection Ears:  canals without lesions, TMs shiny without retraction, no obvious effusion, no erythema Nose:  nares patent, septum midline, mucosa normal, and no drainage or sinus tenderness Throat/Pharynx:  lips and gingiva without lesion; tongue and uvula midline; non-inflamed pharynx; no exudates or postnasal drainage  Neck: neck supple without adenopathy, thyromegaly, or masses Lungs:  clear to auscultation, breath sounds equal bilaterally, no respiratory distress Cardio:  regular rate and rhythm, no LE edema Abdomen:  abdomen soft, nontender; bowel sounds normal; no masses or organomegaly Genital: Defer to GYN Musculoskeletal:  symmetrical muscle groups noted without atrophy or deformity Extremities:  no clubbing, cyanosis, or edema, no deformities, no skin discoloration Neuro:  gait normal; deep tendon reflexes normal and symmetric Psych: well oriented with normal range of affect and appropriate  judgment/insight  Assessment and Plan  Well adult exam - Plan: CBC, Comprehensive metabolic panel, Lipid panel, VITAMIN D 25 Hydroxy (Vit-D Deficiency, Fractures), B12   Well 40 y.o. female. Counseled on diet and exercise. Other orders as above. Flu shot rec'd for mid Oct. Follow up in 1 yr or prn. The patient voiced understanding and agreement to the plan.  [place], DO 09/21/20 1:58 PM

## 2020-09-23 ENCOUNTER — Other Ambulatory Visit (INDEPENDENT_AMBULATORY_CARE_PROVIDER_SITE_OTHER): Payer: BC Managed Care – PPO

## 2020-09-23 ENCOUNTER — Other Ambulatory Visit: Payer: Self-pay

## 2020-09-23 ENCOUNTER — Other Ambulatory Visit: Payer: Self-pay | Admitting: Family Medicine

## 2020-09-23 DIAGNOSIS — Z0000 Encounter for general adult medical examination without abnormal findings: Secondary | ICD-10-CM | POA: Diagnosis not present

## 2020-09-23 DIAGNOSIS — E782 Mixed hyperlipidemia: Secondary | ICD-10-CM

## 2020-09-23 LAB — CBC
HCT: 42.8 % (ref 36.0–46.0)
Hemoglobin: 14.5 g/dL (ref 12.0–15.0)
MCHC: 33.8 g/dL (ref 30.0–36.0)
MCV: 91.6 fl (ref 78.0–100.0)
Platelets: 230 10*3/uL (ref 150.0–400.0)
RBC: 4.68 Mil/uL (ref 3.87–5.11)
RDW: 12.7 % (ref 11.5–15.5)
WBC: 9.3 10*3/uL (ref 4.0–10.5)

## 2020-09-23 LAB — LIPID PANEL
Cholesterol: 200 mg/dL (ref 0–200)
HDL: 33.3 mg/dL — ABNORMAL LOW (ref >39.00)
LDL Cholesterol: 140 mg/dL — ABNORMAL HIGH (ref 0–99)
NonHDL: 166.4
Total CHOL/HDL Ratio: 6
Triglycerides: 134 mg/dL (ref 0.0–149.0)
VLDL: 26.8 mg/dL (ref 0.0–40.0)

## 2020-09-23 LAB — COMPREHENSIVE METABOLIC PANEL
ALT: 27 U/L (ref 0–35)
AST: 18 U/L (ref 0–37)
Albumin: 4.3 g/dL (ref 3.5–5.2)
Alkaline Phosphatase: 72 U/L (ref 39–117)
BUN: 25 mg/dL — ABNORMAL HIGH (ref 6–23)
CO2: 26 mEq/L (ref 19–32)
Calcium: 9.4 mg/dL (ref 8.4–10.5)
Chloride: 106 mEq/L (ref 96–112)
Creatinine, Ser: 0.67 mg/dL (ref 0.40–1.20)
GFR: 108.92 mL/min (ref >60.00)
Glucose, Bld: 101 mg/dL — ABNORMAL HIGH (ref 70–99)
Potassium: 4.3 mEq/L (ref 3.5–5.1)
Sodium: 139 mEq/L (ref 135–145)
Total Bilirubin: 0.5 mg/dL (ref 0.2–1.2)
Total Protein: 6.4 g/dL (ref 6.0–8.3)

## 2020-09-23 LAB — VITAMIN D 25 HYDROXY (VIT D DEFICIENCY, FRACTURES): VITD: 29.76 ng/mL — ABNORMAL LOW (ref 30.00–100.00)

## 2020-09-23 LAB — VITAMIN B12: Vitamin B-12: 397 pg/mL (ref 211–911)

## 2020-10-06 DIAGNOSIS — H5203 Hypermetropia, bilateral: Secondary | ICD-10-CM | POA: Diagnosis not present

## 2020-10-06 DIAGNOSIS — G932 Benign intracranial hypertension: Secondary | ICD-10-CM | POA: Diagnosis not present

## 2020-10-10 ENCOUNTER — Ambulatory Visit (INDEPENDENT_AMBULATORY_CARE_PROVIDER_SITE_OTHER): Payer: BC Managed Care – PPO | Admitting: Family Medicine

## 2020-10-10 ENCOUNTER — Telehealth: Payer: Self-pay | Admitting: Neurology

## 2020-10-10 ENCOUNTER — Other Ambulatory Visit: Payer: Self-pay

## 2020-10-10 ENCOUNTER — Ambulatory Visit: Payer: BC Managed Care – PPO | Admitting: Family Medicine

## 2020-10-10 ENCOUNTER — Telehealth: Payer: Self-pay

## 2020-10-10 ENCOUNTER — Encounter (INDEPENDENT_AMBULATORY_CARE_PROVIDER_SITE_OTHER): Payer: Self-pay | Admitting: Family Medicine

## 2020-10-10 VITALS — BP 117/79 | HR 67 | Temp 97.7°F | Ht 67.0 in | Wt 197.0 lb

## 2020-10-10 DIAGNOSIS — Z6831 Body mass index (BMI) 31.0-31.9, adult: Secondary | ICD-10-CM

## 2020-10-10 DIAGNOSIS — E559 Vitamin D deficiency, unspecified: Secondary | ICD-10-CM

## 2020-10-10 DIAGNOSIS — Z9189 Other specified personal risk factors, not elsewhere classified: Secondary | ICD-10-CM

## 2020-10-10 DIAGNOSIS — E669 Obesity, unspecified: Secondary | ICD-10-CM | POA: Diagnosis not present

## 2020-10-10 DIAGNOSIS — E782 Mixed hyperlipidemia: Secondary | ICD-10-CM | POA: Diagnosis not present

## 2020-10-10 DIAGNOSIS — E8881 Metabolic syndrome: Secondary | ICD-10-CM

## 2020-10-10 MED ORDER — VITAMIN D (ERGOCALCIFEROL) 1.25 MG (50000 UNIT) PO CAPS
50000.0000 [IU] | ORAL_CAPSULE | ORAL | 0 refills | Status: DC
Start: 2020-10-10 — End: 2020-11-07

## 2020-10-10 NOTE — Telephone Encounter (Signed)
[facility] - Client Client Site [facility] - Night Physician Riki Sheer- MD Contact Type Call Who Is Calling Patient / Member / Family / Caregiver Caller Name [place] Phone Number [phone] Patient Name Erika Garrett Patient DOB 1981-01-13 Call Type Message Only Information Provided Reason for Call Request to Reschedule Office Appointment Initial Comment Caller states she is needing to reschedule her appointment today and move it to tomorrow Patient request to speak to RN No Additional Comment Provided office hours ; any time tomorrow will be fine per pt Disp. Time Disposition Final User 10/10/2020 7:54:52 AM General Information Provided Yes Vinnie Level Call Closed By: Vinnie Level Transaction Date/Time: 10/10/2020 7:52:15 AM (ET)

## 2020-10-10 NOTE — Telephone Encounter (Signed)
Called left message to call back 

## 2020-10-10 NOTE — Progress Notes (Signed)
Chief Complaint:   OBESITY Erika Garrett is here to discuss her progress with her obesity treatment plan along with follow-up of her obesity related diagnoses. Erika Garrett is on following a lower carbohydrate, vegetable and lean protein rich diet plan and states she is following her eating plan approximately 90% of the time. Erika Garrett states she is doing 0 minutes 0 times per week.  Today's visit was #: 8 Starting weight: 200 lbs Starting date: 03/17/2020 Today's weight: 197 lbs Today's date: 10/10/2020 Total lbs lost to date: 3 Total lbs lost since last in-office visit: 0  Interim History: Erika Garrett is retaining some fluid today. She did some celebration eating the weekend of her birthday, but she has been doing well otherwise. She is open to looking at other meal plan.  Subjective:   1. Mixed hyperlipidemia Erika Garrett has been working on diet and exercise, and her triglycerides have greatly improved and LDL is slightly better. She is not on statin.   2. Vitamin D deficiency Erika Garrett is on Vit D OTC, and her level is still below goal.  3. Insulin resistance Erika Garrett has a mild level of insulin resistance, and she is working on decreasing simple carbohydrates.  4. At risk for heart disease Erika Garrett is at a higher than average risk for cardiovascular disease due to obesity.   Assessment/Plan:   1. Mixed hyperlipidemia Cardiovascular risk and specific lipid/LDL goals reviewed. We discussed several lifestyle modifications today. Erika Garrett will continue to work on diet, exercise and weight loss efforts, and will continue to follow up as directed. Orders and follow up as documented in patient record.   2. Vitamin D deficiency Low Vitamin D level contributes to fatigue and are associated with obesity, breast, and colon cancer. Erika Garrett will continue Vitamin D OTC 2,000 IU daily, and she will start prescription Vitamin D 50,000 IU every week with no refills. She will follow-up for routine testing of Vitamin D, at least 2-3 times per  year to avoid over-replacement.  - Vitamin D, Ergocalciferol, (DRISDOL) 1.25 MG (50000 UNIT) CAPS capsule; Take 1 capsule (50,000 Units total) by mouth every 7 (seven) days.  Dispense: 4 capsule; Refill: 0  3. Insulin resistance Erika Garrett will continue to work on weight loss, exercise, and decreasing simple carbohydrates to help decrease the risk of diabetes. We will check labs today. Erika Garrett agreed to follow-up with [name] as directed to closely monitor her progress.  - CMP14+EGFR - Hemoglobin A1c - Insulin, random  4. At risk for heart disease Erika Garrett was given approximately 15 minutes of coronary artery disease prevention counseling today. She is 40 y.o. female and has risk factors for heart disease including obesity. We discussed intensive lifestyle modifications today with an emphasis on specific weight loss instructions and strategies.   Repetitive spaced learning was employed today to elicit superior memory formation and behavioral change.  5. Obesity with current BMI 30.9 Erika Garrett is currently in the action stage of change. As such, her goal is to continue with weight loss efforts. She has agreed to the Category 2 Plan or the [place].   Behavioral modification strategies: increasing lean protein intake.  Erika Garrett has agreed to follow-up with our clinic in 3 to 4 weeks. She was informed of the importance of frequent follow-up visits to maximize her success with intensive lifestyle modifications for her multiple health conditions.   Erika Garrett was informed we would discuss her lab results at her next visit unless there is a critical issue that needs to be addressed sooner. Erika Garrett  agreed to keep her next visit at the agreed upon time to discuss these results.  Objective:   Blood pressure 117/79, pulse 67, temperature 97.7 F (36.5 C), height _0  (1.702 m), weight 197 lb (89.4 kg), last menstrual period 11/27/2019, SpO2 100 %. Body mass index is 30.85 kg/m.  General: Cooperative, alert, well  developed, in no acute distress. HEENT: Conjunctivae and lids unremarkable. Cardiovascular: Regular rhythm.  Lungs: Normal work of breathing. Neurologic: No focal deficits.   Lab Results  Component Value Date   CREATININE 0.67 09/23/2020   BUN 25 (H) 09/23/2020   NA 139 09/23/2020   K 4.3 09/23/2020   CL 106 09/23/2020   CO2 26 09/23/2020   Lab Results  Component Value Date   ALT 27 09/23/2020   AST 18 09/23/2020   ALKPHOS 72 09/23/2020   BILITOT 0.5 09/23/2020   Lab Results  Component Value Date   HGBA1C 5.2 03/17/2020   Lab Results  Component Value Date   INSULIN 6.4 03/17/2020   Lab Results  Component Value Date   TSH 2.38 02/01/2020   Lab Results  Component Value Date   CHOL 200 09/23/2020   HDL 33.30 (L) 09/23/2020   LDLCALC 140 (H) 09/23/2020   LDLDIRECT 135.0 09/17/2018   TRIG 134.0 09/23/2020   CHOLHDL 6 09/23/2020   Lab Results  Component Value Date   VD25OH 29.76 (L) 09/23/2020   VD25OH 33.06 02/01/2020   VD25OH 37.29 07/11/2017   Lab Results  Component Value Date   WBC 9.3 09/23/2020   HGB 14.5 09/23/2020   HCT 42.8 09/23/2020   MCV 91.6 09/23/2020   PLT 230.0 09/23/2020   No results found for: IRON, TIBC, FERRITIN  Attestation Statements:   Reviewed by clinician on day of visit: allergies, medications, problem list, medical history, surgical history, family history, social history, and previous encounter notes.   I, Trixie Dredge, am acting as transcriptionist for Dennard Nip, MD.  I have reviewed the above documentation for accuracy and completeness, and I agree with the above. -  Dennard Nip, MD

## 2020-10-10 NOTE — Telephone Encounter (Signed)
Appointment has been rescheduled.

## 2020-10-10 NOTE — Telephone Encounter (Signed)
The patient was recently seen on 06 October 2020, no papilledema was seen.  Examination was done by Dr. Jola Schmidt.

## 2020-10-11 ENCOUNTER — Ambulatory Visit: Payer: BC Managed Care – PPO | Admitting: Family Medicine

## 2020-10-11 LAB — CMP14+EGFR
ALT: 45 IU/L — ABNORMAL HIGH (ref 0–32)
AST: 28 IU/L (ref 0–40)
Albumin/Globulin Ratio: 2.3 — ABNORMAL HIGH (ref 1.2–2.2)
Albumin: 4.5 g/dL (ref 3.8–4.8)
Alkaline Phosphatase: 83 IU/L (ref 44–121)
BUN/Creatinine Ratio: 28 — ABNORMAL HIGH (ref 9–23)
BUN: 18 mg/dL (ref 6–24)
Bilirubin Total: 0.3 mg/dL (ref 0.0–1.2)
CO2: 21 mmol/L (ref 20–29)
Calcium: 9.1 mg/dL (ref 8.7–10.2)
Chloride: 106 mmol/L (ref 96–106)
Creatinine, Ser: 0.65 mg/dL (ref 0.57–1.00)
Globulin, Total: 2 g/dL (ref 1.5–4.5)
Glucose: 91 mg/dL (ref 65–99)
Potassium: 4.5 mmol/L (ref 3.5–5.2)
Sodium: 141 mmol/L (ref 134–144)
Total Protein: 6.5 g/dL (ref 6.0–8.5)
eGFR: 113 mL/min/{1.73_m2} (ref >59)

## 2020-10-11 LAB — HEMOGLOBIN A1C
Est. average glucose Bld gHb Est-mCnc: 103 mg/dL
Hgb A1c MFr Bld: 5.2 % (ref 4.8–5.6)

## 2020-10-11 LAB — INSULIN, RANDOM: INSULIN: 10.6 u[IU]/mL (ref 2.6–24.9)

## 2020-11-04 ENCOUNTER — Other Ambulatory Visit (INDEPENDENT_AMBULATORY_CARE_PROVIDER_SITE_OTHER): Payer: BC Managed Care – PPO

## 2020-11-04 ENCOUNTER — Other Ambulatory Visit: Payer: Self-pay

## 2020-11-04 DIAGNOSIS — E782 Mixed hyperlipidemia: Secondary | ICD-10-CM | POA: Diagnosis not present

## 2020-11-04 LAB — LIPID PANEL
Cholesterol: 179 mg/dL (ref 0–200)
HDL: 34.5 mg/dL — ABNORMAL LOW (ref >39.00)
LDL Cholesterol: 112 mg/dL — ABNORMAL HIGH (ref 0–99)
NonHDL: 144.15
Total CHOL/HDL Ratio: 5
Triglycerides: 160 mg/dL — ABNORMAL HIGH (ref 0.0–149.0)
VLDL: 32 mg/dL (ref 0.0–40.0)

## 2020-11-07 ENCOUNTER — Ambulatory Visit (INDEPENDENT_AMBULATORY_CARE_PROVIDER_SITE_OTHER): Payer: BC Managed Care – PPO | Admitting: Family Medicine

## 2020-11-07 ENCOUNTER — Encounter (INDEPENDENT_AMBULATORY_CARE_PROVIDER_SITE_OTHER): Payer: Self-pay | Admitting: Family Medicine

## 2020-11-07 ENCOUNTER — Other Ambulatory Visit: Payer: Self-pay

## 2020-11-07 VITALS — BP 120/70 | HR 88 | Temp 98.0°F | Ht 67.0 in | Wt 192.0 lb

## 2020-11-07 DIAGNOSIS — E669 Obesity, unspecified: Secondary | ICD-10-CM | POA: Diagnosis not present

## 2020-11-07 DIAGNOSIS — E559 Vitamin D deficiency, unspecified: Secondary | ICD-10-CM

## 2020-11-07 DIAGNOSIS — Z6831 Body mass index (BMI) 31.0-31.9, adult: Secondary | ICD-10-CM | POA: Diagnosis not present

## 2020-11-07 MED ORDER — VITAMIN D (ERGOCALCIFEROL) 1.25 MG (50000 UNIT) PO CAPS: 50000.0000 [IU] | ORAL_CAPSULE | ORAL | 0 refills | Status: DC

## 2020-11-07 NOTE — Progress Notes (Signed)
Chief Complaint:   OBESITY Erika Garrett is here to discuss her progress with her obesity treatment plan along with follow-up of her obesity related diagnoses. Erika Garrett is on the Category 2 Plan or the [place] and states she is following her eating plan approximately 99% of the time. Jocilynn states she is walking for 30-45 minutes 3 times per week.  Today's visit was #: 9 Starting weight: 200 lbs Starting date: 03/17/2020 Today's weight: 192 lbs Today's date: 11/07/2020 Total lbs lost to date: 8 Total lbs lost since last in-office visit: 5  Interim History: [name] has done better with weight loss. She is journaling and has really increased her water. She is much more mindful of her food choices, but she is struggling to work in her protein.  Subjective:   1. Vitamin D deficiency Erika Garrett is on Vit D, and she feels it already has decreased fatigue. Her level is not yet at goal.  Assessment/Plan:   1. Vitamin D deficiency Low Vitamin D level contributes to fatigue and are associated with obesity, breast, and colon cancer. We will refill prescription Vitamin D 50,000 IU every week #4 for 1 month. Erika Garrett will follow-up for routine testing of Vitamin D, at least 2-3 times per year to avoid over-replacement.  2. Obesity with current BMI 30.1 [place] is currently in the action stage of change. As such, her goal is to continue with weight loss efforts. She has agreed to the Category 2 Plan.   Exercise goals: As is.  Behavioral modification strategies: increasing lean protein intake and meal planning and cooking strategies.  Erika Garrett has agreed to follow-up with our clinic in 4 weeks. She was informed of the importance of frequent follow-up visits to maximize her success with intensive lifestyle modifications for her multiple health conditions.   Objective:   Blood pressure 120/70, pulse 88, temperature 98 F (36.7 C), height 5\' 7"  (1.702 m), weight 192 lb (87.1 kg), last menstrual period 11/27/2019,  SpO2 99 %. Body mass index is 30.07 kg/m.  General: Cooperative, alert, well developed, in no acute distress. HEENT: Conjunctivae and lids unremarkable. Cardiovascular: Regular rhythm.  Lungs: Normal work of breathing. Neurologic: No focal deficits.   Lab Results  Component Value Date   CREATININE 0.65 10/10/2020   BUN 18 10/10/2020   NA 141 10/10/2020   K 4.5 10/10/2020   CL 106 10/10/2020   CO2 21 10/10/2020   Lab Results  Component Value Date   ALT 45 (H) 10/10/2020   AST 28 10/10/2020   ALKPHOS 83 10/10/2020   BILITOT 0.3 10/10/2020   Lab Results  Component Value Date   HGBA1C 5.2 10/10/2020   HGBA1C 5.2 03/17/2020   Lab Results  Component Value Date   INSULIN 10.6 10/10/2020   INSULIN 6.4 03/17/2020   Lab Results  Component Value Date   TSH 2.38 02/01/2020   Lab Results  Component Value Date   CHOL 179 11/04/2020   HDL 34.50 (L) 11/04/2020   LDLCALC 112 (H) 11/04/2020   LDLDIRECT 135.0 09/17/2018   TRIG 160.0 (H) 11/04/2020   CHOLHDL 5 11/04/2020   Lab Results  Component Value Date   VD25OH 29.76 (L) 09/23/2020   VD25OH 33.06 02/01/2020   VD25OH 37.29 07/11/2017   Lab Results  Component Value Date   WBC 9.3 09/23/2020   HGB 14.5 09/23/2020   HCT 42.8 09/23/2020   MCV 91.6 09/23/2020   PLT 230.0 09/23/2020   No results found for: IRON, TIBC, FERRITIN  Attestation Statements:   Reviewed by clinician on day of visit: allergies, medications, problem list, medical history, surgical history, family history, social history, and previous encounter notes.  Time spent on visit including pre-visit chart review and post-visit care and charting was 30 minutes.    I, Trixie Dredge, am acting as transcriptionist for Dennard Nip, MD.  I have reviewed the above documentation for accuracy and completeness, and I agree with the above. -  Dennard Nip, MD

## 2020-12-07 ENCOUNTER — Ambulatory Visit (INDEPENDENT_AMBULATORY_CARE_PROVIDER_SITE_OTHER): Payer: BC Managed Care – PPO | Admitting: Family Medicine

## 2020-12-22 ENCOUNTER — Ambulatory Visit (INDEPENDENT_AMBULATORY_CARE_PROVIDER_SITE_OTHER): Payer: BC Managed Care – PPO | Admitting: Family Medicine

## 2020-12-22 ENCOUNTER — Telehealth (INDEPENDENT_AMBULATORY_CARE_PROVIDER_SITE_OTHER): Payer: BC Managed Care – PPO | Admitting: Family Medicine

## 2020-12-22 ENCOUNTER — Other Ambulatory Visit: Payer: Self-pay

## 2020-12-22 ENCOUNTER — Encounter: Payer: Self-pay | Admitting: Family Medicine

## 2020-12-22 VITALS — BP 116/68 | HR 64 | Temp 98.3°F | Ht 67.0 in

## 2020-12-22 DIAGNOSIS — R6889 Other general symptoms and signs: Secondary | ICD-10-CM | POA: Diagnosis not present

## 2020-12-22 DIAGNOSIS — R051 Acute cough: Secondary | ICD-10-CM | POA: Diagnosis not present

## 2020-12-22 MED ORDER — GUAIFENESIN-CODEINE 100-10 MG/5ML PO SYRP: 5.0000 mL | ORAL_SOLUTION | Freq: Three times a day (TID) | ORAL | 0 refills | Status: DC | PRN

## 2020-12-22 MED ORDER — DOXYCYCLINE HYCLATE 100 MG PO TABS: 100.0000 mg | ORAL_TABLET | Freq: Two times a day (BID) | ORAL | 0 refills | Status: DC

## 2020-12-22 MED ORDER — BENZONATATE 200 MG PO CAPS: 200.0000 mg | ORAL_CAPSULE | Freq: Three times a day (TID) | ORAL | 1 refills | Status: DC | PRN

## 2020-12-22 NOTE — Progress Notes (Signed)
Erika Garrett T. Jonel Weldon, MD Primary Care and Sports Medicine [facility] at [facility] Erika Garrett, [id number] Phone: [phone]  FAX: [place] - 40 y.o. female  MRN [id number]  Date of Birth: 1980/12/11  Visit Date: 12/22/2020  PCP: Shelda Pal, DO  Referred by: Shelda Pal*  Virtual Visit via Video Note:  I connected with  Tien Aispuro on 12/22/2020  3:20 PM EST by a video enabled telemedicine application and verified that I am speaking with the correct person using two identifiers.   Location patient: home computer, tablet, or smartphone Location provider: work or home office Consent: Verbal consent directly obtained from [facility]. Persons participating in the virtual visit: patient, provider  I discussed the limitations of evaluation and management by telemedicine and the availability of in person appointments. The patient expressed understanding and agreed to proceed.  Chief Complaint  Patient presents with   Sore Throat    k2 negative home Covid Test Monday and Today-Symptoms started Monday night   Cough    With green phlegm-worse at night   Fever    Fever all week but none today    History of Present Illness:  Patient presents with getting ill on Monday afternoon, when she got a fever.  She started to have a sore throat and some discomfort in her chest and coughing.  She did initially have a fever, but at this point this is gone.  She has had a cough at night, and last night she did cough to the point where she had difficulty sleeping.  Right now her chest and ribs are sore with coughing.  She did take a COVID test on Monday as well as today, and these were both negative.  She does work in [employer], and while she did travel to see family over Thanksgiving, she did drive to [place] and back.  And she did not fly.  On Monday afternoon, got a fever.  Symptoms with a sore throat  and in her chest.  Fever is gone now.  At night, will cough.   Smoked for on and off for 10 years.   Mucinex and delsym.  Review of Systems as above: See pertinent positives and pertinent negatives per HPI No acute distress verbally   Observations/Objective/Exam:  An attempt was made to discern vital signs over the phone and per patient if applicable and possible.   General:    Alert, Oriented, appears well and in no acute distress  Pulmonary:     On inspection no signs of respiratory distress.  Psych / Neurological:     Pleasant and cooperative.  Assessment and Plan:    ICD-10-CM   1. Flu-like symptoms  R68.89     2. Acute cough  R05.1      Fever, upper respiratory as well as upper lower respiratory symptoms with general fatigue and productive cough.  We can feel very comfortable if she does not have COVID-19.  Certainly, influenza would be very high in likelihood.  Other viral syndrome or flulike illness would be possible as well.  For now, continue supportive care, Tessalon during the day and Robitussin-AC at night for severe coughing.  Gave her some doxycycline to hold.  If she continues to improve, I do not think that there is any real need to take any antibiotics, but if she worsens I think that is entirely reasonable.  I discussed the assessment and treatment plan  with the patient. The patient was provided an opportunity to ask questions and all were answered. The patient agreed with the plan and demonstrated an understanding of the instructions.   The patient was advised to call back or seek an in-person evaluation if the symptoms worsen or if the condition fails to improve as anticipated.  Follow-up: prn unless noted otherwise below No follow-ups on file.  Meds ordered this encounter  Medications   benzonatate (TESSALON) 200 MG capsule    Sig: Take 1 capsule (200 mg total) by mouth 3 (three) times daily as needed for cough.    Dispense:  40 capsule     Refill:  1   doxycycline (VIBRA-TABS) 100 MG tablet    Sig: Take 1 tablet (100 mg total) by mouth 2 (two) times daily.    Dispense:  20 tablet    Refill:  0   guaiFENesin-codeine (ROBITUSSIN AC) 100-10 MG/5ML syrup    Sig: Take 5 mLs by mouth 3 (three) times daily as needed for cough.    Dispense:  120 mL    Refill:  0   No orders of the defined types were placed in this encounter.   Signed,  Maud Deed. Johnson [place], MD

## 2020-12-29 ENCOUNTER — Ambulatory Visit (INDEPENDENT_AMBULATORY_CARE_PROVIDER_SITE_OTHER): Payer: BC Managed Care – PPO | Admitting: Family Medicine

## 2021-01-04 ENCOUNTER — Encounter (INDEPENDENT_AMBULATORY_CARE_PROVIDER_SITE_OTHER): Payer: Self-pay | Admitting: Family Medicine

## 2021-01-04 ENCOUNTER — Ambulatory Visit (INDEPENDENT_AMBULATORY_CARE_PROVIDER_SITE_OTHER): Payer: BC Managed Care – PPO | Admitting: Family Medicine

## 2021-01-04 ENCOUNTER — Other Ambulatory Visit: Payer: Self-pay

## 2021-01-04 VITALS — BP 119/81 | HR 110 | Temp 99.4°F | Ht 67.0 in | Wt 194.0 lb

## 2021-01-04 DIAGNOSIS — E669 Obesity, unspecified: Secondary | ICD-10-CM | POA: Diagnosis not present

## 2021-01-04 DIAGNOSIS — E559 Vitamin D deficiency, unspecified: Secondary | ICD-10-CM

## 2021-01-04 DIAGNOSIS — Z6831 Body mass index (BMI) 31.0-31.9, adult: Secondary | ICD-10-CM

## 2021-01-04 MED ORDER — VITAMIN D (ERGOCALCIFEROL) 1.25 MG (50000 UNIT) PO CAPS: 50000.0000 [IU] | ORAL_CAPSULE | ORAL | 1 refills | Status: DC

## 2021-01-04 NOTE — Progress Notes (Signed)
Chief Complaint:   OBESITY Erika Garrett is here to discuss her progress with her obesity treatment plan along with follow-up of her obesity related diagnoses. Erika Garrett is on the Category 2 Plan and states she is following her eating plan approximately 40% of the time. Erika Garrett states she is doing 0 minutes 0 times per week.  Today's visit was #: 10 Starting weight: 200 lbs Starting date: 03/17/2020 Today's weight: 194 lbs Today's date: 01/04/2021 Total lbs lost to date: 6 Total lbs lost since last in-office visit: 0  Interim History: Erika Garrett has been sick since her last visit. She hasn't been able to follow her plan closely, but she is ready to get back on track. She would like to consider using some pre-packaged foods.  Subjective:   1. Vitamin D deficiency Erika Garrett's Vitamin D level is not yet controlled. She is on Vit D prescription and OTC Vit D.  Assessment/Plan:   1. Vitamin D deficiency We will refill prescription Vitamin D for 2 months. We will recheck labs at her next visit, and Erika Garrett will follow-up for routine testing of Vitamin D, at least 2-3 times per year to avoid over-replacement.  - Vitamin D, Ergocalciferol, (DRISDOL) 1.25 MG (50000 UNIT) CAPS capsule; Take 1 capsule (50,000 Units total) by mouth every 7 (seven) days.  Dispense: 4 capsule; Refill: 1  2. Obesity BMI today is 29 Erika Garrett is currently in the action stage of change. As such, her goal is to continue with weight loss efforts. She has agreed to change to keeping a food journal and adhering to recommended goals of 1200-1500 calories and 85+ grams of protein daily.   Erika Garrett was changed to journal, and [employer] products were recommended.  Behavioral modification strategies: increasing lean protein intake and meal planning and cooking strategies.  Erika Garrett has agreed to follow-up with our clinic in 4 to 6 weeks. She was informed of the importance of frequent follow-up visits to maximize her success with intensive lifestyle  modifications for her multiple health conditions.   Objective:   Blood pressure 119/81, pulse (!) 110, temperature 99.4 F (37.4 C), height 5\' 7"  (1.702 m), weight 194 lb (88 kg), last menstrual period 11/27/2019, SpO2 99 %. Body mass index is 30.38 kg/m.  General: Cooperative, alert, well developed, in no acute distress. HEENT: Conjunctivae and lids unremarkable. Cardiovascular: Regular rhythm.  Lungs: Normal work of breathing. Neurologic: No focal deficits.   Lab Results  Component Value Date   CREATININE 0.65 10/10/2020   BUN 18 10/10/2020   NA 141 10/10/2020   K 4.5 10/10/2020   CL 106 10/10/2020   CO2 21 10/10/2020   Lab Results  Component Value Date   ALT 45 (H) 10/10/2020   AST 28 10/10/2020   ALKPHOS 83 10/10/2020   BILITOT 0.3 10/10/2020   Lab Results  Component Value Date   HGBA1C 5.2 10/10/2020   HGBA1C 5.2 03/17/2020   Lab Results  Component Value Date   INSULIN 10.6 10/10/2020   INSULIN 6.4 03/17/2020   Lab Results  Component Value Date   TSH 2.38 02/01/2020   Lab Results  Component Value Date   CHOL 179 11/04/2020   HDL 34.50 (L) 11/04/2020   LDLCALC 112 (H) 11/04/2020   LDLDIRECT 135.0 09/17/2018   TRIG 160.0 (H) 11/04/2020   CHOLHDL 5 11/04/2020   Lab Results  Component Value Date   VD25OH 29.76 (L) 09/23/2020   VD25OH 33.06 02/01/2020   VD25OH 37.29 07/11/2017   Lab Results  Component  Value Date   WBC 9.3 09/23/2020   HGB 14.5 09/23/2020   HCT 42.8 09/23/2020   MCV 91.6 09/23/2020   PLT 230.0 09/23/2020   No results found for: IRON, TIBC, FERRITIN  Attestation Statements:   Reviewed by clinician on day of visit: allergies, medications, problem list, medical history, surgical history, family history, social history, and previous encounter notes.  Time spent on visit including pre-visit chart review and post-visit care and charting was 32 minutes.    I, Trixie Dredge, am acting as transcriptionist for Dennard Nip, MD.  I  have reviewed the above documentation for accuracy and completeness, and I agree with the above. -  Dennard Nip, MD

## 2021-01-24 ENCOUNTER — Encounter: Payer: Self-pay | Admitting: Family Medicine

## 2021-01-24 ENCOUNTER — Ambulatory Visit: Payer: BC Managed Care – PPO | Admitting: Family Medicine

## 2021-01-24 VITALS — BP 114/70 | HR 102 | Temp 98.8°F | Ht 67.0 in | Wt 202.1 lb

## 2021-01-24 DIAGNOSIS — M542 Cervicalgia: Secondary | ICD-10-CM

## 2021-01-24 DIAGNOSIS — S46812A Strain of other muscles, fascia and tendons at shoulder and upper arm level, left arm, initial encounter: Secondary | ICD-10-CM | POA: Diagnosis not present

## 2021-01-24 MED ORDER — TIZANIDINE HCL 4 MG PO TABS: 4.0000 mg | ORAL_TABLET | Freq: Four times a day (QID) | ORAL | 0 refills | Status: DC | PRN

## 2021-01-24 MED ORDER — MELOXICAM 15 MG PO TABS: 15.0000 mg | ORAL_TABLET | Freq: Every day | ORAL | 0 refills | Status: DC

## 2021-01-24 NOTE — Progress Notes (Signed)
Musculoskeletal Exam  Patient: Erika Garrett DOB: 09/06/1980  DOS: 01/24/2021  SUBJECTIVE:  Chief Complaint:   Chief Complaint  Patient presents with   Shoulder Pain    left    Erika Garrett is a 41 y.o.  female for evaluation and treatment of L shoulder pain pain.   Onset:  6 months ago. No inj or change in activity.  Location: L shoulder Character:  aching  Progression of issue:  is unchanged Associated symptoms: tingling down arm/falling asleep sensation Denies bruising, redness, swelling Treatment: to date has been ice, home exercises, massage, and heat.   Neurovascular symptoms: no  Past Medical History:  Diagnosis Date   Allergy    Anxiety    Back pain    Bilateral swelling of feet    Constipation    Depression    Elevated liver enzymes    Endometriosis    Hyperlipidemia    hx of, no medications   Hypertension 2016   Intercranial Hypertension   Intracranial hypertension    Joint pain    Other fatigue    Pseudotumor cerebri induced by OCPs 05/22/2017   Shortness of breath on exertion    Subclinical hypothyroidism    Ventral hernia    Vitamin D deficiency     Objective: VITAL SIGNS: BP 114/70    Pulse (!) 102    Temp 98.8 F (37.1 C) (Oral)    Ht 5\' 7"  (1.702 m)    Wt 202 lb 2 oz (91.7 kg)    LMP 11/27/2019    SpO2 99%    BMI 31.66 kg/m  Constitutional: Well formed, well developed. No acute distress. Thorax & Lungs: No accessory muscle use Musculoskeletal: L shoulder.   Normal active range of motion: yes.   Normal passive range of motion: yes Tenderness to palpation: yes over lateral neck and trap Deformity: no Ecchymosis: no Tests positive: none Tests negative: Spurling's, Neer's, cross over, empty can, Speed's Neurologic: Normal sensory function. No focal deficits noted. DTR's equal and symmetric in UE's. No clonus. 5/5 strength throughout UE's.  Psychiatric: Normal mood. Age appropriate judgment and insight. Alert & oriented x 3.     Assessment:  Neck pain on left side - Plan: tiZANidine (ZANAFLEX) 4 MG tablet, meloxicam (MOBIC) 15 MG tablet  Strain of left trapezius muscle, initial encounter  Plan: Lateral neck, trap strain chronic, uncontrolled. Stretches/exercises, heat, ice, Tylenol. Mobic qd prn and Zanaflex prn as above. PT if no better in 3-4 weeks.  F/u prn. The patient voiced understanding and agreement to the plan.   [place], DO 01/24/21  9:36 AM

## 2021-01-24 NOTE — Patient Instructions (Addendum)
Heat (pad or rice pillow in microwave) over affected area, 10-15 minutes twice daily.   Ice/cold pack over area for 10-15 min twice daily.  OK to take Tylenol 1000 mg (2 extra strength tabs) or 975 mg (3 regular strength tabs) every 6 hours as needed.  Send me a message in 3-4 weeks if no better or sooner if worsening.   Let us know if you need anything.  EXERCISES RANGE OF MOTION (ROM) AND STRETCHING EXERCISES  These exercises may help you when beginning to rehabilitate your issue. In order to successfully resolve your symptoms, you must improve your posture. These exercises are designed to help reduce the forward-head and rounded-shoulder posture which contributes to this condition. Your symptoms may resolve with or without further involvement from your physician, physical therapist or athletic trainer. While completing these exercises, remember:  Restoring tissue flexibility helps normal motion to return to the joints. This allows healthier, less painful movement and activity. An effective stretch should be held for at least 20 seconds, although you may need to begin with shorter hold times for comfort. A stretch should never be painful. You should only feel a gentle lengthening or release in the stretched tissue. Do not do any stretch or exercise that you cannot tolerate.  STRETCH- Axial Extensors Lie on your back on the floor. You may bend your knees for comfort. Place a rolled-up hand towel or dish towel, about 2 inches in diameter, under the part of your head that makes contact with the floor. Gently tuck your chin, as if trying to make a "double chin," until you feel a gentle stretch at the base of your head. Hold 15-20 seconds. Repeat 2-3 times. Complete this exercise 1 time per day.   STRETCH - Axial Extension  Stand or sit on a firm surface. Assume a good posture: chest up, shoulders drawn back, abdominal muscles slightly tense, knees unlocked (if standing) and feet hip width  apart. Slowly retract your chin so your head slides back and your chin slightly lowers. Continue to look straight ahead. You should feel a gentle stretch in the back of your head. Be certain not to feel an aggressive stretch since this can cause headaches later. Hold for 15-20 seconds. Repeat 2-3 times. Complete this exercise 1 time per day.  STRETCH - Cervical Side Bend  Stand or sit on a firm surface. Assume a good posture: chest up, shoulders drawn back, abdominal muscles slightly tense, knees unlocked (if standing) and feet hip width apart. Without letting your nose or shoulders move, slowly tip your right / left ear to your shoulder until your feel a gentle stretch in the muscles on the opposite side of your neck. Hold 15-20 seconds. Repeat 2-3 times. Complete this exercise 1-2 times per day.  STRETCH - Cervical Rotators  Stand or sit on a firm surface. Assume a good posture: chest up, shoulders drawn back, abdominal muscles slightly tense, knees unlocked (if standing) and feet hip width apart. Keeping your eyes level with the ground, slowly turn your head until you feel a gentle stretch along the back and opposite side of your neck. Hold 15-20 seconds. Repeat 2-3 times. Complete this exercise 1-2 times per day.  RANGE OF MOTION - Neck Circles  Stand or sit on a firm surface. Assume a good posture: chest up, shoulders drawn back, abdominal muscles slightly tense, knees unlocked (if standing) and feet hip width apart. Gently roll your head down and around from the back of one shoulder to  the back of the other. The motion should never be forced or painful. Repeat the motion 10-20 times, or until you feel the neck muscles relax and loosen. Repeat 2-3 times. Complete the exercise 1-2 times per day. STRENGTHENING EXERCISES - Cervical Strain and Sprain These exercises may help you when beginning to rehabilitate your injury. They may resolve your symptoms with or without further involvement  from your physician, physical therapist, or athletic trainer. While completing these exercises, remember:  Muscles can gain both the endurance and the strength needed for everyday activities through controlled exercises. Complete these exercises as instructed by your physician, physical therapist, or athletic trainer. Progress the resistance and repetitions only as guided. You may experience muscle soreness or fatigue, but the pain or discomfort you are trying to eliminate should never worsen during these exercises. If this pain does worsen, stop and make certain you are following the directions exactly. If the pain is still present after adjustments, discontinue the exercise until you can discuss the trouble with your clinician.  STRENGTH - Cervical Flexors, Isometric Face a wall, standing about 6 inches away. Place a small pillow, a ball about 6-8 inches in diameter, or a folded towel between your forehead and the wall. Slightly tuck your chin and gently push your forehead into the soft object. Push only with mild to moderate intensity, building up tension gradually. Keep your jaw and forehead relaxed. Hold 10 to 20 seconds. Keep your breathing relaxed. Release the tension slowly. Relax your neck muscles completely before you start the next repetition. Repeat 2-3 times. Complete this exercise 1 time per day.  STRENGTH- Cervical Lateral Flexors, Isometric  Stand about 6 inches away from a wall. Place a small pillow, a ball about 6-8 inches in diameter, or a folded towel between the side of your head and the wall. Slightly tuck your chin and gently tilt your head into the soft object. Push only with mild to moderate intensity, building up tension gradually. Keep your jaw and forehead relaxed. Hold 10 to 20 seconds. Keep your breathing relaxed. Release the tension slowly. Relax your neck muscles completely before you start the next repetition. Repeat 2-3 times. Complete this exercise 1 time per  day.  STRENGTH - Cervical Extensors, Isometric  Stand about 6 inches away from a wall. Place a small pillow, a ball about 6-8 inches in diameter, or a folded towel between the back of your head and the wall. Slightly tuck your chin and gently tilt your head back into the soft object. Push only with mild to moderate intensity, building up tension gradually. Keep your jaw and forehead relaxed. Hold 10 to 20 seconds. Keep your breathing relaxed. Release the tension slowly. Relax your neck muscles completely before you start the next repetition. Repeat 2-3 times. Complete this exercise 1 time per day.  POSTURE AND BODY MECHANICS CONSIDERATIONS Keeping correct posture when sitting, standing or completing your activities will reduce the stress put on different body tissues, allowing injured tissues a chance to heal and limiting painful experiences. The following are general guidelines for improved posture. Your physician or physical therapist will provide you with any instructions specific to your needs. While reading these guidelines, remember: The exercises prescribed by your provider will help you have the flexibility and strength to maintain correct postures. The correct posture provides the optimal environment for your joints to work. All of your joints have less wear and tear when properly supported by a spine with good posture. This means you will experience  a healthier, less painful body. Correct posture must be practiced with all of your activities, especially prolonged sitting and standing. Correct posture is as important when doing repetitive low-stress activities (typing) as it is when doing a single heavy-load activity (lifting).  PROLONGED STANDING WHILE SLIGHTLY LEANING FORWARD When completing a task that requires you to lean forward while standing in one place for a long time, place either foot up on a stationary 2- to 4-inch high object to help maintain the best posture. When both feet are  on the ground, the low back tends to lose its slight inward curve. If this curve flattens (or becomes too large), then the back and your other joints will experience too much stress, fatigue more quickly, and can cause pain.   RESTING POSITIONS Consider which positions are most painful for you when choosing a resting position. If you have pain with flexion-based activities (sitting, bending, stooping, squatting), choose a position that allows you to rest in a less flexed posture. You would want to avoid curling into a fetal position on your side. If your pain worsens with extension-based activities (prolonged standing, working overhead), avoid resting in an extended position such as sleeping on your stomach. Most people will find more comfort when they rest with their spine in a more neutral position, neither too rounded nor too arched. Lying on a non-sagging bed on your side with a pillow between your knees, or on your back with a pillow under your knees will often provide some relief. Keep in mind, being in any one position for a prolonged period of time, no matter how correct your posture, can still lead to stiffness.  WALKING Walk with an upright posture. Your ears, shoulders, and hips should all line up. OFFICE WORK When working at a desk, create an environment that supports good, upright posture. Without extra support, muscles fatigue and lead to excessive strain on joints and other tissues.  CHAIR: A chair should be able to slide under your desk when your back makes contact with the back of the chair. This allows you to work closely. The chair's height should allow your eyes to be level with the upper part of your monitor and your hands to be slightly lower than your elbows. Body position: Your feet should make contact with the floor. If this is not possible, use a foot rest. Keep your ears over your shoulders. This will reduce stress on your neck and low back.  Trapezius  stretches/exercises Do exercises exactly as told by your health care provider and adjust them as directed. It is normal to feel mild stretching, pulling, tightness, or discomfort as you do these exercises, but you should stop right away if you feel sudden pain or your pain gets worse.   Stretching and range of motion exercises These exercises warm up your muscles and joints and improve the movement and flexibility of your shoulder. These exercises can also help to relieve pain, numbness, and tingling. If you are unable to do any of the following for any reason, do not further attempt to do it.   Exercise A: Flexion, standing     Stand and hold a broomstick, a cane, or a similar object. Place your hands a little more than shoulder-width apart on the object. Your left / right hand should be palm-up, and your other hand should be palm-down. Push the stick to raise your left / right arm out to your side and then over your head. Use your other hand to  help move the stick. Stop when you feel a stretch in your shoulder, or when you reach the angle that is recommended by your health care provider. Avoid shrugging your shoulder while you raise your arm. Keep your shoulder blade tucked down toward your spine. Hold for 30 seconds. Slowly return to the starting position. Repeat 2 times. Complete this exercise 3 times per week.  Exercise B: Abduction, supine     Lie on your back and hold a broomstick, a cane, or a similar object. Place your hands a little more than shoulder-width apart on the object. Your left / right hand should be palm-up, and your other hand should be palm-down. Push the stick to raise your left / right arm out to your side and then over your head. Use your other hand to help move the stick. Stop when you feel a stretch in your shoulder, or when you reach the angle that is recommended by your health care provider. Avoid shrugging your shoulder while you raise your arm. Keep your shoulder  blade tucked down toward your spine. Hold for 30 seconds. Slowly return to the starting position. Repeat 2 times. Complete this exercise 3 times per week.  Exercise C: Flexion, active-assisted     Lie on your back. You may bend your knees for comfort. Hold a broomstick, a cane, or a similar object. Place your hands about shoulder-width apart on the object. Your palms should face toward your feet. Raise the stick and move your arms over your head and behind your head, toward the floor. Use your healthy arm to help your left / right arm move farther. Stop when you feel a gentle stretch in your shoulder, or when you reach the angle where your health care provider tells you to stop. Hold for 30 seconds. Slowly return to the starting position. Repeat 2 times. Complete this exercise 3 times per week.  Exercise D: External rotation and abduction     Stand in a door frame with one of your feet slightly in front of the other. This is called a staggered stance. Choose one of the following positions as told by your health care provider: Place your hands and forearms on the door frame above your head. Place your hands and forearms on the door frame at the height of your head. Place your hands on the door frame at the height of your elbows. Slowly move your weight onto your front foot until you feel a stretch across your chest and in the front of your shoulders. Keep your head and chest upright and keep your abdominal muscles tight. Hold for 30 seconds. To release the stretch, shift your weight to your back foot. Repeat 2 times. Complete this stretch 3 times per week.  Strengthening exercises These exercises build strength and endurance in your shoulder. Endurance is the ability to use your muscles for a long time, even after your muscles get tired. Exercise E: Scapular depression and adduction  Sit on a stable chair. Support your arms in front of you with pillows, armrests, or a tabletop. Keep  your elbows in line with the sides of your body. Gently move your shoulder blades down toward your middle back. Relax the muscles on the tops of your shoulders and in the back of your neck. Hold for 3 seconds. Slowly release the tension and relax your muscles completely before doing this exercise again. Repeat for a total of 10 repetitions. After you have practiced this exercise, try doing the exercise without  the arm support. Then, try the exercise while standing instead of sitting. Repeat 2 times. Complete this exercise 3 times per week.  Exercise F: Shoulder abduction, isometric     Stand or sit about 4-6 inches (10-15 cm) from a wall with your left / right side facing the wall. Bend your left / right elbow and gently press your elbow against the wall. Increase the pressure slowly until you are pressing as hard as you can without shrugging your shoulder. Hold for 3 seconds. Slowly release the tension and relax your muscles completely. Repeat for a total of 10 repetitions. Repeat 2 times. Complete this exercise 3 times per week.  Exercise G: Shoulder flexion, isometric     Stand or sit about 4-6 inches (10-15 cm) away from a wall with your left / right side facing the wall. Keep your left / right elbow straight and gently press the top of your fist against the wall. Increase the pressure slowly until you are pressing as hard as you can without shrugging your shoulder. Hold for 10-15 seconds. Slowly release the tension and relax your muscles completely. Repeat for a total of 10 repetitions. Repeat 2 times. Complete this exercise 3 times per week.  Exercise H: Internal rotation     Sit in a stable chair without armrests, or stand. Secure an exercise band at your left / right side, at elbow height. Place a soft object, such as a folded towel or a small pillow, under your left / right upper arm so your elbow is a few inches (about 8 cm) away from your side. Hold the end of the exercise  band so the band stretches. Keeping your elbow pressed against the soft object under your arm, move your forearm across your body toward your abdomen. Keep your body steady so the movement is only coming from your shoulder. Hold for 3 seconds. Slowly return to the starting position. Repeat for a total of 10 repetitions. Repeat 2 times. Complete this exercise 3 times per week.  Exercise I: External rotation     Sit in a stable chair without armrests, or stand. Secure an exercise band at your left / right side, at elbow height. Place a soft object, such as a folded towel or a small pillow, under your left / right upper arm so your elbow is a few inches (about 8 cm) away from your side. Hold the end of the exercise band so the band stretches. Keeping your elbow pressed against the soft object under your arm, move your forearm out, away from your abdomen. Keep your body steady so the movement is only coming from your shoulder. Hold for 3 seconds. Slowly return to the starting position. Repeat for a total of 10 repetitions. Repeat 2 times. Complete this exercise 3 times per week. Exercise J: Shoulder extension  Sit in a stable chair without armrests, or stand. Secure an exercise band to a stable object in front of you so the band is at shoulder height. Hold one end of the exercise band in each hand. Your palms should face each other. Straighten your elbows and lift your hands up to shoulder height. Step back, away from the secured end of the exercise band, until the band stretches. Squeeze your shoulder blades together and pull your hands down to the sides of your thighs. Stop when your hands are straight down by your sides. Do not let your hands go behind your body. Hold for 3 seconds. Slowly return to the starting  position. Repeat for a total of 10 repetitions. Repeat 2 times. Complete this exercise 3 times per week.  Exercise K: Shoulder extension, prone     Lie on your abdomen on a firm  surface so your left / right arm hangs over the edge. Hold a 5 lb weight in your hand so your palm faces in toward your body. Your arm should be straight. Squeeze your shoulder blade down toward the middle of your back. Slowly raise your arm behind you, up to the height of the surface that you are lying on. Keep your arm straight. Hold for 3 seconds. Slowly return to the starting position and relax your muscles. Repeat for a total of 10 repetitions. Repeat 2 times. Complete this exercise 3 times per week.   Exercise L: Horizontal abduction, prone  Lie on your abdomen on a firm surface so your left / right arm hangs over the edge. Hold a 5 lb weight in your hand so your palm faces toward your feet. Your arm should be straight. Squeeze your shoulder blade down toward the middle of your back. Bend your elbow so your hand moves up, until your elbow is bent to an "L" shape (90 degrees). With your elbow bent, slowly move your forearm forward and up. Raise your hand up to the height of the surface that you are lying on. Your upper arm should not move, and your elbow should stay bent. At the top of the movement, your palm should face the floor. Hold for 3 seconds. Slowly return to the starting position and relax your muscles. Repeat for a total of 10 repetitions. Repeat 2 times. Complete this exercise 3 times per week.  Exercise M: Horizontal abduction, standing  Sit on a stable chair, or stand. Secure an exercise band to a stable object in front of you so the band is at shoulder height. Hold one end of the exercise band in each hand. Straighten your elbows and lift your hands straight in front of you, up to shoulder height. Your palms should face down, toward the floor. Step back, away from the secured end of the exercise band, until the band stretches. Move your arms out to your sides, and keep your arms straight. Hold for 3 seconds. Slowly return to the starting position. Repeat for a total of  10 repetitions. Repeat 2 times. Complete this exercise 3 times per week.  Exercise N: Scapular retraction and elevation  Sit on a stable chair, or stand. Secure an exercise band to a stable object in front of you so the band is at shoulder height. Hold one end of the exercise band in each hand. Your palms should face each other. Sit in a stable chair without armrests, or stand. Step back, away from the secured end of the exercise band, until the band stretches. Squeeze your shoulder blades together and lift your hands over your head. Keep your elbows straight. Hold for 3 seconds. Slowly return to the starting position. Repeat for a total of 10 repetitions. Repeat 2 times. Complete this exercise 3 times per week.  This information is not intended to replace advice given to you by your health care provider. Make sure you discuss any questions you have with your health care provider. Document Released: 01/08/2005 Document Revised: 09/15/2015 Document Reviewed: 11/25/2014 Elsevier Interactive Patient Education  2017 [employer].

## 2021-01-25 ENCOUNTER — Other Ambulatory Visit: Payer: Self-pay | Admitting: Family Medicine

## 2021-01-25 DIAGNOSIS — M542 Cervicalgia: Secondary | ICD-10-CM

## 2021-01-25 MED ORDER — MELOXICAM 15 MG PO TABS: 15.0000 mg | ORAL_TABLET | Freq: Every day | ORAL | 0 refills | Status: DC

## 2021-02-08 ENCOUNTER — Encounter: Payer: Self-pay | Admitting: Family Medicine

## 2021-02-13 ENCOUNTER — Ambulatory Visit (INDEPENDENT_AMBULATORY_CARE_PROVIDER_SITE_OTHER): Payer: BC Managed Care – PPO | Admitting: Family Medicine

## 2021-02-13 ENCOUNTER — Encounter: Payer: Self-pay | Admitting: Family Medicine

## 2021-02-13 VITALS — BP 128/72 | HR 101 | Temp 99.0°F | Ht 67.0 in | Wt 200.2 lb

## 2021-02-13 DIAGNOSIS — M542 Cervicalgia: Secondary | ICD-10-CM

## 2021-02-13 DIAGNOSIS — L918 Other hypertrophic disorders of the skin: Secondary | ICD-10-CM | POA: Diagnosis not present

## 2021-02-13 NOTE — Addendum Note (Signed)
Addended by: Ames Coupe on: 02/13/2021 05:01 PM   Modules accepted: Orders

## 2021-02-13 NOTE — Progress Notes (Signed)
Musculoskeletal Exam  Patient: Erika Garrett DOB: 08/08/1980  DOS: 02/13/2021  SUBJECTIVE:  Chief Complaint:   Chief Complaint  Patient presents with   Follow-up    Muscle pain     Erika Garrett is a 41 y.o.  female for evaluation and treatment of L neck/shoulder pain.   Onset:  1 month ago. No inj or change in activity.  Location: L trap upper back area Character:  aching and burning  Progression of issue:  has worsened Associated symptoms: tingling in LUE Treatment: to date has been rest, ice, OTC NSAIDS, prescription NSAIDS, and muscle relaxers.   Neurovascular symptoms: Yes, tingling; no weakness  Skin tag-patient has had an irritated skin tag over the left side of her neck for several months.  No bleeding or recent injury.  It does not itch or drain.  She has not tried anything for it so far.  She would like it removed.  Past Medical History:  Diagnosis Date   Allergy    Anxiety    Back pain    Bilateral swelling of feet    Constipation    Depression    Elevated liver enzymes    Endometriosis    Hyperlipidemia    hx of, no medications   Hypertension 2016   Intercranial Hypertension   Intracranial hypertension    Joint pain    Other fatigue    Pseudotumor cerebri induced by OCPs 05/22/2017   Shortness of breath on exertion    Subclinical hypothyroidism    Ventral hernia    Vitamin D deficiency     Objective: VITAL SIGNS: BP 128/72    Pulse (!) 101    Temp 99 F (37.2 C) (Oral)    Ht 5\' 7"  (1.702 m)    Wt 200 lb 4 oz (90.8 kg)    LMP 11/27/2019    SpO2 97%    BMI 31.36 kg/m  Constitutional: Well formed, well developed. No acute distress. Skin: Acrochordon noted of the left anterior neck without erythema or drainage Thorax & Lungs: No accessory muscle use Musculoskeletal: L shoulder.   Normal active range of motion: yes.   Normal passive range of motion: yes Tenderness to palpation: Yes, over the omohyoid muscle Deformity: no Ecchymosis: no Tests  positive: None Tests negative: Spurling's Neurologic: Normal sensory function. No focal deficits noted.  5/5 strength in the upper extremities bilaterally Psychiatric: Normal mood. Age appropriate judgment and insight. Alert & oriented x 3.    Procedure note; skin tag removal Informed consent obtained. Indication: Irritation The area was cleaned with alcohol and then anesthetized with 0.25 mL of 1% lidocaine with epinephrine. After adequate anesthesia, the area was grasped with forceps and shaved off with a razor. Hemostasis was obtained through electrical cautery. Triple antibiotic ointment and a Band-Aid were placed. There were no complications noted. The patient tolerated the procedure well.   Assessment:  Neck pain - Plan: Ambulatory referral to Physical Therapy  Skin tag  Plan: She has a large knot over the omohyoid muscle.  Stretches/exercises, heat, ice, Tylenol.  Refer to physical therapy for other modalities. Removed today.  Warning signs and symptoms verbalized written down in addition to aftercare instructions. F/u as originally scheduled. The patient voiced understanding and agreement to the plan.   Churchill, DO 02/13/21  5:00 PM

## 2021-02-13 NOTE — Patient Instructions (Addendum)
Heat (pad or rice pillow in microwave) over affected area, 10-15 minutes twice daily.   Ice/cold pack over area for 10-15 min twice daily.  OK to take Tylenol 1000 mg (2 extra strength tabs) or 975 mg (3 regular strength tabs) every 6 hours as needed.  Do not shower for the rest of the day. When you do wash it, use only soap and water. Do not vigorously scrub. Apply triple antibiotic ointment (like Neosporin) twice daily. Keep the area clean and dry.   Things to look out for: increasing pain not relieved by ibuprofen/acetaminophen, fevers, spreading redness, drainage of pus, or foul odor.  Let us know if you need anything.  EXERCISES RANGE OF MOTION (ROM) AND STRETCHING EXERCISES  These exercises may help you when beginning to rehabilitate your issue. In order to successfully resolve your symptoms, you must improve your posture. These exercises are designed to help reduce the forward-head and rounded-shoulder posture which contributes to this condition. Your symptoms may resolve with or without further involvement from your physician, physical therapist or athletic trainer. While completing these exercises, remember:  Restoring tissue flexibility helps normal motion to return to the joints. This allows healthier, less painful movement and activity. An effective stretch should be held for at least 20 seconds, although you may need to begin with shorter hold times for comfort. A stretch should never be painful. You should only feel a gentle lengthening or release in the stretched tissue. Do not do any stretch or exercise that you cannot tolerate.  STRETCH- Axial Extensors Lie on your back on the floor. You may bend your knees for comfort. Place a rolled-up hand towel or dish towel, about 2 inches in diameter, under the part of your head that makes contact with the floor. Gently tuck your chin, as if trying to make a "double chin," until you feel a gentle stretch at the base of your head. Hold  15-20 seconds. Repeat 2-3 times. Complete this exercise 1 time per day.   STRETCH - Axial Extension  Stand or sit on a firm surface. Assume a good posture: chest up, shoulders drawn back, abdominal muscles slightly tense, knees unlocked (if standing) and feet hip width apart. Slowly retract your chin so your head slides back and your chin slightly lowers. Continue to look straight ahead. You should feel a gentle stretch in the back of your head. Be certain not to feel an aggressive stretch since this can cause headaches later. Hold for 15-20 seconds. Repeat 2-3 times. Complete this exercise 1 time per day.  STRETCH - Cervical Side Bend  Stand or sit on a firm surface. Assume a good posture: chest up, shoulders drawn back, abdominal muscles slightly tense, knees unlocked (if standing) and feet hip width apart. Without letting your nose or shoulders move, slowly tip your right / left ear to your shoulder until your feel a gentle stretch in the muscles on the opposite side of your neck. Hold 15-20 seconds. Repeat 2-3 times. Complete this exercise 1-2 times per day.  STRETCH - Cervical Rotators  Stand or sit on a firm surface. Assume a good posture: chest up, shoulders drawn back, abdominal muscles slightly tense, knees unlocked (if standing) and feet hip width apart. Keeping your eyes level with the ground, slowly turn your head until you feel a gentle stretch along the back and opposite side of your neck. Hold 15-20 seconds. Repeat 2-3 times. Complete this exercise 1-2 times per day.  RANGE OF MOTION - Neck Circles  Stand or sit on a firm surface. Assume a good posture: chest up, shoulders drawn back, abdominal muscles slightly tense, knees unlocked (if standing) and feet hip width apart. Gently roll your head down and around from the back of one shoulder to the back of the other. The motion should never be forced or painful. Repeat the motion 10-20 times, or until you feel the neck muscles  relax and loosen. Repeat 2-3 times. Complete the exercise 1-2 times per day. STRENGTHENING EXERCISES - Cervical Strain and Sprain These exercises may help you when beginning to rehabilitate your injury. They may resolve your symptoms with or without further involvement from your physician, physical therapist, or athletic trainer. While completing these exercises, remember:  Muscles can gain both the endurance and the strength needed for everyday activities through controlled exercises. Complete these exercises as instructed by your physician, physical therapist, or athletic trainer. Progress the resistance and repetitions only as guided. You may experience muscle soreness or fatigue, but the pain or discomfort you are trying to eliminate should never worsen during these exercises. If this pain does worsen, stop and make certain you are following the directions exactly. If the pain is still present after adjustments, discontinue the exercise until you can discuss the trouble with your clinician.  STRENGTH - Cervical Flexors, Isometric Face a wall, standing about 6 inches away. Place a small pillow, a ball about 6-8 inches in diameter, or a folded towel between your forehead and the wall. Slightly tuck your chin and gently push your forehead into the soft object. Push only with mild to moderate intensity, building up tension gradually. Keep your jaw and forehead relaxed. Hold 10 to 20 seconds. Keep your breathing relaxed. Release the tension slowly. Relax your neck muscles completely before you start the next repetition. Repeat 2-3 times. Complete this exercise 1 time per day.  STRENGTH- Cervical Lateral Flexors, Isometric  Stand about 6 inches away from a wall. Place a small pillow, a ball about 6-8 inches in diameter, or a folded towel between the side of your head and the wall. Slightly tuck your chin and gently tilt your head into the soft object. Push only with mild to moderate intensity,  building up tension gradually. Keep your jaw and forehead relaxed. Hold 10 to 20 seconds. Keep your breathing relaxed. Release the tension slowly. Relax your neck muscles completely before you start the next repetition. Repeat 2-3 times. Complete this exercise 1 time per day.  STRENGTH - Cervical Extensors, Isometric  Stand about 6 inches away from a wall. Place a small pillow, a ball about 6-8 inches in diameter, or a folded towel between the back of your head and the wall. Slightly tuck your chin and gently tilt your head back into the soft object. Push only with mild to moderate intensity, building up tension gradually. Keep your jaw and forehead relaxed. Hold 10 to 20 seconds. Keep your breathing relaxed. Release the tension slowly. Relax your neck muscles completely before you start the next repetition. Repeat 2-3 times. Complete this exercise 1 time per day.  POSTURE AND BODY MECHANICS CONSIDERATIONS Keeping correct posture when sitting, standing or completing your activities will reduce the stress put on different body tissues, allowing injured tissues a chance to heal and limiting painful experiences. The following are general guidelines for improved posture. Your physician or physical therapist will provide you with any instructions specific to your needs. While reading these guidelines, remember: The exercises prescribed by your provider will help you have  the flexibility and strength to maintain correct postures. The correct posture provides the optimal environment for your joints to work. All of your joints have less wear and tear when properly supported by a spine with good posture. This means you will experience a healthier, less painful body. Correct posture must be practiced with all of your activities, especially prolonged sitting and standing. Correct posture is as important when doing repetitive low-stress activities (typing) as it is when doing a single heavy-load activity  (lifting).  PROLONGED STANDING WHILE SLIGHTLY LEANING FORWARD When completing a task that requires you to lean forward while standing in one place for a long time, place either foot up on a stationary 2- to 4-inch high object to help maintain the best posture. When both feet are on the ground, the low back tends to lose its slight inward curve. If this curve flattens (or becomes too large), then the back and your other joints will experience too much stress, fatigue more quickly, and can cause pain.   RESTING POSITIONS Consider which positions are most painful for you when choosing a resting position. If you have pain with flexion-based activities (sitting, bending, stooping, squatting), choose a position that allows you to rest in a less flexed posture. You would want to avoid curling into a fetal position on your side. If your pain worsens with extension-based activities (prolonged standing, working overhead), avoid resting in an extended position such as sleeping on your stomach. Most people will find more comfort when they rest with their spine in a more neutral position, neither too rounded nor too arched. Lying on a non-sagging bed on your side with a pillow between your knees, or on your back with a pillow under your knees will often provide some relief. Keep in mind, being in any one position for a prolonged period of time, no matter how correct your posture, can still lead to stiffness.  WALKING Walk with an upright posture. Your ears, shoulders, and hips should all line up. OFFICE WORK When working at a desk, create an environment that supports good, upright posture. Without extra support, muscles fatigue and lead to excessive strain on joints and other tissues.  CHAIR: A chair should be able to slide under your desk when your back makes contact with the back of the chair. This allows you to work closely. The chair's height should allow your eyes to be level with the upper part of your  monitor and your hands to be slightly lower than your elbows. Body position: Your feet should make contact with the floor. If this is not possible, use a foot rest. Keep your ears over your shoulders. This will reduce stress on your neck and low back.

## 2021-02-14 DIAGNOSIS — N809 Endometriosis, unspecified: Secondary | ICD-10-CM | POA: Diagnosis not present

## 2021-02-14 DIAGNOSIS — Z1231 Encounter for screening mammogram for malignant neoplasm of breast: Secondary | ICD-10-CM | POA: Diagnosis not present

## 2021-02-14 DIAGNOSIS — Z01419 Encounter for gynecological examination (general) (routine) without abnormal findings: Secondary | ICD-10-CM | POA: Diagnosis not present

## 2021-02-23 ENCOUNTER — Other Ambulatory Visit: Payer: Self-pay

## 2021-02-23 ENCOUNTER — Ambulatory Visit: Payer: BC Managed Care – PPO | Attending: Family Medicine | Admitting: Physical Therapy

## 2021-02-23 ENCOUNTER — Encounter: Payer: Self-pay | Admitting: Physical Therapy

## 2021-02-23 DIAGNOSIS — R252 Cramp and spasm: Secondary | ICD-10-CM | POA: Diagnosis not present

## 2021-02-23 DIAGNOSIS — R293 Abnormal posture: Secondary | ICD-10-CM | POA: Insufficient documentation

## 2021-02-23 DIAGNOSIS — R279 Unspecified lack of coordination: Secondary | ICD-10-CM | POA: Insufficient documentation

## 2021-02-23 DIAGNOSIS — M6281 Muscle weakness (generalized): Secondary | ICD-10-CM | POA: Insufficient documentation

## 2021-02-23 DIAGNOSIS — M542 Cervicalgia: Secondary | ICD-10-CM | POA: Diagnosis not present

## 2021-02-23 NOTE — Therapy (Signed)
[place]. [place], [place], [address] Phone: [phone]   Fax:  [phone]  Physical Therapy Evaluation  Patient Details  Name: Erika Garrett MRN: [id number] Date of Birth: 1980/09/15 Referring Provider (PT): Nani Ravens   Encounter Date: 02/23/2021   PT End of Session - 02/23/21 6962     Visit Number 1    Number of Visits 13    Date for PT Re-Evaluation 04/06/21    Authorization Type [employer]    Authorization Time Period 02/23/21 to 04/06/21    PT Start Time 9528    PT Stop Time 1448    PT Time Calculation (min) 45 min    Activity Tolerance Patient tolerated treatment well    Behavior During Therapy [facility] for tasks assessed/performed;Anxious             Past Medical History:  Diagnosis Date   Allergy    Anxiety    Back pain    Bilateral swelling of feet    Constipation    Depression    Elevated liver enzymes    Endometriosis    Hyperlipidemia    hx of, no medications   Hypertension 2016   Intercranial Hypertension   Intracranial hypertension    Joint pain    Other fatigue    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    Subjective Assessment - 02/23/21 1407     Subjective I've been flying the past 2 days coming back from [place], got snarled up in flight delays with weather. Had covid in July, after I was released from quarantine I immediately worked [employer] worker. That August I was having stabbing pain in my L side wrapping around and going into my left breast area, MD told me it was MSK/tight muscles. Its progressed to left upper quadrant, upper trap, lats, pecs. Had flu in November. Finally got it  out of breast area, but in December I started getting tingling in my left arm which worried me, MD said I was OK. I went to [facility] in January I started getting a "falling asleep" feeling in my left arm, when that happens everything starts to hurt much more.I get massages weekly, when the therapist goes to massage my neck and works on the side of my neck there is a spot that brings tears to my eyes. Before I went to [place], the MD gave me some neck stretches and encouraged me to massage my neck on my own. My arm didn't go numb in [place]. It feels like I have a ball rolling around in that area.    Patient Stated Goals get pain gone, be able to travel without difficulty    Currently in Pain? No/denies   just tight right now, tend to "set pain off" when I sit at computer for too long               [facility] PT Assessment - 02/23/21 0001       Assessment   Medical Diagnosis neck pain    Referring Provider (PT) Wendling    Onset Date/Surgical Date --   July 2022 after covid  Next MD Visit Wendling PRN    Prior Therapy PT in the past before abdominal surgery/for adhesions   pelvic health PT     Precautions   Precautions None      Restrictions   Weight Bearing Restrictions No      Balance Screen   Has the patient fallen in the past 6 months No    Has the patient had a decrease in activity level because of a fear of falling?  No    Is the patient reluctant to leave their home because of a fear of falling?  No      Home Ecologist residence      Prior Function   Level of Independence Independent;Independent with basic ADLs    Vocation Full time employment    [employer], travels quite a bit for work    Leisure "life is mostly work", cooking, travelling for fun      Posture/Postural Control   Posture/Postural Control Postural limitations    Postural Limitations Rounded Shoulders;Forward head;Increased thoracic kyphosis      ROM  / Strength   AROM / PROM / Strength Strength;AROM      AROM   AROM Assessment Site Cervical;Thoracic    Cervical Flexion WNL    Cervical Extension WNL    Cervical - Right Side Bend moderate limitation    Cervical - Left Side Bend moderate limitation    Cervical - Right Rotation WNL    Cervical - Left Rotation WNL    Thoracic Flexion WNL    Thoracic Extension WNL    Thoracic - Right Side Bend WNL    Thoracic - Left Side Bend WNL    Thoracic - Right Rotation WNL    Thoracic - Left Rotation WNL      Strength   Strength Assessment Site Shoulder;Elbow    Right/Left Shoulder Right;Left    Right Shoulder Flexion 4+/5    Right Shoulder Extension 4/5    Right Shoulder ABduction 4+/5    Right Shoulder Internal Rotation 4/5    Right Shoulder External Rotation 4/5    Left Shoulder Flexion 4+/5    Left Shoulder Extension 4/5    Left Shoulder ABduction 4+/5    Left Shoulder Internal Rotation 4/5    Left Shoulder External Rotation 4/5    Right/Left Elbow Right;Left    Right Elbow Flexion 3/5    Right Elbow Extension 4/5    Left Elbow Flexion 3/5    Left Elbow Extension 4/5      Palpation   Palpation comment very very tight in scalenes, deep lateral cervical musculature, upper trap, SCM; first ribs not very mobile;                        Objective measurements completed on examination: See above findings.       [facility] Adult PT Treatment/Exercise - 02/23/21 0001       Manual Therapy   Manual Therapy Soft tissue mobilization    Manual therapy comments approximately 5 minutes    Soft tissue mobilization anterior and middle scalenes, UT, L SCM                     PT Education - 02/23/21 1639     Education Details exam findings, POC, HEP; spent a lot of time talking about general anatomy of lateral neck/thoracic outlet area, multiple muscle groups, blood vessels, and nerves that  run through area. Also lots of time talking about how PT can help and how  this seems to be presenting as MSK in nature, possibly driven by physical weakness from multiple health conditions thru past year and poor ergonomics at work. Offered DN/potential benefits.    Person(s) Educated Patient    Methods Explanation;Handout    Comprehension Verbalized understanding;Need further instruction              PT Short Term Goals - 02/23/21 1647       PT SHORT TERM GOAL #1   Title Will be compliant with appropriate progressive HEP, to be updated PRN    Time 3    Period Weeks    Status New    Target Date 03/16/21      PT SHORT TERM GOAL #2   Title Will demonstrate more upright posture  as well as improved general postural awareness at least 50% of the time    Time 3    Period Weeks    Status New      PT SHORT TERM GOAL #3   Title Frequency and intensity of severe bouts of pain in cervical spine and LUE will improve by at least 50% to show improvement of condition    Time 3    Period Weeks    Status New      PT SHORT TERM GOAL #4   Title Will be able to name at least 3 ways to improve ergonomic set up of work station and 3 techniques to relieve stress    Time 3    Period Weeks    Status New               PT Long Term Goals - 02/23/21 1650       PT LONG TERM GOAL #1   Title MMT to improve to 5/5 in all weak groups to reduce pain    Time 6    Period Weeks    Status New    Target Date 04/06/21      PT LONG TERM GOAL #2   Title Will be able to perform all work related tasks for unlimited duration with cervical and LUE pain no more than 1/10 and no feelings of arm "falling asleep"    Time 6    Period Weeks    Status New      PT LONG TERM GOAL #3   Title Will be compliant with appropriate gym based exercise program to maintain functional gains and prevent recurrence of pain    Time 6    Period Weeks    Status New                    Plan - 02/23/21 1642     Clinical Impression Statement Alvenia arrives today very concerned about  what might be causing her neck pain- sounds like she had a rough year between major abdominal surgery, Covid, and flu on top of being quite busy for work. Sounds like this got quite severe at one point, has improved since that time but she does have a ball sitting in my neck that can cause radicular symptoms when irritated. Not sure of specific activities that increase pain besides working on computer. Referring DO did some trigger point release which sounds like it was successful in improving her pain temporarily. Exam reveals postural impairments, generalized upper extremity weakness, and significant soft tissue limitations and spasm most notably in deep mm groups such as  the anterior and middle scalenes. I do wonder if she may be having some sort of thoracic outlet syndrome or entrapment happening given symptoms, scalene tightness, and first rib immobility. Did respond really well to scalene trigger point release today, politely declines DN for now. Will progress as able and tolerated moving forward.    Personal Factors and Comorbidities Age;Past/Current Experience;Profession;Social Background;Time since onset of injury/illness/exacerbation    Examination-Activity Limitations Reach Overhead;Carry;Caring for Others;Dressing;Lift    Examination-Participation Restrictions Occupation;Cleaning;Community Activity;Driving;Interpersonal Relationship;Laundry;Shop;Yard Work    [employer]chant [employer] officer Evolving/Moderate complexity    Clinical Decision Making Moderate    Rehab Potential Good    PT Frequency 2x / week    PT Duration 6 weeks    PT Treatment/Interventions ADLs/Self Care Home Management;Cryotherapy;Electrical Stimulation;Iontophoresis 4mg /ml Dexamethasone;Moist Heat;Ultrasound;Therapeutic activities;Therapeutic exercise;Patient/family education;Manual techniques;Passive range of motion;Dry needling;Taping    PT Next Visit Plan how is HEP going? General upper body strength and postural  training, include STM each session, need to talk about work ergonomics    PT [place] and Agree with Plan of Care Patient             Patient will benefit from skilled therapeutic intervention in order to improve the following deficits and impairments:  Decreased range of motion, Increased fascial restricitons, Increased muscle spasms, Impaired UE functional use, Decreased activity tolerance, Pain, Hypomobility, Impaired flexibility, Improper body mechanics, Decreased mobility, Decreased strength, Postural dysfunction  Visit Diagnosis: Abnormal posture  Muscle weakness (generalized)  Cramp and spasm     Problem List Patient Active Problem List   Diagnosis Date Noted   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 [place], [place], [address] Phone: [phone]   Fax:  [phone]  Name: Mirha Brucato MRN: [id number] Date of Birth: 24-Jan-1980

## 2021-02-23 NOTE — Addendum Note (Signed)
Addended by: Hunt Oris on: 02/23/2021 04:57 PM   Modules accepted: Orders

## 2021-02-27 ENCOUNTER — Encounter (INDEPENDENT_AMBULATORY_CARE_PROVIDER_SITE_OTHER): Payer: Self-pay | Admitting: Family Medicine

## 2021-02-27 ENCOUNTER — Ambulatory Visit (INDEPENDENT_AMBULATORY_CARE_PROVIDER_SITE_OTHER): Payer: BC Managed Care – PPO | Admitting: Family Medicine

## 2021-02-27 ENCOUNTER — Other Ambulatory Visit: Payer: Self-pay

## 2021-02-27 VITALS — BP 108/72 | HR 70 | Temp 98.0°F | Ht 67.0 in | Wt 198.0 lb

## 2021-02-27 DIAGNOSIS — E669 Obesity, unspecified: Secondary | ICD-10-CM

## 2021-02-27 DIAGNOSIS — E538 Deficiency of other specified B group vitamins: Secondary | ICD-10-CM

## 2021-02-27 DIAGNOSIS — Z9189 Other specified personal risk factors, not elsewhere classified: Secondary | ICD-10-CM

## 2021-02-27 DIAGNOSIS — E559 Vitamin D deficiency, unspecified: Secondary | ICD-10-CM

## 2021-02-27 DIAGNOSIS — R7989 Other specified abnormal findings of blood chemistry: Secondary | ICD-10-CM | POA: Diagnosis not present

## 2021-02-27 DIAGNOSIS — Z6831 Body mass index (BMI) 31.0-31.9, adult: Secondary | ICD-10-CM

## 2021-02-27 DIAGNOSIS — E782 Mixed hyperlipidemia: Secondary | ICD-10-CM

## 2021-02-27 NOTE — Progress Notes (Signed)
Chief Complaint:   OBESITY Erika Garrett is here to discuss her progress with her obesity treatment plan along with follow-up of her obesity related diagnoses. Erika Garrett is on keeping a food journal and adhering to recommended goals of 1200-1500 calories and 85+ grams of protein daily and states she is following her eating plan approximately 80% of the time. Erika Garrett states she is doing 0 minutes 0 times per week.  Today's visit was #: 11 Starting weight: 200 lbs Starting date: 03/17/2020 Today's weight: 198 lbs Today's date: 02/27/2021 Total lbs lost to date: 2 Total lbs lost since last in-office visit: 0  Interim History: Erika Garrett's last visit was >2 months ago. She hasn't been able to concentrate on meal planning and she has traveled more. She is struggling to find a meal plan that she can stick to strictly and she frustrated with her lack of progress.  Subjective:   1. LFT elevation Erika Garrett has a history of mildly elevated LFT. No abdominal pain or jaundice was noted. This is likely related to non-alcoholic fatty liver disease.  2. Mixed hyperlipidemia Erika Garrett is working on diet and exercise, ans she is not on statin. She is due for labs.  3. Vitamin D deficiency Erika Garrett is on Vitamin OTC, and she is due for labs.  4. B12 deficiency Erika Garrett's last B12 was low, and she is not on B12. She has worked to increase her protein, but still seems to be deficient. She is due for labs.  5. At risk for impaired metabolic function Erika Garrett is at increased risk for impaired metabolic function if protein is too low.  Assessment/Plan:   1. LFT elevation We discussed the likely diagnosis of non-alcoholic fatty liver disease today and how this condition is obesity related. Erika Garrett was educated the importance of weight loss. We will check labs today. Erika Garrett agreed to continue work on her weight loss efforts with healthier diet and exercise as an essential part of her treatment plan.  - CMP14+EGFR  2. Mixed  hyperlipidemia Cardiovascular risk and specific lipid/LDL goals reviewed. We discussed several lifestyle modifications today. We will check labs today. Erika Garrett will continue to work on diet, exercise and weight loss efforts. Orders and follow up as documented in patient record.   - Lipid Panel With LDL/HDL Ratio  3. Vitamin D deficiency Low Vitamin D level contributes to fatigue and are associated with obesity, breast, and colon cancer. We will check labs today. Erika Garrett will follow-up for routine testing of Vitamin D, at least 2-3 times per year to avoid over-replacement.  - VITAMIN D 25 Hydroxy (Vit-D Deficiency, Fractures)  4. B12 deficiency The diagnosis was reviewed with the patient. We will check labs today, and Erika Garrett will continue to increase protein in her diet. Orders and follow up as documented in patient record.  - Vitamin B12  5. At risk for impaired metabolic function Erika Garrett was given approximately 15 minutes of impaired  metabolic function prevention counseling today. We discussed intensive lifestyle modifications today with an emphasis on specific nutrition and exercise instructions and strategies.   Repetitive spaced learning was employed today to elicit superior memory formation and behavioral change.  6. Obesity BMI today is 31.1 Erika Garrett is not currently in the action stage of change. As such, her goal is to continue with weight loss efforts. She has agreed to change to keeping a food journal and adhering to recommended goals of 1300-1500 calories and 85+ grams of protein daily.   Erika Garrett was given multiple options. She  will try a meal prep service to see if this will help her.  Behavioral modification strategies: increasing lean protein intake, meal planning and cooking strategies, and keeping a strict food journal.  Erika Garrett has agreed to follow-up with our clinic in 4 weeks. She was informed of the importance of frequent follow-up visits to maximize her success with intensive lifestyle  modifications for her multiple health conditions.   Objective:   Blood pressure 108/72, pulse 70, temperature 98 F (36.7 C), height 5' 7"  (1.702 m), weight 198 lb (89.8 kg), last menstrual period 11/27/2019, SpO2 98 %. Body mass index is 31.01 kg/m.  General: Cooperative, alert, well developed, in no acute distress. HEENT: Conjunctivae and lids unremarkable. Cardiovascular: Regular rhythm.  Lungs: Normal work of breathing. Neurologic: No focal deficits.   Lab Results  Component Value Date   CREATININE 0.65 10/10/2020   BUN 18 10/10/2020   NA 141 10/10/2020   K 4.5 10/10/2020   CL 106 10/10/2020   CO2 21 10/10/2020   Lab Results  Component Value Date   ALT 45 (H) 10/10/2020   AST 28 10/10/2020   ALKPHOS 83 10/10/2020   BILITOT 0.3 10/10/2020   Lab Results  Component Value Date   HGBA1C 5.2 10/10/2020   HGBA1C 5.2 03/17/2020   Lab Results  Component Value Date   INSULIN 10.6 10/10/2020   INSULIN 6.4 03/17/2020   Lab Results  Component Value Date   TSH 2.38 02/01/2020   Lab Results  Component Value Date   CHOL 179 11/04/2020   HDL 34.50 (L) 11/04/2020   LDLCALC 112 (H) 11/04/2020   LDLDIRECT 135.0 09/17/2018   TRIG 160.0 (H) 11/04/2020   CHOLHDL 5 11/04/2020   Lab Results  Component Value Date   VD25OH 29.76 (L) 09/23/2020   VD25OH 33.06 02/01/2020   VD25OH 37.29 07/11/2017   Lab Results  Component Value Date   WBC 9.3 09/23/2020   HGB 14.5 09/23/2020   HCT 42.8 09/23/2020   MCV 91.6 09/23/2020   PLT 230.0 09/23/2020   No results found for: IRON, TIBC, FERRITIN  Attestation Statements:   Reviewed by clinician on day of visit: allergies, medications, problem list, medical history, surgical history, family history, social history, and previous encounter notes.   I, Trixie Dredge, am acting as transcriptionist for Dennard Nip, MD.  I have reviewed the above documentation for accuracy and completeness, and I agree with the above. -  Dennard Nip, MD

## 2021-02-28 ENCOUNTER — Ambulatory Visit: Payer: BC Managed Care – PPO | Admitting: Physical Therapy

## 2021-02-28 ENCOUNTER — Encounter: Payer: Self-pay | Admitting: Physical Therapy

## 2021-02-28 DIAGNOSIS — M542 Cervicalgia: Secondary | ICD-10-CM | POA: Diagnosis not present

## 2021-02-28 DIAGNOSIS — R252 Cramp and spasm: Secondary | ICD-10-CM | POA: Diagnosis not present

## 2021-02-28 DIAGNOSIS — R279 Unspecified lack of coordination: Secondary | ICD-10-CM | POA: Diagnosis not present

## 2021-02-28 DIAGNOSIS — R293 Abnormal posture: Secondary | ICD-10-CM | POA: Diagnosis not present

## 2021-02-28 DIAGNOSIS — M6281 Muscle weakness (generalized): Secondary | ICD-10-CM

## 2021-02-28 LAB — CMP14+EGFR
ALT: 39 IU/L — ABNORMAL HIGH (ref 0–32)
AST: 22 IU/L (ref 0–40)
Albumin/Globulin Ratio: 2 (ref 1.2–2.2)
Albumin: 4.7 g/dL (ref 3.8–4.8)
Alkaline Phosphatase: 94 IU/L (ref 44–121)
BUN/Creatinine Ratio: 31 — ABNORMAL HIGH (ref 9–23)
BUN: 23 mg/dL (ref 6–24)
Bilirubin Total: 0.4 mg/dL (ref 0.0–1.2)
CO2: 20 mmol/L (ref 20–29)
Calcium: 9 mg/dL (ref 8.7–10.2)
Chloride: 102 mmol/L (ref 96–106)
Creatinine, Ser: 0.75 mg/dL (ref 0.57–1.00)
Globulin, Total: 2.3 g/dL (ref 1.5–4.5)
Glucose: 98 mg/dL (ref 70–99)
Potassium: 4.5 mmol/L (ref 3.5–5.2)
Sodium: 138 mmol/L (ref 134–144)
Total Protein: 7 g/dL (ref 6.0–8.5)
eGFR: 103 mL/min/{1.73_m2} (ref >59)

## 2021-02-28 LAB — VITAMIN B12: Vitamin B-12: 570 pg/mL (ref 232–1245)

## 2021-02-28 LAB — VITAMIN D 25 HYDROXY (VIT D DEFICIENCY, FRACTURES): Vit D, 25-Hydroxy: 43.6 ng/mL (ref 30.0–100.0)

## 2021-02-28 LAB — LIPID PANEL WITH LDL/HDL RATIO
Cholesterol, Total: 223 mg/dL — ABNORMAL HIGH (ref 100–199)
HDL: 45 mg/dL (ref >39)
LDL Chol Calc (NIH): 142 mg/dL — ABNORMAL HIGH (ref 0–99)
LDL/HDL Ratio: 3.2 ratio (ref 0.0–3.2)
Triglycerides: 198 mg/dL — ABNORMAL HIGH (ref 0–149)
VLDL Cholesterol Cal: 36 mg/dL (ref 5–40)

## 2021-02-28 NOTE — Therapy (Signed)
[facility]. [place], [place], [address] Phone: [phone]   Fax:  [phone]  Physical Therapy Treatment  Patient Details  Name: Erika Garrett MRN: [id number] Date of Birth: 08-15-1980 Referring Provider (PT): Nani Ravens   Encounter Date: 02/28/2021   PT End of Session - 02/28/21 0933     Visit Number 2    Number of Visits 13    Date for PT Re-Evaluation 04/06/21    Authorization Type [employer]    Authorization Time Period 02/23/21 to 04/06/21    PT Start Time 0848    PT Stop Time 0930    PT Time Calculation (min) 42 min    Activity Tolerance Patient tolerated treatment well    Behavior During Therapy [facility] for tasks assessed/performed             Past Medical History:  Diagnosis Date   Allergy    Anxiety    Back pain    Bilateral swelling of feet    Constipation    Depression    Elevated liver enzymes    Endometriosis    Hyperlipidemia    hx of, no medications   Hypertension 2016   Intercranial Hypertension   Intracranial hypertension    Joint pain    Other fatigue    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   Subjective Assessment - 02/28/21 0849     Subjective My neck is usually stiff in the mornings, I'm not sure what I do at night to do this to myself. Right side is not as stiff as the left side. Neck feels fine to me where you were pressing last time but my upper trap is hard this morning. I'm using more of a flat pillow now, its helped a little bit, my neck feels less stiff. Have not had any more symptoms in L UE where "it feels dead".    Patient Stated Goals get pain gone, be able to travel without  difficulty    Currently in Pain? Yes    Pain Score 5     Pain Location Neck    Pain Orientation Left    Pain Descriptors / Indicators Tightness   pinching   Pain Type Chronic pain                               [facility] Adult PT Treatment/Exercise - 02/28/21 0001       Exercises   Exercises Neck      Neck Exercises: Seated   Neck Retraction 10 reps;3 secs    Cervical Rotation 10 reps    Cervical Rotation Limitations with chin tuck      Neck Exercises: Stretches   Corner Stretch 3 reps;30 seconds    Other Neck Stretches pec minor stretch x2      Manual Therapy   Manual Therapy Joint mobilization;Soft tissue mobilization    Joint Mobilization first rib mobs L    Soft tissue mobilization anterior and middle scalenes, UT, L SCM  PT Education - 02/28/21 0932     Education Details encouraged trying U-pillow for more support of c-spine when sleeping to reduce odd positioning of neck during sleep, HEP updates; ongoing education on complex anatomy of lateral neck    Person(s) Educated Patient    Methods Explanation;Handout    Comprehension Verbalized understanding              PT Short Term Goals - 02/23/21 1647       PT SHORT TERM GOAL #1   Title Will be compliant with appropriate progressive HEP, to be updated PRN    Time 3    Period Weeks    Status New    Target Date 03/16/21      PT SHORT TERM GOAL #2   Title Will demonstrate more upright posture  as well as improved general postural awareness at least 50% of the time    Time 3    Period Weeks    Status New      PT SHORT TERM GOAL #3   Title Frequency and intensity of severe bouts of pain in cervical spine and LUE will improve by at least 50% to show improvement of condition    Time 3    Period Weeks    Status New      PT SHORT TERM GOAL #4   Title Will be able to name at least 3 ways to improve ergonomic set up of work station and 3 techniques to relieve  stress    Time 3    Period Weeks    Status New               PT Long Term Goals - 02/23/21 1650       PT LONG TERM GOAL #1   Title MMT to improve to 5/5 in all weak groups to reduce pain    Time 6    Period Weeks    Status New    Target Date 04/06/21      PT LONG TERM GOAL #2   Title Will be able to perform all work related tasks for unlimited duration with cervical and LUE pain no more than 1/10 and no feelings of arm "falling asleep"    Time 6    Period Weeks    Status New      PT LONG TERM GOAL #3   Title Will be compliant with appropriate gym based exercise program to maintain functional gains and prevent recurrence of pain    Time 6    Period Weeks    Status New                   Plan - 02/28/21 0934     Clinical Impression Statement Erika Garrett arrives today still having pain in her left cervical area, has not had any more episodes where her L arm feels dead. Spent quite a bit of time on manual techniques including first rib mobs as well as ongoing deep tissue work to scalenes and upper trap today. Also worked on some stretches and postural strengthening especially with progression of exercises in cervical retraction. I still think she may have quite a bit of tightness in thoracic outlet area. Will continue to progress as tolerated, I think we did find some activities that will help improve her pain today.    Personal Factors and Comorbidities Age;Past/Current Experience;Profession;Social Background;Time since onset of injury/illness/exacerbation    Examination-Activity Limitations Reach Overhead;Carry;Caring for Others;Dressing;Lift    Examination-Participation Restrictions  Occupation;Cleaning;Community Activity;Driving;Interpersonal Relationship;Laundry;Shop;Yard Work    [employer] officer Evolving/Moderate complexity    Clinical Decision Making Moderate    Rehab Potential Good    PT Frequency 2x / week    PT Duration 6 weeks    PT  Treatment/Interventions ADLs/Self Care Home Management;Cryotherapy;Electrical Stimulation;Iontophoresis 4mg /ml Dexamethasone;Moist Heat;Ultrasound;Therapeutic activities;Therapeutic exercise;Patient/family education;Manual techniques;Passive range of motion;Dry needling;Taping    PT Next Visit Plan heavy manual focus (1st rib and scalenes, UT), postural training and strengthening, did she bring picture of home office set up? ergonomics education    PT Home Exercise Plan [email or web address]    Consulted and Agree with Plan of Care Patient             Patient will benefit from skilled therapeutic intervention in order to improve the following deficits and impairments:  Decreased range of motion, Increased fascial restricitons, Increased muscle spasms, Impaired UE functional use, Decreased activity tolerance, Pain, Hypomobility, Impaired flexibility, Improper body mechanics, Decreased mobility, Decreased strength, Postural dysfunction  Visit Diagnosis: Abnormal posture  Muscle weakness (generalized)  Cramp and spasm     Problem List Patient Active Problem List   Diagnosis Date Noted   maternal ovarian cancer  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 [place], [place], [address] Phone: [phone]   Fax:  [phone]  Name: Erika Garrett MRN: [id number] Date of Birth: December 04, 1980

## 2021-03-03 ENCOUNTER — Encounter: Payer: Self-pay | Admitting: Physical Therapy

## 2021-03-03 ENCOUNTER — Other Ambulatory Visit: Payer: Self-pay

## 2021-03-03 ENCOUNTER — Ambulatory Visit: Payer: BC Managed Care – PPO | Admitting: Physical Therapy

## 2021-03-03 DIAGNOSIS — R252 Cramp and spasm: Secondary | ICD-10-CM

## 2021-03-03 DIAGNOSIS — R279 Unspecified lack of coordination: Secondary | ICD-10-CM

## 2021-03-03 DIAGNOSIS — R293 Abnormal posture: Secondary | ICD-10-CM | POA: Diagnosis not present

## 2021-03-03 DIAGNOSIS — M542 Cervicalgia: Secondary | ICD-10-CM | POA: Diagnosis not present

## 2021-03-03 DIAGNOSIS — M6281 Muscle weakness (generalized): Secondary | ICD-10-CM

## 2021-03-03 NOTE — Patient Instructions (Signed)
Access Code: [email or web address] URL: [email or web address] Date: 03/03/2021 Prepared by: Ethel Rana  Exercises Seated Scalene Stretch with Towel - 1 x daily - 7 x weekly - 3 sets - 15 hold Seated Scapular Retraction - 1 x daily - 7 x weekly - 10 reps - 10 hold Seated Upper Trapezius Stretch - 1 x daily - 7 x weekly - 3 sets Doorway Pec Stretch at 90 Degrees Abduction - 1 x daily - 7 x weekly - 3 sets - 20 hold Standing Shoulder External Rotation Stretch in Doorway - 1 x daily - 7 x weekly - 3 sets - 20 hold

## 2021-03-03 NOTE — Therapy (Signed)
[facility]. 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   Subjective Assessment - 03/03/21 1020     Subjective Patient reports that her neck was extremely sore yesterday morning. She usually sleeps on her L side. She slept on her R side and back last night and felt better this morning, but still sore. More tight and sore in posterior L neck than in the front like it was.    Patient Stated Goals get pain gone, be able to travel without difficulty    Currently in Pain? Yes    Pain Score --   patient reports more stiffness than pain today.                                       PT Education - 03/03/21 1056     Education Details Initial HEP.    Person(s) Educated Patient    Methods Explanation;Demonstration;Handout    Comprehension Returned demonstration;Verbalized understanding              PT Short Term Goals - 03/03/21 1101       PT SHORT TERM GOAL #1   Title Will be compliant with appropriate progressive HEP,  to be updated PRN    Baseline Initiated    Time 2    Period Weeks    Status On-going    Target Date 03/16/21      PT SHORT TERM GOAL #2   Title Will demonstrate more upright posture  as well as improved general postural awareness at least 50% of the time    Time 3    Period Weeks    Status New      PT SHORT TERM GOAL #3   Title Frequency and intensity of severe bouts of pain in cervical spine and LUE will improve by at least 50% to show improvement of condition    Time 3    Period Weeks    Status New      PT SHORT TERM GOAL #4   Title Will be able to name at least 3 ways to improve ergonomic set up of work station and 3 techniques to relieve stress    Time 3    Period Weeks    Status New  PT Long Term Goals - 02/23/21 1650       PT LONG TERM GOAL #1   Title MMT to improve to 5/5 in all weak groups to reduce pain    Time 6    Period Weeks    Status New    Target Date 04/06/21      PT LONG TERM GOAL #2   Title Will be able to perform all work related tasks for unlimited duration with cervical and LUE pain no more than 1/10 and no feelings of arm "falling asleep"    Time 6    Period Weeks    Status New      PT LONG TERM GOAL #3   Title Will be compliant with appropriate gym based exercise program to maintain functional gains and prevent recurrence of pain    Time 6    Period Weeks    Status New                   Plan - 03/03/21 1057     Clinical Impression Statement Patient reports improved scalene tightness, but reports increased tightness in L traps, LS. Performed additional STM and Initiated HEP for stretching.    Personal Factors and Comorbidities Age;Past/Current Experience;Profession;Social Background;Time since onset of injury/illness/exacerbation    Examination-Activity Limitations Reach Overhead;Carry;Caring for Others;Dressing;Lift    Examination-Participation Restrictions Occupation;Cleaning;Community  Activity;Driving;Interpersonal Relationship;Laundry;Shop;Yard Work    [employer] officer Evolving/Moderate complexity    Rehab Potential Good    PT Frequency 2x / week    PT Duration 6 weeks    PT Treatment/Interventions ADLs/Self Care Home Management;Cryotherapy;Electrical Stimulation;Iontophoresis 4mg /ml Dexamethasone;Moist Heat;Ultrasound;Therapeutic activities;Therapeutic exercise;Patient/family education;Manual techniques;Passive range of motion;Dry needling;Taping    PT Next Visit Plan Add trunk stability and postural strengthening to add to HEP. continue to assess scalenes, ergonomic assessment.    PT Home Exercise Plan [email or web address]    Consulted and Agree with Plan of Care Patient             Patient will benefit from skilled therapeutic intervention in order to improve the following deficits and impairments:  Decreased range of motion, Increased fascial restricitons, Increased muscle spasms, Impaired UE functional use, Decreased activity tolerance, Pain, Hypomobility, Impaired flexibility, Improper body mechanics, Decreased mobility, Decreased strength, Postural dysfunction  Visit Diagnosis: Abnormal posture  Muscle weakness (generalized)  Cramp and spasm  Unspecified lack of coordination     Problem List Patient Active Problem List   Diagnosis Date Noted   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 [place], [place], [address] Phone: [phone]   Fax:  [phone]  Name: Erika Garrett MRN: [id number] Date of Birth: October 15, 1980

## 2021-03-07 ENCOUNTER — Ambulatory Visit: Payer: BC Managed Care – PPO | Admitting: Physical Therapy

## 2021-03-07 ENCOUNTER — Other Ambulatory Visit: Payer: Self-pay

## 2021-03-07 ENCOUNTER — Encounter: Payer: Self-pay | Admitting: Physical Therapy

## 2021-03-07 DIAGNOSIS — M542 Cervicalgia: Secondary | ICD-10-CM | POA: Diagnosis not present

## 2021-03-07 DIAGNOSIS — M6281 Muscle weakness (generalized): Secondary | ICD-10-CM

## 2021-03-07 DIAGNOSIS — R293 Abnormal posture: Secondary | ICD-10-CM | POA: Diagnosis not present

## 2021-03-07 DIAGNOSIS — R252 Cramp and spasm: Secondary | ICD-10-CM | POA: Diagnosis not present

## 2021-03-07 DIAGNOSIS — R279 Unspecified lack of coordination: Secondary | ICD-10-CM | POA: Diagnosis not present

## 2021-03-07 NOTE — Therapy (Signed)
[place] [place], [place], [address] Phone: [phone]   Fax:  [phone]  Physical Therapy Treatment  Patient Details  Name: Erika Garrett MRN: [id number] Date of Birth: [birth year] Referring Provider (PT): Nani Ravens   Encounter Date: 03/07/2021   PT End of Session - 03/07/21 0941     Visit Number 4    Number of Visits 13    Date for PT Re-Evaluation 04/06/21    Authorization Type BCBS    Authorization Time Period 02/23/21 to 04/06/21    PT Start Time 0934    PT Stop Time 1015    PT Time Calculation (min) 41 min    Activity Tolerance Patient tolerated treatment well    Behavior During Therapy [facility] for tasks assessed/performed             Past Medical History:  Diagnosis Date   Allergy    Anxiety    Back pain    Bilateral swelling of feet    Constipation    Depression    Elevated liver enzymes    Endometriosis    Hyperlipidemia    hx of, no medications   Hypertension 2016   Intercranial Hypertension   Intracranial hypertension    Joint pain    Other fatigue    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   Subjective Assessment - 03/07/21 0935     Subjective I felt good after last session, knots are moving away from the front of my neck and more towards my upper trap. Trying not to sleep on that side as much which seems to be helping. Also tried switching pillows. Really having trouble with knot in my upper trap. Saw massage therapist, she thinks my mm in the back are overstretched and htat's why they are knotting    Currently in Pain? Yes    Pain Score --   "just tight and crunchy"                               [facility] Adult PT Treatment/Exercise - 03/07/21 0001       Neck Exercises: Seated   Lateral Flexion Both;10 reps    Lateral Flexion Limitations chin tuck    W Back 10 reps    W Back Limitations red TB 3 scond holds    Other Seated Exercise lats and rows 10# 1x10 focus on scap mm activity      Neck Exercises: Supine   Neck Retraction 10 reps;3 secs    Neck Retraction Limitations into pillow    Cervical Rotation 10 reps    Cervical Rotation Limitations with chin tuck    Other Supine Exercise SA punches 2# 1x10      Manual Therapy   Manual Therapy Soft tissue mobilization    Soft tissue mobilization subscap trigger point release, upper trap, postreior scalene and levator STM                     PT Education - 03/07/21 1025     Education Details scap retractions with  red TB at home    Person(s) Educated Patient    Methods Explanation    Comprehension Verbalized understanding;Returned demonstration              PT Short Term Goals - 03/03/21 1101       PT SHORT TERM GOAL #1   Title Will be compliant with appropriate progressive HEP, to be updated PRN    Baseline Initiated    Time 2    Period Weeks    Status On-going    Target Date 03/16/21      PT SHORT TERM GOAL #2   Title Will demonstrate more upright posture  as well as improved general postural awareness at least 50% of the time    Time 3    Period Weeks    Status New      PT SHORT TERM GOAL #3   Title Frequency and intensity of severe bouts of pain in cervical spine and LUE will improve by at least 50% to show improvement of condition    Time 3    Period Weeks    Status New      PT SHORT TERM GOAL #4   Title Will be able to name at least 3 ways to improve ergonomic set up of work station and 3 techniques to relieve stress    Time 3    Period Weeks    Status New               PT Long Term Goals - 02/23/21 1650       PT LONG TERM GOAL #1   Title  MMT to improve to 5/5 in all weak groups to reduce pain    Time 6    Period Weeks    Status New    Target Date 04/06/21      PT LONG TERM GOAL #2   Title Will be able to perform all work related tasks for unlimited duration with cervical and LUE pain no more than 1/10 and no feelings of arm "falling asleep"    Time 6    Period Weeks    Status New      PT LONG TERM GOAL #3   Title Will be compliant with appropriate gym based exercise program to maintain functional gains and prevent recurrence of pain    Time 6    Period Weeks    Status New                   Plan - 03/07/21 0942     Clinical Impression Statement Erika Garrett arrives today doing OK- having less tightness in anterior scalenes, more in upper traps and rhomboids today. Spent a little more time working on postural strength and mechanics, introduced weight machines today as well, tolerated OK, just generally very sore and tight. Still spent quite a bit of time on manual techniques today. Added scap retractions with red TB to HEP today, has very poor posture and UB strength which I think is contributing. Will continue to assess and progress as able.    Personal Factors and Comorbidities Age;Past/Current Experience;Profession;Social Background;Time since onset of injury/illness/exacerbation    Examination-Activity Limitations Reach Overhead;Carry;Caring for Others;Dressing;Lift    Examination-Participation Restrictions Occupation;Cleaning;Community Activity;Driving;Interpersonal Relationship;Laundry;Shop;Yard Work    [employer] officer Evolving/Moderate complexity    Clinical Decision Making Moderate    Rehab Potential Good    PT Frequency 2x / week    PT Duration 6 weeks    PT Treatment/Interventions ADLs/Self  Care Home Management;Cryotherapy;Electrical Stimulation;Iontophoresis 4mg /ml Dexamethasone;Moist Heat;Ultrasound;Therapeutic activities;Therapeutic exercise;Patient/family education;Manual  techniques;Passive range of motion;Dry needling;Taping    PT Next Visit Plan Add trunk stability and postural strengthening. continue to assess scalenes, ergonomic assessment.    PT Home Exercise Plan [id number] plus red TB scap retractions    Consulted and Agree with Plan of Care Patient             Patient will benefit from skilled therapeutic intervention in order to improve the following deficits and impairments:  Decreased range of motion, Increased fascial restricitons, Increased muscle spasms, Impaired UE functional use, Decreased activity tolerance, Pain, Hypomobility, Impaired flexibility, Improper body mechanics, Decreased mobility, Decreased strength, Postural dysfunction  Visit Diagnosis: Abnormal posture  Cramp and spasm  Muscle weakness (generalized)     Problem List Patient Active Problem List   Diagnosis Date Noted   maternal ovarian cancer  11/20/2019   Allergic contact dermatitis 08/23/2019   Chronic rhinitis 08/13/2019   Endometriosis of pelvis 06/25/2017   Subclinical hypothyroidism 06/25/2017   Obesity (BMI 30-39.9) 06/25/2017   Pseudotumor cerebri 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 [place], [place], [address] Phone: [phone]   Fax:  [phone]  Name: Sharell Hilmer MRN: [id number] Date of Birth: May 25, 1980

## 2021-03-10 ENCOUNTER — Ambulatory Visit: Payer: BC Managed Care – PPO | Admitting: Physical Therapy

## 2021-03-10 ENCOUNTER — Encounter: Payer: Self-pay | Admitting: Physical Therapy

## 2021-03-10 ENCOUNTER — Other Ambulatory Visit: Payer: Self-pay

## 2021-03-10 DIAGNOSIS — M6281 Muscle weakness (generalized): Secondary | ICD-10-CM | POA: Diagnosis not present

## 2021-03-10 DIAGNOSIS — R252 Cramp and spasm: Secondary | ICD-10-CM

## 2021-03-10 DIAGNOSIS — R293 Abnormal posture: Secondary | ICD-10-CM | POA: Diagnosis not present

## 2021-03-10 DIAGNOSIS — R279 Unspecified lack of coordination: Secondary | ICD-10-CM | POA: Diagnosis not present

## 2021-03-10 DIAGNOSIS — M542 Cervicalgia: Secondary | ICD-10-CM | POA: Diagnosis not present

## 2021-03-10 NOTE — Therapy (Signed)
[address] [place], [place], [address] Phone: [phone]   Fax:  [phone]  Physical Therapy Treatment  Patient Details  Name: Erika Garrett MRN: [id number] Date of Birth: [birth year] Referring Provider (PT): Nani Ravens   Encounter Date: 03/10/2021   PT End of Session - 03/10/21 1030     Visit Number 5    Number of Visits 13    Date for PT Re-Evaluation 04/06/21    Authorization Type BCBS    Authorization Time Period 02/23/21 to 04/06/21    PT Start Time 0933    PT Stop Time 1015    PT Time Calculation (min) 42 min    Activity Tolerance Patient tolerated treatment well    Behavior During Therapy [facility] for tasks assessed/performed;Anxious   spent lots of time reassuring and calming her today            Past Medical History:  Diagnosis Date   Allergy    Anxiety    Back pain    Bilateral swelling of feet    Constipation    Depression    Elevated liver enzymes    Endometriosis    Hyperlipidemia    hx of, no medications   Hypertension 2016   Intercranial Hypertension   Intracranial hypertension    Joint pain    Other fatigue    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   Subjective Assessment - 03/10/21 0949     Subjective I felt good after last session, band work helped  but I'm having some soreness and trigger points in my left pec close to my sternum. I was massaging this area and it felt stuck, its very bruised and sore now. Band work is feeling good but its making the muscles between my shoulder blades sore. My neck is feeling a lot better, no longer spaming under my collar bone. Have kept up with pec  stretches. It feels like the spasms move around, every week or two weeks it feels like its in a different spot.    Currently in Pain? Yes    Pain Score 4     Pain Location Chest    Pain Orientation Left;Medial;Anterior    Pain Descriptors / Indicators Aching    Pain Type Acute pain                               [facility] Adult PT Treatment/Exercise - 03/10/21 0001       Neck Exercises: Supine   Other Supine Exercise thoracic extensoins over towel with UEs overhead x2      Neck Exercises: Stretches   Other Neck Stretches practiced various versions of pec stretch over foam roller and in doorway      Manual Therapy   Manual Therapy Myofascial release    Joint Mobilization attempted rib mobs near intersection at sternum, unable to tolerate    Soft tissue mobilization intercostal MFR/trigger point release along L side of sternum  PT Education - 03/10/21 1029     Education Details needed extensive education and reassurance today that all symptoms still seem MSK in nature; education on symptoms she is having vs PE or post covid blood clot, as well as interconnected nature of shoulder girdle to t-spine and interconnectedness of rib cage to back and sternum, also education on various mm attachment points around sternum and neck/collar bone. Relationship of tight anterior mm groups and posterior over stretched groups and PT efforts to help correct mm imbalance    Person(s) Educated Patient    Methods Explanation    Comprehension Verbalized understanding              PT Short Term Goals - 03/03/21 1101       PT SHORT TERM GOAL #1   Title Will be compliant with appropriate progressive HEP, to be updated PRN    Baseline Initiated    Time 2    Period Weeks    Status On-going    Target Date 03/16/21      PT SHORT TERM GOAL #2   Title Will demonstrate more upright posture  as well as improved general postural awareness at least 50% of  the time    Time 3    Period Weeks    Status New      PT SHORT TERM GOAL #3   Title Frequency and intensity of severe bouts of pain in cervical spine and LUE will improve by at least 50% to show improvement of condition    Time 3    Period Weeks    Status New      PT SHORT TERM GOAL #4   Title Will be able to name at least 3 ways to improve ergonomic set up of work station and 3 techniques to relieve stress    Time 3    Period Weeks    Status New               PT Long Term Goals - 02/23/21 1650       PT LONG TERM GOAL #1   Title MMT to improve to 5/5 in all weak groups to reduce pain    Time 6    Period Weeks    Status New    Target Date 04/06/21      PT LONG TERM GOAL #2   Title Will be able to perform all work related tasks for unlimited duration with cervical and LUE pain no more than 1/10 and no feelings of arm "falling asleep"    Time 6    Period Weeks    Status New      PT LONG TERM GOAL #3   Title Will be compliant with appropriate gym based exercise program to maintain functional gains and prevent recurrence of pain    Time 6    Period Weeks    Status New                   Plan - 03/10/21 1031     Clinical Impression Statement Erika Garrett arrives today with a lot of anxiety about chest spasm and discomfort; per her description it sounds very MSK driven, per her report BP/HR/O2 are all WNL, she was also cleared for cardiac factors by referring MD before starting therapy. We tried some thoracic PAs today, also spent quite a bit of time working on intercostal release on L side of sternum paired with deep breathing to assist with intercostal MFR. Had good  improvement of pain/tight feelings after intercostal work. Also practiced thoracic extensions over rolled towel in supine , progressing pec stretches on foam roller, and adjusted pec door way stretch. Joints at rib-sternum intersection are VERY tight and not moving well but she was unable to tolerate rib mobs  today. Did discuss use of the PRN anti-inflammatory from her referring MD as well. Will see how she is feeling Monday, hopefully what we did today gives more lasting relief.    Personal Factors and Comorbidities Age;Past/Current Experience;Profession;Social Background;Time since onset of injury/illness/exacerbation    Examination-Activity Limitations Reach Overhead;Carry;Caring for Others;Dressing;Lift    Examination-Participation Restrictions Occupation;Cleaning;Community Activity;Driving;Interpersonal Relationship;Laundry;Shop;Yard Work    [employer] officer Evolving/Moderate complexity    Clinical Decision Making Moderate    Rehab Potential Good    PT Frequency 2x / week    PT Duration 6 weeks    PT Treatment/Interventions ADLs/Self Care Home Management;Cryotherapy;Electrical Stimulation;Iontophoresis 4mg /ml Dexamethasone;Moist Heat;Ultrasound;Therapeutic activities;Therapeutic exercise;Patient/family education;Manual techniques;Passive range of motion;Dry needling;Taping    PT Next Visit Plan how did she feel after instercostal MFR? How is it going at home? Ergonomics    PT Home Exercise Plan [id number] plus red TB scap retractions, pec stretch over foam roller, adjusted pec stretch in door way, thoracic extensions over towel with UE flexion    Consulted and Agree with Plan of Care Patient             Patient will benefit from skilled therapeutic intervention in order to improve the following deficits and impairments:  Decreased range of motion, Increased fascial restricitons, Increased muscle spasms, Impaired UE functional use, Decreased activity tolerance, Pain, Hypomobility, Impaired flexibility, Improper body mechanics, Decreased mobility, Decreased strength, Postural dysfunction  Visit Diagnosis: Abnormal posture  Cramp and spasm  Muscle weakness (generalized)  Unspecified lack of coordination     Problem List Patient Active Problem List   Diagnosis Date  Noted   maternal ovarian cancer  11/20/2019   Allergic contact dermatitis 08/23/2019   Chronic rhinitis 08/13/2019   Endometriosis of pelvis 06/25/2017   Subclinical hypothyroidism 06/25/2017   Obesity (BMI 30-39.9) 06/25/2017   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 [place], [place], [address] Phone: [phone]   Fax:  [phone]  Name: Erika Garrett MRN: [id number] Date of Birth: 15-Jun-1980

## 2021-03-13 ENCOUNTER — Other Ambulatory Visit: Payer: Self-pay

## 2021-03-13 ENCOUNTER — Ambulatory Visit: Payer: BC Managed Care – PPO | Admitting: Physical Therapy

## 2021-03-13 ENCOUNTER — Encounter: Payer: Self-pay | Admitting: Physical Therapy

## 2021-03-13 DIAGNOSIS — R293 Abnormal posture: Secondary | ICD-10-CM

## 2021-03-13 DIAGNOSIS — R252 Cramp and spasm: Secondary | ICD-10-CM | POA: Diagnosis not present

## 2021-03-13 DIAGNOSIS — R279 Unspecified lack of coordination: Secondary | ICD-10-CM

## 2021-03-13 DIAGNOSIS — M6281 Muscle weakness (generalized): Secondary | ICD-10-CM | POA: Diagnosis not present

## 2021-03-13 DIAGNOSIS — M542 Cervicalgia: Secondary | ICD-10-CM | POA: Diagnosis not present

## 2021-03-13 NOTE — Therapy (Signed)
[address]. [place], [place], [address] Phone: [phone]   Fax:  [phone]  Physical Therapy Treatment  Patient Details  Name: Erika Garrett MRN: [id number] Date of Birth: Oct 23, 1980 Referring Provider (PT): Nani Ravens   Encounter Date: 03/13/2021   PT End of Session - 03/13/21 1155     Visit Number 6    Number of Visits 13    Date for PT Re-Evaluation 04/06/21    Authorization Type BCBS    Authorization Time Period 02/23/21 to 04/06/21    PT Start Time 1104    PT Stop Time 1145    PT Time Calculation (min) 41 min    Activity Tolerance Patient tolerated treatment well    Behavior During Therapy [facility] for tasks assessed/performed;Anxious             Past Medical History:  Diagnosis Date   Allergy    Anxiety    Back pain    Bilateral swelling of feet    Constipation    Depression    Elevated liver enzymes    Endometriosis    Hyperlipidemia    hx of, no medications   Hypertension 2016   Intercranial Hypertension   Intracranial hypertension    Joint pain    Other fatigue    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   Subjective Assessment - 03/13/21 1105     Subjective I laid on a lacrosse ball this weekend, it seems to be the culprit as if I can get this loosened up it helps the rest. It really seemed to help. I've done the new things we were talking about too. My chest is a little sore after last time but feels like its relaxed a bit.    Patient Stated Goals get pain gone, be able to travel without difficulty    Currently in Pain? Yes    Pain Score 4     Pain Location Back    Pain Orientation  Left;Posterior    Pain Descriptors / Indicators Sore    Pain Type Acute pain                               [facility] Adult PT Treatment/Exercise - 03/13/21 0001       Neck Exercises: Standing   Other Standing Exercises B shoulder extension with scap retraction 2x5 10# B    Other Standing Exercises serratus punch in standing 5# 1x10      Neck Exercises: Seated   Other Seated Exercise lats and rows 10# 1x15 focus on scap mm activity      Manual Therapy   Manual Therapy Soft tissue mobilization;Joint mobilization    Joint Mobilization grade I rib mobs along L side of sternum, grade II-III mobs T10-C           STM thoracic paraspinals, rhomboids, levator and upper trap         PT Education - 03/13/21 1153     Education Details education of MSK nature of symptoms, mm imbalance with anterior and posterior shoulder girdle mm  groups, role of new exercises today in postural strengthening and possible benefits of thoracic and rib mobs    Person(s) Educated Patient    Methods Explanation    Comprehension Verbalized understanding              PT Short Term Goals - 03/03/21 1101       PT SHORT TERM GOAL #1   Title Will be compliant with appropriate progressive HEP, to be updated PRN    Baseline Initiated    Time 2    Period Weeks    Status On-going    Target Date 03/16/21      PT SHORT TERM GOAL #2   Title Will demonstrate more upright posture  as well as improved general postural awareness at least 50% of the time    Time 3    Period Weeks    Status New      PT SHORT TERM GOAL #3   Title Frequency and intensity of severe bouts of pain in cervical spine and LUE will improve by at least 50% to show improvement of condition    Time 3    Period Weeks    Status New      PT SHORT TERM GOAL #4   Title Will be able to name at least 3 ways to improve ergonomic set up of work station and 3 techniques to relieve stress    Time 3    Period Weeks     Status New               PT Long Term Goals - 02/23/21 1650       PT LONG TERM GOAL #1   Title MMT to improve to 5/5 in all weak groups to reduce pain    Time 6    Period Weeks    Status New    Target Date 04/06/21      PT LONG TERM GOAL #2   Title Will be able to perform all work related tasks for unlimited duration with cervical and LUE pain no more than 1/10 and no feelings of arm "falling asleep"    Time 6    Period Weeks    Status New      PT LONG TERM GOAL #3   Title Will be compliant with appropriate gym based exercise program to maintain functional gains and prevent recurrence of pain    Time 6    Period Weeks    Status New                   Plan - 03/13/21 1155     Clinical Impression Statement Erika Garrett arrives today feeling better, has added new HEP to her program and also has started some self-MFR with a lacrosse ball. Spent some time working on L thoracic paraspinal and rhomboid/levator spasms, also introduced thoracic PAs and continued working on L sternal intercostal spasms today; she was also able to tolerate gentle grade I rib mobs along L side of sternum today as well. Otherwise focused on postural strengthening today with addition and progression of activities. Still anxious about origin of symptoms, still benefitting from a lot of encouragement and reassurance about MSK presentation. Did well today, I think we are making slow but steady progress.    Personal Factors and Comorbidities Age;Past/Current Experience;Profession;Social Background;Time since onset of injury/illness/exacerbation    Examination-Activity Limitations Reach Overhead;Carry;Caring for Others;Dressing;Lift    Examination-Participation Restrictions Occupation;Cleaning;Community Activity;Driving;Interpersonal Relationship;Laundry;Shop;Yard Work    [employer] officer  Evolving/Moderate complexity    Clinical Decision Making Moderate    Rehab Potential Good    PT Frequency  2x / week    PT Duration 6 weeks    PT Treatment/Interventions ADLs/Self Care Home Management;Cryotherapy;Electrical Stimulation;Iontophoresis 4mg /ml Dexamethasone;Moist Heat;Ultrasound;Therapeutic activities;Therapeutic exercise;Patient/family education;Manual techniques;Passive range of motion;Dry needling;Taping    PT Next Visit Plan continue STM/MFR, thoracic mobs, rib mobs, intercostal release PRN. Progress postural strength as tolerated. Ergonomics    PT Home Exercise Plan [id number] plus red TB scap retractions, pec stretch over foam roller, adjusted pec stretch in door way, thoracic extensions over towel with UE flexion    Consulted and Agree with Plan of Care Patient             Patient will benefit from skilled therapeutic intervention in order to improve the following deficits and impairments:  Decreased range of motion, Increased fascial restricitons, Increased muscle spasms, Impaired UE functional use, Decreased activity tolerance, Pain, Hypomobility, Impaired flexibility, Improper body mechanics, Decreased mobility, Decreased strength, Postural dysfunction  Visit Diagnosis: Abnormal posture  Cramp and spasm  Muscle weakness (generalized)  Unspecified lack of coordination     Problem List Patient Active Problem List   Diagnosis Date Noted   maternal ovarian cancer  11/20/2019   Allergic contact dermatitis 08/23/2019   Chronic rhinitis 08/13/2019   Endometriosis of pelvis 06/25/2017   Subclinical hypothyroidism 06/25/2017   Obesity (BMI 30-39.9) 06/25/2017   Pseudotumor cerebri induced by 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 [place], [place], [address] Phone: [phone]   Fax:  [phone]  Name: Erika Garrett MRN: [id number] Date of Birth: 07/23/1980

## 2021-03-15 IMAGING — MR MR ABDOMEN WO/W CM
20 series · 48 of 48 positions shown · IV contrast (9 GADAVIST)
Comparison: 03/04/2019 abdominal sonogram. 09/06/2017 CT
abdomen/pelvis.

CLINICAL DATA: Indeterminate hypoechoic liver foci on recent
ultrasound performed for right abdominal pain.

EXAM:
MRI ABDOMEN WITHOUT AND WITH CONTRAST
TECHNIQUE: Multiplanar multisequence MR imaging of the abdomen was performed
both before and after the administration of intravenous contrast.
CONTRAST:  9mL GADAVIST GADOBUTROL 1 MMOL/ML IV SOLN

[Series 2: haste_cor_mbh · coronal · 6.0mm · 1.56mm/px · 1 of 39 slices shown]
[im 1/39]
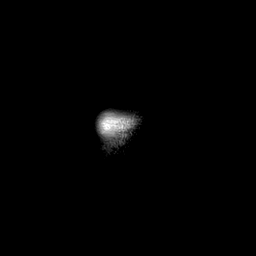

[Series 3: ax_trufi_mbh · axial · 6.0mm · 1.04mm/px · 1 of 42 slices shown]
[im 1/42]
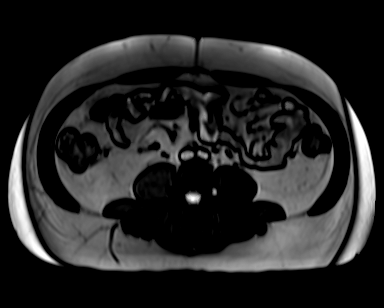

[Series 4: T2 fat-sat · axial · 6.0mm · 1.25mm/px · 1 of 36 slices shown]
[im 1/36]
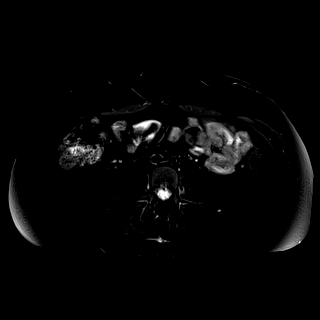

[Series 6: ax_diff_fb_tracew_dfc_mix · axial · 6.0mm · 1.49mm/px · 1 of 84 slices shown]
[im 1/84]
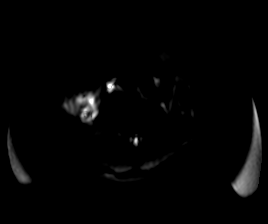

[Series 7: ax_diff_fb_adc_dfc_mix · axial · 6.0mm · 1.49mm/px · 1 of 42 slices shown]
[im 1/42]
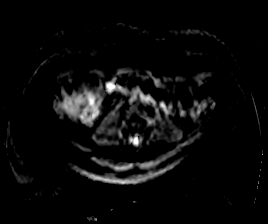

[Series 8: t1_vibe_e-dixon_tra_bh_pre_opp · axial · 3.0mm · 1.56mm/px · z∈[-78,+207]mm · 3 of 96 slices shown]
[im 1/96]
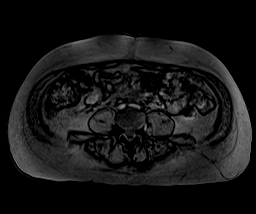
[im 48/96]
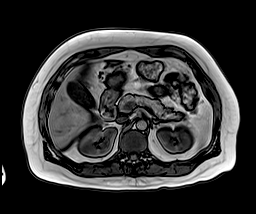
[im 96/96]
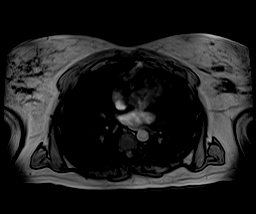

[Series 9: t1_vibe_e-dixon_tra_bh_pre_in · axial · 3.0mm · 1.56mm/px · z∈[-78,+207]mm · 3 of 96 slices shown]
[im 1/96]
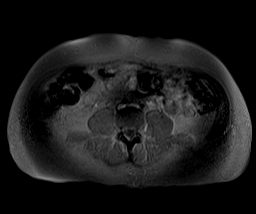
[im 48/96]
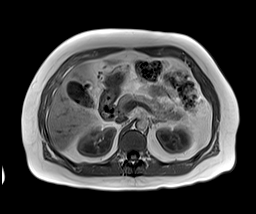
[im 96/96]
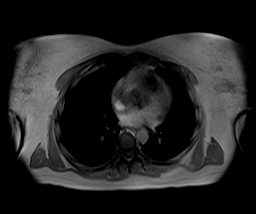

[Series 11: t1_vibe_e-dixon_tra_bh_pre_w · axial · 3.0mm · 1.56mm/px · z∈[-78,+207]mm · 3 of 96 slices shown]
[im 1/96]
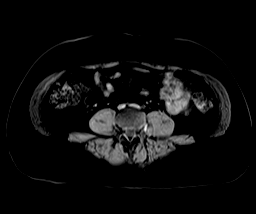
[im 48/96]
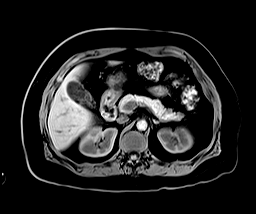
[im 96/96]
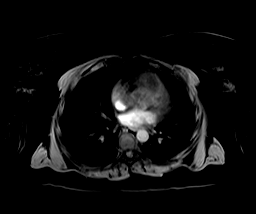

[Series 12: t1_vibe_e-dixon_tra_bh_pre_w_seg · axial · 3.0mm · 1.56mm/px · z∈[-78,+207]mm · 3 of 96 slices shown]
[im 1/96]
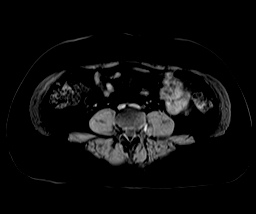
[im 48/96]
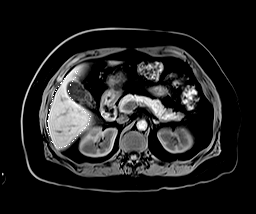
[im 96/96]
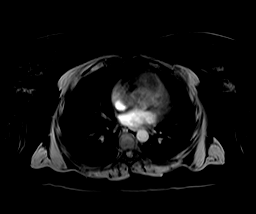

[Series 18: t1_vibe_e-dixon_tra_bh_pre_w_reg · axial · 3.0mm · 1.56mm/px · z∈[-78,+207]mm · 3 of 96 slices shown]
[im 1/96]
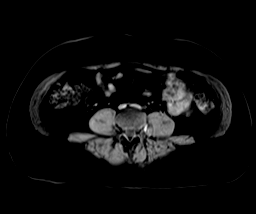
[im 48/96]
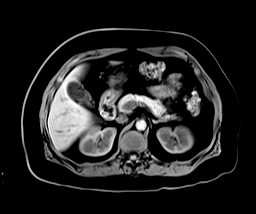
[im 96/96]
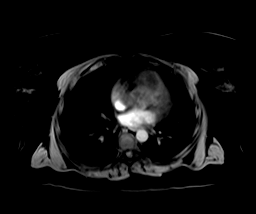

[Series 19: t1_vibe_dixon_tra_bh_arterial_w_reg · axial · 3.0mm · 1.56mm/px · z∈[-78,+207]mm · 3 of 96 slices shown]
[im 1/96]
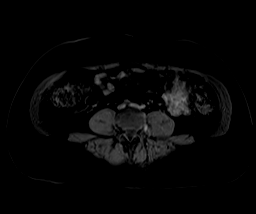
[im 48/96]
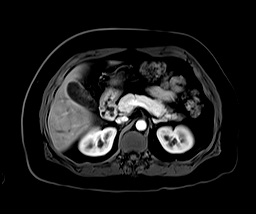
[im 96/96]
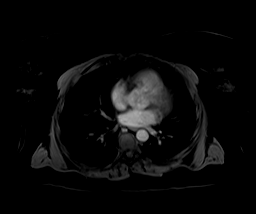

[Series 20: t1_vibe_dixon_tra_bh_arterial_w_sub · axial · 3.0mm · 1.56mm/px · z∈[-78,+207]mm · 3 of 96 slices shown]
[im 1/96]
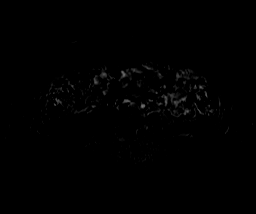
[im 48/96]
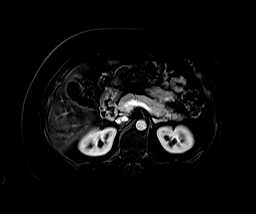
[im 96/96]
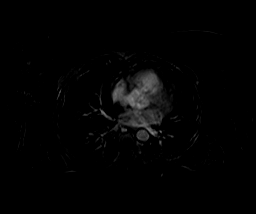

[Series 21: t1_vibe_dixon_tra_bh_arterial_w · axial · 3.0mm · 1.56mm/px · z∈[-78,+207]mm · 3 of 96 slices shown]
[im 1/96]
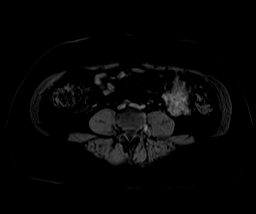
[im 48/96]
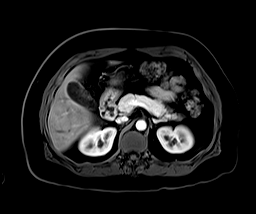
[im 96/96]
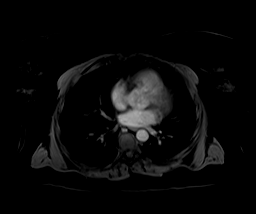

[Series 22: t1_vibe_dixon_tra_bh_venous_w · axial · 3.0mm · 1.56mm/px · z∈[-78,+207]mm · 3 of 96 slices shown]
[im 1/96]
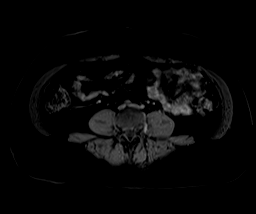
[im 48/96]
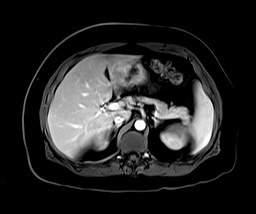
[im 96/96]
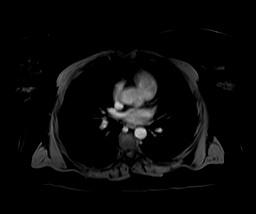

[Series 23: t2_trufi_tra_bh · axial · 6.0mm · 0.78mm/px · 1 of 40 slices shown]
[im 1/40]
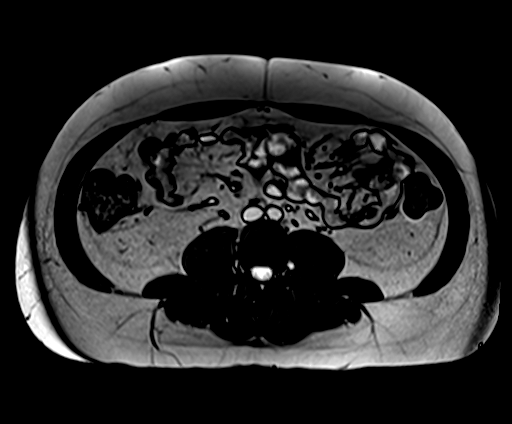

[Series 24: t1_vibe_dixon_tra_bh_delayed_w_reg · axial · 3.0mm · 1.56mm/px · z∈[-78,+207]mm · 3 of 96 slices shown]
[im 1/96]
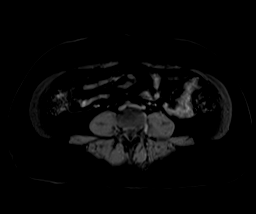
[im 48/96]
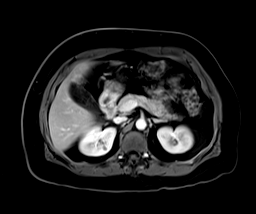
[im 96/96]
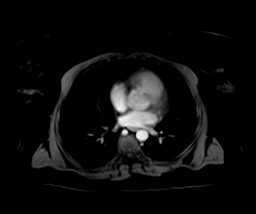

[Series 25: t1_vibe_dixon_tra_bh_delayed_w__sub · axial · 3.0mm · 1.56mm/px · z∈[-78,+207]mm · 3 of 96 slices shown]
[im 1/96]
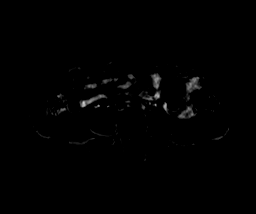
[im 48/96]
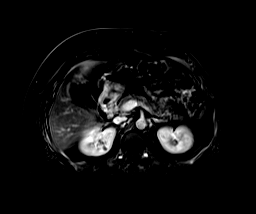
[im 96/96]
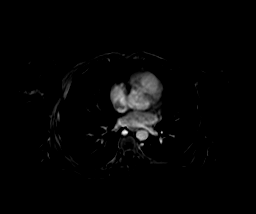

[Series 27: t1_vibe_dixon_cor_bh_post_w · coronal · 3.0mm · 2.01mm/px · 3 of 96 slices shown]
[im 1/96]
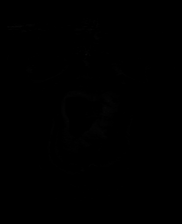
[im 48/96]
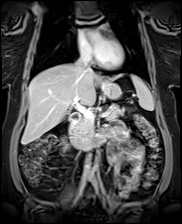
[im 96/96]
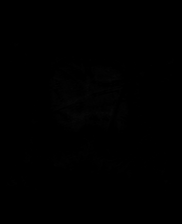

[Series 100: sub · axial · 3.0mm · 1.56mm/px · z∈[-78,+207]mm · 3 of 96 slices shown]
[im 1/96]
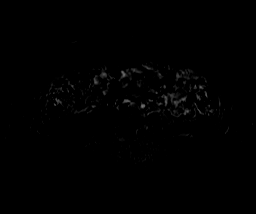
[im 48/96]
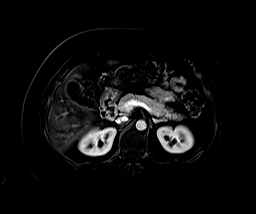
[im 96/96]
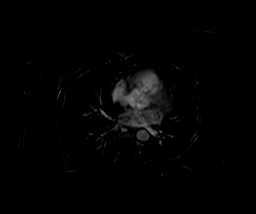

[Series 101: sub venous · axial · portal-venous · 3.0mm · 1.56mm/px · z∈[-78,+207]mm · 3 of 96 slices shown]
[im 1/96]
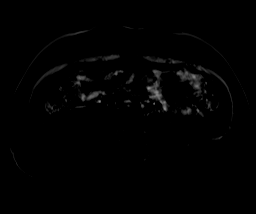
[im 48/96]
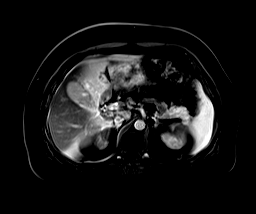
[im 96/96]
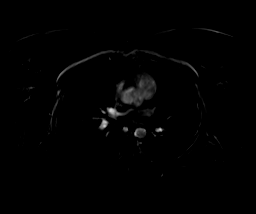

[48 of 48 positions shown; findings below may reference images not displayed]

FINDINGS: Lower chest: No acute abnormality at the lung bases.

Hepatobiliary: Normal liver size and configuration. No significant
hepatic steatosis. There is indistinct subcapsular focal fat in the
left liver lobe adjacent to porta hepatis measuring 1.7 x 0.9 cm,
correlating with one of the hypoechoic foci on the recent ultrasound
study. There is a 0.8 cm hemangioma in segment 7 right liver lobe
(series 24/image 22) with progressive nodular enhancement and faint
T2 hyperintensity, correlating with the other hypoechoic focus on
the recent ultrasound study. No additional liver lesions. Normal
gallbladder with no cholelithiasis. No biliary ductal dilatation.
Common bile duct diameter 2 mm. No choledocholithiasis. No biliary
strictures, masses or beading.

Pancreas: No pancreatic mass or duct dilation.  No pancreas divisum.

Spleen: Normal size. No mass.

Adrenals/Urinary Tract: Normal adrenals. No hydronephrosis.
Subcentimeter simple renal cortical cyst in the posterior upper
right kidney. No suspicious renal masses.

Stomach/Bowel: Small hiatal hernia. Otherwise normal nondistended
stomach. Visualized small and large bowel is normal caliber, with no
bowel wall thickening.

Vascular/Lymphatic: Normal caliber abdominal aorta. Patent portal,
splenic, hepatic and renal veins. No pathologically enlarged lymph
nodes in the abdomen.

Other: No abdominal ascites or focal fluid collection.

Musculoskeletal: No aggressive appearing focal osseous lesions.
IMPRESSION: 1. Small hemangioma in segment 7 right liver lobe and focal
subcapsular fat in the left liver lobe adjacent to the porta
hepatis, correlating with the hypoechoic foci on the recent
ultrasound study. No suspicious liver masses.
2. Small hiatal hernia.

## 2021-03-16 ENCOUNTER — Other Ambulatory Visit: Payer: Self-pay

## 2021-03-16 ENCOUNTER — Encounter: Payer: Self-pay | Admitting: Physical Therapy

## 2021-03-16 ENCOUNTER — Ambulatory Visit: Payer: BC Managed Care – PPO | Admitting: Physical Therapy

## 2021-03-16 DIAGNOSIS — R293 Abnormal posture: Secondary | ICD-10-CM

## 2021-03-16 DIAGNOSIS — M542 Cervicalgia: Secondary | ICD-10-CM | POA: Diagnosis not present

## 2021-03-16 DIAGNOSIS — R279 Unspecified lack of coordination: Secondary | ICD-10-CM | POA: Diagnosis not present

## 2021-03-16 DIAGNOSIS — M6281 Muscle weakness (generalized): Secondary | ICD-10-CM

## 2021-03-16 DIAGNOSIS — R252 Cramp and spasm: Secondary | ICD-10-CM

## 2021-03-16 NOTE — Therapy (Signed)
[place]. [place], [place], [address] Phone: [phone]   Fax:  [phone]  Physical Therapy Treatment  Patient Details  Name: Erika Garrett MRN: [id number] Date of Birth: Feb 18, 1980 Referring Provider (PT): Nani Ravens   Encounter Date: 03/16/2021   PT End of Session - 03/16/21 1358     Visit Number 7    Number of Visits 13    Date for PT Re-Evaluation 04/06/21    Authorization Type BCBS    Authorization Time Period 02/23/21 to 04/06/21    PT Start Time 1318    PT Stop Time 1357    PT Time Calculation (min) 39 min    Activity Tolerance Patient tolerated treatment well    Behavior During Therapy [facility] for tasks assessed/performed             Past Medical History:  Diagnosis Date   Allergy    Anxiety    Back pain    Bilateral swelling of feet    Constipation    Depression    Elevated liver enzymes    Endometriosis    Hyperlipidemia    hx of, no medications   Hypertension 2016   Intercranial Hypertension   Intracranial hypertension    Joint pain    Other fatigue    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   Subjective Assessment - 03/16/21 1319     Subjective I was sore after last time you saw me, the massage therapist worked on my intercostals more but in a different area. I had a tingling feeling coming from the back of my shoulder to the elbow, when I do my backwards shoulder rolls this went away, I think something may be pinched. Sometimes I feel like there's tingling when I press on my neck.    Patient Stated Goals get pain gone, be able to travel without difficulty    Currently in Pain? No/denies    more soreness than pain                              [facility] Adult PT Treatment/Exercise - 03/16/21 0001       Neck Exercises: Machines for Strengthening   Other Machines for Strengthening UBE 3 min forward/3 min backward L2    Other Machines for Strengthening shoulder extensions 10# 2x5; ER with 5# 1x10 B      Neck Exercises: Standing   Other Standing Exercises B shoulder extension with scap retraction 2x5 10# B      Manual Therapy   Manual Therapy Joint mobilization;Soft tissue mobilization;Myofascial release    Joint Mobilization grade II-III thoracic mobs T12-T2    Soft tissue mobilization thoracic paraspinals and rotator cuff, upper trap    Myofascial Release MFR to trigger point L rotator cuff            Rows 15# 1x10 with cables          PT Education - 03/16/21 1357     Education Details add shoulder extensions/ER/rows with band at home; trigger point release to jrotator cuff mm  Person(s) Educated Patient    Methods Explanation    Comprehension Verbalized understanding              PT Short Term Goals - 03/03/21 1101       PT SHORT TERM GOAL #1   Title Will be compliant with appropriate progressive HEP, to be updated PRN    Baseline Initiated    Time 2    Period Weeks    Status On-going    Target Date 03/16/21      PT SHORT TERM GOAL #2   Title Will demonstrate more upright posture  as well as improved general postural awareness at least 50% of the time    Time 3    Period Weeks    Status New      PT SHORT TERM GOAL #3   Title Frequency and intensity of severe bouts of pain in cervical spine and LUE will improve by at least 50% to show improvement of condition    Time 3    Period Weeks    Status New      PT SHORT TERM GOAL #4   Title Will be able to name at least 3 ways to improve ergonomic set up of work station and 3 techniques to relieve stress    Time 3    Period Weeks    Status New               PT  Long Term Goals - 02/23/21 1650       PT LONG TERM GOAL #1   Title MMT to improve to 5/5 in all weak groups to reduce pain    Time 6    Period Weeks    Status New    Target Date 04/06/21      PT LONG TERM GOAL #2   Title Will be able to perform all work related tasks for unlimited duration with cervical and LUE pain no more than 1/10 and no feelings of arm "falling asleep"    Time 6    Period Weeks    Status New      PT LONG TERM GOAL #3   Title Will be compliant with appropriate gym based exercise program to maintain functional gains and prevent recurrence of pain    Time 6    Period Weeks    Status New                   Plan - 03/16/21 1358     Clinical Impression Statement [name] arrives today feeling well, sounds like we did have some success in addressing symptoms last session. Opted for a similar session today, started with manual techniques then continued with postural stretching and strengthening today.  Has a large trigger point in L rotator cuff mm that I think is contributing to pain. Tolerated well, just has a lot of overstretched and weak muscles as well as other muscles that are more dominant and compensating. Left arm is much weaker likely from compensating for such a long time. I do think we are making steady progress.    Personal Factors and Comorbidities Age;Past/Current Experience;Profession;Social Background;Time since onset of injury/illness/exacerbation    Examination-Activity Limitations Reach Overhead;Carry;Caring for Others;Dressing;Lift    Examination-Participation Restrictions Occupation;Cleaning;Community Activity;Driving;Interpersonal Relationship;Laundry;Shop;Yard Work    [employer] Evolving/Moderate complexity    Clinical Decision Making Moderate    Rehab Potential Good    PT Frequency 2x / week    PT Duration 6 weeks  PT Treatment/Interventions ADLs/Self Care Home Management;Cryotherapy;Electrical  Stimulation;Iontophoresis 4mg /ml Dexamethasone;Moist Heat;Ultrasound;Therapeutic activities;Therapeutic exercise;Patient/family education;Manual techniques;Passive range of motion;Dry needling;Taping    PT Next Visit Plan continue STM/MFR, thoracic mobs, rib mobs, intercostal release and L rotator cuff MFR PRN. Progress postural strength as tolerated. Ergonomics    PT Home Exercise Plan [id number] plus red TB scap retractions, pec stretch over foam roller, adjusted pec stretch in door way, thoracic extensions over towel with UE flexion, shoulder extension/rows/ER with bands    Consulted and Agree with Plan of Care Patient             Patient will benefit from skilled therapeutic intervention in order to improve the following deficits and impairments:  Decreased range of motion, Increased fascial restricitons, Increased muscle spasms, Impaired UE functional use, Decreased activity tolerance, Pain, Hypomobility, Impaired flexibility, Improper body mechanics, Decreased mobility, Decreased strength, Postural dysfunction  Visit Diagnosis: Abnormal posture  Cramp and spasm  Muscle weakness (generalized)     Problem List Patient Active Problem List   Diagnosis Date Noted   maternal ovarian cancer  11/20/2019   Allergic contact dermatitis 08/23/2019   Chronic rhinitis 08/13/2019   Endometriosis of pelvis 06/25/2017   Subclinical hypothyroidism 06/25/2017   Obesity (BMI 30-39.9) 06/25/2017   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 [place], [place], [address] Phone: [phone]   Fax:  [phone]  Name: Erika Garrett MRN: [id number] Date of Birth: 09-03-1980

## 2021-03-17 DIAGNOSIS — N6341 Unspecified lump in right breast, subareolar: Secondary | ICD-10-CM | POA: Diagnosis not present

## 2021-03-17 DIAGNOSIS — Z1231 Encounter for screening mammogram for malignant neoplasm of breast: Secondary | ICD-10-CM | POA: Diagnosis not present

## 2021-03-20 ENCOUNTER — Encounter: Payer: Self-pay | Admitting: Physical Therapy

## 2021-03-20 ENCOUNTER — Other Ambulatory Visit: Payer: Self-pay

## 2021-03-20 ENCOUNTER — Ambulatory Visit: Payer: BC Managed Care – PPO | Admitting: Physical Therapy

## 2021-03-20 DIAGNOSIS — R293 Abnormal posture: Secondary | ICD-10-CM

## 2021-03-20 DIAGNOSIS — R279 Unspecified lack of coordination: Secondary | ICD-10-CM | POA: Diagnosis not present

## 2021-03-20 DIAGNOSIS — M6281 Muscle weakness (generalized): Secondary | ICD-10-CM

## 2021-03-20 DIAGNOSIS — R252 Cramp and spasm: Secondary | ICD-10-CM | POA: Diagnosis not present

## 2021-03-20 DIAGNOSIS — M542 Cervicalgia: Secondary | ICD-10-CM | POA: Diagnosis not present

## 2021-03-20 NOTE — Therapy (Signed)
[place]. [place], [place], [address] Phone: [phone]   Fax:  [phone]  Physical Therapy Treatment  Patient Details  Name: Erika Garrett MRN: [id number] Date of Birth: [birth year] Referring Provider (PT): Nani Ravens   Encounter Date: 03/20/2021   PT End of Session - 03/20/21 1102     Visit Number 8    Number of Visits 13    Date for PT Re-Evaluation 04/06/21    Authorization Type [employer]BS    Authorization Time Period 02/23/21 to 04/06/21    PT Start Time 1018    PT Stop Time 1057    PT Time Calculation (min) 39 min    Activity Tolerance Patient tolerated treatment well    Behavior During Therapy [facility] for tasks assessed/performed             Past Medical History:  Diagnosis Date   Allergy    Anxiety    Back pain    Bilateral swelling of feet    Constipation    Depression    Elevated liver enzymes    Endometriosis    Hyperlipidemia    hx of, no medications   Hypertension 2016   Intercranial Hypertension   Intracranial hypertension    Joint pain    Other fatigue    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   Subjective Assessment - 03/20/21 1019     Subjective I've not felt any more discomfort in my chest, I have woken up with dead arm/numb arm sort of feelings the past 2 nights, I have to shake and move it to get it going again. Its very achey, very constant not shooting pain. More the side and back of my neck that are tight than the front.    Patient Stated Goals get pain gone, be able to travel without difficulty    Currently in Pain? No/denies   not really pain just aching                               [facility] Adult PT Treatment/Exercise - 03/20/21 0001       Neck Exercises: Machines for Strengthening   Other Machines for Strengthening ER 5# 1x10; backwards diagonal (high to low, IR to ER) 1x10 5#      Neck Exercises: Seated   Other Seated Exercise UBE level 2.5, 3 min forward/3 min back    Other Seated Exercise rows and lats 10# 1x15, then 1x7 15#      Manual Therapy   Manual Therapy Soft tissue mobilization    Soft tissue mobilization L thoracic paraspinals, L rotator cuff, suboccipitals and middle/posteroir scalenes, upper trap                     PT Education - 03/20/21 1101     Education Details general education on mm balance and how we work to correct it, add green band to HEP/progressions away from red band    Person(s) Educated Patient    Methods Explanation    Comprehension Verbalized understanding  PT Short Term Goals - 03/03/21 1101       PT SHORT TERM GOAL #1   Title Will be compliant with appropriate progressive HEP, to be updated PRN    Baseline Initiated    Time 2    Period Weeks    Status On-going    Target Date 03/16/21      PT SHORT TERM GOAL #2   Title Will demonstrate more upright posture  as well as improved general postural awareness at least 50% of the time    Time 3    Period Weeks    Status New      PT SHORT TERM GOAL #3   Title Frequency and intensity of severe bouts of pain in cervical spine and LUE will improve by at least 50% to show improvement of condition    Time 3    Period Weeks    Status New      PT SHORT TERM GOAL #4   Title Will be able to name at least 3 ways to improve ergonomic set up of work station and 3 techniques to relieve stress    Time 3    Period Weeks    Status New               PT Long Term Goals - 02/23/21 1650       PT LONG TERM GOAL #1   Title MMT to improve to 5/5 in all weak groups to reduce pain    Time 6    Period Weeks     Status New    Target Date 04/06/21      PT LONG TERM GOAL #2   Title Will be able to perform all work related tasks for unlimited duration with cervical and LUE pain no more than 1/10 and no feelings of arm "falling asleep"    Time 6    Period Weeks    Status New      PT LONG TERM GOAL #3   Title Will be compliant with appropriate gym based exercise program to maintain functional gains and prevent recurrence of pain    Time 6    Period Weeks    Status New                   Plan - 03/20/21 1102     Clinical Impression Statement Naryah arrives today continuing to feel better, still having a lot of soreness and tenderness especially in L rotator cuff. Continued with manual techniques and general postural strengthening , she seems to be responding well to this so far. Progressed resistance as able and tolerated. I do think rotator cuff mm groups may be overstretched and weak which is contributing to pain. Will continue to progress as able and tolerated.    Personal Factors and Comorbidities Age;Past/Current Experience;Profession;Social Background;Time since onset of injury/illness/exacerbation    Examination-Activity Limitations Reach Overhead;Carry;Caring for Others;Dressing;Lift    Examination-Participation Restrictions Occupation;Cleaning;Community Activity;Driving;Interpersonal Relationship;Laundry;Shop;Yard Work    [employer] officer Evolving/Moderate complexity    Clinical Decision Making Moderate    Rehab Potential Good    PT Frequency 2x / week    PT Duration 6 weeks    PT Treatment/Interventions ADLs/Self Care Home Management;Cryotherapy;Electrical Stimulation;Iontophoresis 4mg /ml Dexamethasone;Moist Heat;Ultrasound;Therapeutic activities;Therapeutic exercise;Patient/family education;Manual techniques;Passive range of motion;Dry needling;Taping    PT Next Visit Plan continue STM/MFR, thoracic mobs, rib mobs, intercostal release and L rotator cuff MFR PRN.  Progress postural strength as tolerated. Ergonomics    PT Home  Exercise Plan [id number] plus red TB scap retractions, pec stretch over foam roller, adjusted pec stretch in door way, thoracic extensions over towel with UE flexion, shoulder extension/rows/ER with bands    Consulted and Agree with Plan of Care Patient             Patient will benefit from skilled therapeutic intervention in order to improve the following deficits and impairments:  Decreased range of motion, Increased fascial restricitons, Increased muscle spasms, Impaired UE functional use, Decreased activity tolerance, Pain, Hypomobility, Impaired flexibility, Improper body mechanics, Decreased mobility, Decreased strength, Postural dysfunction  Visit Diagnosis: Abnormal posture  Cramp and spasm  Muscle weakness (generalized)     Problem List Patient Active Problem List   Diagnosis Date Noted   maternal ovarian cancer  11/20/2019   Allergic contact dermatitis 08/23/2019   Chronic rhinitis 08/13/2019   Endometriosis of pelvis 06/25/2017   Subclinical hypothyroidism 06/25/2017   Obesity (BMI 30-39.9) 06/25/2017   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 [place], [place], [address] Phone: [phone]   Fax:  [phone]  Name: Jaeleigh Monaco MRN: [id number] Date of Birth: 27-Jan-1980

## 2021-03-23 ENCOUNTER — Other Ambulatory Visit: Payer: Self-pay

## 2021-03-23 ENCOUNTER — Encounter: Payer: Self-pay | Admitting: Physical Therapy

## 2021-03-23 ENCOUNTER — Ambulatory Visit: Payer: BC Managed Care – PPO | Attending: Family Medicine | Admitting: Physical Therapy

## 2021-03-23 DIAGNOSIS — M6281 Muscle weakness (generalized): Secondary | ICD-10-CM | POA: Diagnosis not present

## 2021-03-23 DIAGNOSIS — R252 Cramp and spasm: Secondary | ICD-10-CM | POA: Insufficient documentation

## 2021-03-23 DIAGNOSIS — R293 Abnormal posture: Secondary | ICD-10-CM | POA: Diagnosis not present

## 2021-03-23 NOTE — Therapy (Signed)
[facility] ?[place] ?[address] ?[place], [place], [address] ?Phone: [phone]   Fax:  [phone] ? ?Physical Therapy Treatment ? ?Patient Details  ?Name: Erika Garrett ?MRN: [id number] ?Date of Birth: April 17, 1980 ?Referring Provider (PT): Wendling ? ? ?Encounter Date: 03/23/2021 ? ? PT End of Session - 03/23/21 1021   ? ? Visit Number 9   ? Number of Visits 13   ? Date for PT Re-Evaluation 04/06/21   ? Authorization Type BCBS   ? Authorization Time Period 02/23/21 to 04/06/21   ? PT Start Time 3007   ? PT Stop Time 1012   ? PT Time Calculation (min) 40 min   ? Activity Tolerance Patient tolerated treatment well   ? Behavior During Therapy [facility] for tasks assessed/performed   ? ?  ?  ? ?  ? ? ?Past Medical History:  ?Diagnosis Date  ? Allergy   ? Anxiety   ? Back pain   ? Bilateral swelling of feet   ? Constipation   ? Depression   ? Elevated liver enzymes   ? Endometriosis   ? Hyperlipidemia   ? hx of, no medications  ? Hypertension 2016  ? Intercranial Hypertension  ? Intracranial hypertension   ? Joint pain   ? Other fatigue   ? 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 Shortness of breath on exertion   ? Subclinical hypothyroidism   ? Ventral hernia   ? Vitamin D deficiency   ? ? ?Past Surgical History:  ?Procedure Laterality Date  ? ABLATION ON ENDOMETRIOSIS    ? ROBOTIC ASSISTED LAPAROSCOPIC PARTIAL CYSTECTOMY    ? ? ?There were no vitals filed for this visit. ? ? Subjective Assessment - 03/23/21 0934   ? ? Subjective I was sore after last session, I think we have it quarantined to my rotator cuff and tricep. My massage therapist was working on this area and it was very tight. Glad discomfort and pain is out of chest and the front of my neck.   ? Patient Stated Goals get pain gone, be able to travel without difficulty   ? Currently in Pain? No/denies   sore  ? ?  ?  ? ?  ? ? ? ? ? ? ? ? ? ? ? ? ? ? ? ? ? ? ? ? [facility] Adult PT Treatment/Exercise - 03/23/21 0001   ? ?   ? Neck Exercises: Machines for Strengthening  ? Other Machines for Strengthening UBE 3 min forward/3 min backward L 2.5   ? Other Machines for Strengthening shoulder extensions 10# 1x10 then 1x5 with 15#; cable rows 10# x10 then 15#x5; shoulder ER 5# 1x10 B   ?  ? Manual Therapy  ? Manual Therapy Soft tissue mobilization   ? Soft tissue mobilization L posterior rotator cuff and medial tricep   ? ?  ?  ? ?  ? ? ? ? ? ? ? ? ? ? PT Education - 03/23/21 1021   ? ? Education Details progress thus far, tricep foam rolling, continued education on managing mm and postural imbalance   ? Person(s) Educated Patient   ? Methods Explanation   ? Comprehension Verbalized understanding   ? ?  ?  ? ?  ? ? ? PT Short Term Goals - 03/03/21 1101   ? ?  ? PT SHORT TERM GOAL #1  ? Title Will be compliant with appropriate progressive HEP, to be updated PRN   ?  Baseline Initiated   ? Time 2   ? Period Weeks   ? Status On-going   ? Target Date 03/16/21   ?  ? PT SHORT TERM GOAL #2  ? Title Will demonstrate more upright posture  as well as improved general postural awareness at least 50% of the time   ? Time 3   ? Period Weeks   ? Status New   ?  ? PT SHORT TERM GOAL #3  ? Title Frequency and intensity of severe bouts of pain in cervical spine and LUE will improve by at least 50% to show improvement of condition   ? Time 3   ? Period Weeks   ? Status New   ?  ? PT SHORT TERM GOAL #4  ? Title Will be able to name at least 3 ways to improve ergonomic set up of work station and 3 techniques to relieve stress   ? Time 3   ? Period Weeks   ? Status New   ? ?  ?  ? ?  ? ? ? ? PT Long Term Goals - 02/23/21 1650   ? ?  ? PT LONG TERM GOAL #1  ? Title MMT to improve to 5/5 in all weak groups to reduce pain   ? Time 6   ? Period Weeks   ? Status New   ? Target Date 04/06/21   ?  ? PT LONG TERM GOAL #2  ? Title Will be able to perform all work related tasks for unlimited duration with cervical and LUE pain no more than 1/10 and no feelings of arm  "falling asleep"   ? Time 6   ? Period Weeks   ? Status New   ?  ? PT LONG TERM GOAL #3  ? Title Will be compliant with appropriate gym based exercise program to maintain functional gains and prevent recurrence of pain   ? Time 6   ? Period Weeks   ? Status New   ? ?  ?  ? ?  ? ? ? ? ? ? ? ? Plan - 03/23/21 1022   ? ? Clinical Impression Statement Vanecia arrives today feeling much better- we seem to have isolated her pain to L rotator cuff and tricep. Still spent some time with STM mostly on L rotator cuff and tricep today; she does have a lot of trigger points in medial tricep which I was able to somewhat resolve but I do think that this is definitely still contributing to her pain. Continued with strength training today as well. She is doing really well keeping up with her HEP at home as well, we talked about triceps foam rolling today too. I think we are really starting to build some momentum with recovery.   ? Personal Factors and Comorbidities Age;Past/Current Experience;Profession;Social Background;Time since onset of injury/illness/exacerbation   ? Examination-Activity Limitations Reach Overhead;Carry;Caring for Others;Dressing;Lift   ? Examination-Participation Restrictions Occupation;Cleaning;Community Activity;Driving;Interpersonal Relationship;Laundry;Shop;Yard Work   ? Stability/Clinical Decision Making Evolving/Moderate complexity   ? Clinical Decision Making Moderate   ? Rehab Potential Good   ? PT Frequency 2x / week   ? PT Duration 6 weeks   ? PT Treatment/Interventions ADLs/Self Care Home Management;Cryotherapy;Electrical Stimulation;Iontophoresis 4mg /ml Dexamethasone;Moist Heat;Ultrasound;Therapeutic activities;Therapeutic exercise;Patient/family education;Manual techniques;Passive range of motion;Dry needling;Taping   ? PT Next Visit Plan continue STM/MFR, thoracic mobs, rib mobs, intercostal release and L rotator cuff MFR PRN. Progress postural strength as tolerated. Ergonomics   ? PT Home Exercise  Plan [id number] plus red TB scap retractions, pec stretch over foam roller, adjusted pec stretch in door way, thoracic extensions over towel with UE flexion, shoulder extension/rows/ER with bands, tricep foam rolling   ? Consulted and Agree with Plan of Care Patient   ? ?  ?  ? ?  ? ? ?Patient will benefit from skilled therapeutic intervention in order to improve the following deficits and impairments:  Decreased range of motion, Increased fascial restricitons, Increased muscle spasms, Impaired UE functional use, Decreased activity tolerance, Pain, Hypomobility, Impaired flexibility, Improper body mechanics, Decreased mobility, Decreased strength, Postural dysfunction ? ?Visit Diagnosis: ?Abnormal posture ? ?Cramp and spasm ? ?Muscle weakness (generalized) ? ? ? ? ?Problem List ?Patient Active Problem List  ? Diagnosis Date Noted  ? maternal ovarian cancer  11/20/2019  ? Allergic contact dermatitis 08/23/2019  ? Chronic rhinitis 08/13/2019  ? Endometriosis of pelvis 06/25/2017  ? Subclinical hypothyroidism 06/25/2017  ? Obesity (BMI 30-39.9) 06/25/2017  ? 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 ?Xiomara Sevillano U PT, DPT, PN2  ? ?Supplemental Physical Therapist ?[facility]  ? ? ? ? ? ?[facility] ?[place] ?[address] ?[place], [place], [address] ?Phone: [phone]   Fax:  [phone] ? ?Name: Jenesys Casseus ?MRN: [id number] ?Date of Birth: 31-Jul-1980 ? ? ? ?

## 2021-03-27 ENCOUNTER — Other Ambulatory Visit: Payer: Self-pay

## 2021-03-27 ENCOUNTER — Ambulatory Visit: Payer: BC Managed Care – PPO | Admitting: Physical Therapy

## 2021-03-27 ENCOUNTER — Encounter: Payer: Self-pay | Admitting: Physical Therapy

## 2021-03-27 DIAGNOSIS — R252 Cramp and spasm: Secondary | ICD-10-CM | POA: Diagnosis not present

## 2021-03-27 DIAGNOSIS — M6281 Muscle weakness (generalized): Secondary | ICD-10-CM | POA: Diagnosis not present

## 2021-03-27 DIAGNOSIS — R293 Abnormal posture: Secondary | ICD-10-CM | POA: Diagnosis not present

## 2021-03-27 NOTE — Therapy (Signed)
[facility] ?[place] ?[address] ?[place], [place], [address] ?Phone: [phone]   Fax:  [phone] ? ?Physical Therapy Treatment ? ?Patient Details  ?Name: Erika Garrett ?MRN: [id number] ?Date of Birth: 05-31-1980 ?Referring Provider (PT): Wendling ? ? ?Encounter Date: 03/27/2021 ? ? PT End of Session - 03/27/21 1015   ? ? Visit Number 10   ? Number of Visits 13   ? Date for PT Re-Evaluation 04/06/21   ? Authorization Type BCBS   ? Authorization Time Period 02/23/21 to 04/06/21   ? PT Start Time [phone]   ? PT Stop Time 1013   ? PT Time Calculation (min) 39 min   ? Activity Tolerance Patient tolerated treatment well   ? Behavior During Therapy [facility] for tasks assessed/performed   ? ?  ?  ? ?  ? ? ?Past Medical History:  ?Diagnosis Date  ? Allergy   ? Anxiety   ? Back pain   ? Bilateral swelling of feet   ? Constipation   ? Depression   ? Elevated liver enzymes   ? Endometriosis   ? Hyperlipidemia   ? hx of, no medications  ? Hypertension 2016  ? Intercranial Hypertension  ? Intracranial hypertension   ? Joint pain   ? Other fatigue   ? 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 Shortness of breath on exertion   ? Subclinical hypothyroidism   ? Ventral hernia   ? Vitamin D deficiency   ? ? ?Past Surgical History:  ?Procedure Laterality Date  ? ABLATION ON ENDOMETRIOSIS    ? ROBOTIC ASSISTED LAPAROSCOPIC PARTIAL CYSTECTOMY    ? ? ?There were no vitals filed for this visit. ? ? Subjective Assessment - 03/27/21 0935   ? ? Subjective My pain is still quarantined to that one spot in my rhomboid and rotator cuff, tricep is doing better. It spasmed yesterday. Still very achey and has been spasming. I had a good sore feeling after last time but I can't get comfortable in my sleep.   ? Patient Stated Goals get pain gone, be able to travel without difficulty   ? Currently in Pain? No/denies   sore and achey  ? ?  ?  ? ?  ? ? ? ? ? ? ? ? ? ? ? ? ? ? ? ? ? ? ? ? [facility] Adult PT  Treatment/Exercise - 03/27/21 0001   ? ?  ? Neck Exercises: Machines for Strengthening  ? Other Machines for Strengthening UBE 3 min forward/3 min backward L 3   ? Other Machines for Strengthening shoulder extensions 15# 2x5; serratus punches 1x10 B 10#; cable 2x5 15#; bicep curl to OH press 5# 1x10   ?  ? Manual Therapy  ? Manual Therapy Soft tissue mobilization   ? Joint Mobilization grade II-III thoracic mobs T12-T2   ? Soft tissue mobilization L posterior rotator cuff, L rhomboid, and medial tricep   ? ?  ?  ? ?  ? ? ? ? ? ? ? ? ? ? PT Education - 03/27/21 1014   ? ? Education Details exericse form/purpose, anatomy of sore region   ? Person(s) Educated Patient   ? Methods Explanation   ? Comprehension Verbalized understanding   ? ?  ?  ? ?  ? ? ? PT Short Term Goals - 03/03/21 1101   ? ?  ? PT SHORT TERM GOAL #1  ? Title Will be compliant with appropriate progressive  HEP, to be updated PRN   ? Baseline Initiated   ? Time 2   ? Period Weeks   ? Status On-going   ? Target Date 03/16/21   ?  ? PT SHORT TERM GOAL #2  ? Title Will demonstrate more upright posture  as well as improved general postural awareness at least 50% of the time   ? Time 3   ? Period Weeks   ? Status New   ?  ? PT SHORT TERM GOAL #3  ? Title Frequency and intensity of severe bouts of pain in cervical spine and LUE will improve by at least 50% to show improvement of condition   ? Time 3   ? Period Weeks   ? Status New   ?  ? PT SHORT TERM GOAL #4  ? Title Will be able to name at least 3 ways to improve ergonomic set up of work station and 3 techniques to relieve stress   ? Time 3   ? Period Weeks   ? Status New   ? ?  ?  ? ?  ? ? ? ? PT Long Term Goals - 02/23/21 1650   ? ?  ? PT LONG TERM GOAL #1  ? Title MMT to improve to 5/5 in all weak groups to reduce pain   ? Time 6   ? Period Weeks   ? Status New   ? Target Date 04/06/21   ?  ? PT LONG TERM GOAL #2  ? Title Will be able to perform all work related tasks for unlimited duration with  cervical and LUE pain no more than 1/10 and no feelings of arm "falling asleep"   ? Time 6   ? Period Weeks   ? Status New   ?  ? PT LONG TERM GOAL #3  ? Title Will be compliant with appropriate gym based exercise program to maintain functional gains and prevent recurrence of pain   ? Time 6   ? Period Weeks   ? Status New   ? ?  ?  ? ?  ? ? ? ? ? ? ? ? Plan - 03/27/21 1015   ? ? Clinical Impression Statement Guiselle arrives today doing OK, still having a lot of stiffness and aching medial and upper shoulder blade. We continued with manual techniques and general postural and shoulder strengthening today. Supportive mm groups such as anterior serratus and rotator cuff remain quite weak and easily fatigued. She brought a picture of her workstation today, ergonomics as is actually look pretty good but we did discuss ideal desk height. Will continue efforts.   ? Personal Factors and Comorbidities Age;Past/Current Experience;Profession;Social Background;Time since onset of injury/illness/exacerbation   ? Examination-Activity Limitations Reach Overhead;Carry;Caring for Others;Dressing;Lift   ? Examination-Participation Restrictions Occupation;Cleaning;Community Activity;Driving;Interpersonal Relationship;Laundry;Shop;Yard Work   ? Stability/Clinical Decision Making Evolving/Moderate complexity   ? Clinical Decision Making Moderate   ? Rehab Potential Good   ? PT Frequency 2x / week   ? PT Duration 6 weeks   ? PT Treatment/Interventions ADLs/Self Care Home Management;Cryotherapy;Electrical Stimulation;Iontophoresis '4mg'$ /ml Dexamethasone;Moist Heat;Ultrasound;Therapeutic activities;Therapeutic exercise;Patient/family education;Manual techniques;Passive range of motion;Dry needling;Taping   ? PT Next Visit Plan continue STM/MFR, thoracic mobs, rib mobs, intercostal release and L rotator cuff MFR PRN. Progress postural strength as tolerated. Ergonomics   ? PT Home Exercise Plan [id number] plus red TB scap retractions, pec stretch  over foam roller, adjusted pec stretch in door way, thoracic extensions over towel with UE flexion,  shoulder extension/rows/ER with bands, tricep foam rolling   ? Consulted and Agree with Plan of Care Patient   ? ?  ?  ? ?  ? ? ?Patient will benefit from skilled therapeutic intervention in order to improve the following deficits and impairments:  Decreased range of motion, Increased fascial restricitons, Increased muscle spasms, Impaired UE functional use, Decreased activity tolerance, Pain, Hypomobility, Impaired flexibility, Improper body mechanics, Decreased mobility, Decreased strength, Postural dysfunction ? ?Visit Diagnosis: ?Abnormal posture ? ?Muscle weakness (generalized) ? ?Cramp and spasm ? ? ? ? ?Problem List ?Patient Active Problem List  ? Diagnosis Date Noted  ? maternal ovarian cancer  11/20/2019  ? Allergic contact dermatitis 08/23/2019  ? Chronic rhinitis 08/13/2019  ? Endometriosis of pelvis 06/25/2017  ? Subclinical hypothyroidism 06/25/2017  ? Obesity (BMI 30-39.9) 06/25/2017  ? 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 ?Brookes Craine U PT, DPT, PN2  ? ?Supplemental Physical Therapist ?[facility]  ? ? ? ? ? ?[place] ?[place] ?[address] ?[place], [place], [address] ?Phone: [phone]   Fax:  [phone] ? ?Name: Jya Hughston ?MRN: [id number] ?Date of Birth: 1980/07/27 ? ? ? ?

## 2021-03-30 ENCOUNTER — Other Ambulatory Visit (INDEPENDENT_AMBULATORY_CARE_PROVIDER_SITE_OTHER): Payer: Self-pay | Admitting: Family Medicine

## 2021-03-30 ENCOUNTER — Encounter (INDEPENDENT_AMBULATORY_CARE_PROVIDER_SITE_OTHER): Payer: Self-pay | Admitting: Family Medicine

## 2021-03-30 ENCOUNTER — Ambulatory Visit (INDEPENDENT_AMBULATORY_CARE_PROVIDER_SITE_OTHER): Payer: BC Managed Care – PPO | Admitting: Family Medicine

## 2021-03-30 ENCOUNTER — Other Ambulatory Visit: Payer: Self-pay

## 2021-03-30 VITALS — BP 121/81 | HR 90 | Temp 97.7°F | Ht 67.0 in | Wt 198.0 lb

## 2021-03-30 DIAGNOSIS — Z9189 Other specified personal risk factors, not elsewhere classified: Secondary | ICD-10-CM | POA: Diagnosis not present

## 2021-03-30 DIAGNOSIS — E559 Vitamin D deficiency, unspecified: Secondary | ICD-10-CM

## 2021-03-30 DIAGNOSIS — E782 Mixed hyperlipidemia: Secondary | ICD-10-CM | POA: Diagnosis not present

## 2021-03-30 DIAGNOSIS — E669 Obesity, unspecified: Secondary | ICD-10-CM

## 2021-03-30 DIAGNOSIS — Z6831 Body mass index (BMI) 31.0-31.9, adult: Secondary | ICD-10-CM | POA: Diagnosis not present

## 2021-03-30 MED ORDER — VITAMIN D (ERGOCALCIFEROL) 1.25 MG (50000 UNIT) PO CAPS: 50000.0000 [IU] | ORAL_CAPSULE | ORAL | 1 refills | Status: DC

## 2021-03-31 ENCOUNTER — Encounter: Payer: Self-pay | Admitting: Physical Therapy

## 2021-03-31 ENCOUNTER — Ambulatory Visit: Payer: BC Managed Care – PPO | Admitting: Physical Therapy

## 2021-03-31 DIAGNOSIS — R293 Abnormal posture: Secondary | ICD-10-CM | POA: Diagnosis not present

## 2021-03-31 DIAGNOSIS — M6281 Muscle weakness (generalized): Secondary | ICD-10-CM | POA: Diagnosis not present

## 2021-03-31 DIAGNOSIS — R252 Cramp and spasm: Secondary | ICD-10-CM

## 2021-03-31 NOTE — Therapy (Signed)
[facility] ?[place] ?[address] ?[place], [place], [address] ?Phone: [phone]   Fax:  [phone] ? ?Physical Therapy Treatment ? ?Patient Details  ?Name: Erika Garrett ?MRN: [id number] ?Date of Birth: [birth year] ?Referring Provider (PT): Wendling ? ? ?Encounter Date: 03/31/2021 ? ? PT End of Session - 03/31/21 1026   ? ? Visit Number 11   ? Number of Visits 13   ? Date for PT Re-Evaluation 04/06/21   ? Authorization Type BCBS   ? Authorization Time Period 02/23/21 to 04/06/21   ? PT Start Time 4650   ? PT Stop Time 1013   ? PT Time Calculation (min) 41 min   ? Activity Tolerance Patient tolerated treatment well   ? Behavior During Therapy [facility] for tasks assessed/performed   ? ?  ?  ? ?  ? ? ?Past Medical History:  ?Diagnosis Date  ? Allergy   ? Anxiety   ? Back pain   ? Bilateral swelling of feet   ? Constipation   ? Depression   ? Elevated liver enzymes   ? Endometriosis   ? Hyperlipidemia   ? hx of, no medications  ? Hypertension 2016  ? Intercranial Hypertension  ? Intracranial hypertension   ? Joint pain   ? Other fatigue   ? 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 Shortness of breath on exertion   ? Subclinical hypothyroidism   ? Ventral hernia   ? Vitamin D deficiency   ? ? ?Past Surgical History:  ?Procedure Laterality Date  ? ABLATION ON ENDOMETRIOSIS    ? ROBOTIC ASSISTED LAPAROSCOPIC PARTIAL CYSTECTOMY    ? ? ?There were no vitals filed for this visit. ? ? Subjective Assessment - 03/31/21 0933   ? ? Subjective I felt good after last session, not too sore but I'm really feeling it in my upper trap and front of my left pec. Tricep and left arm are much better.   ? Patient Stated Goals get pain gone, be able to travel without difficulty   ? Currently in Pain? No/denies   just tight  ? ?  ?  ? ?  ? ? ? ? ? ? ? ? ? ? ? ? ? ? ? ? ? ? ? ? [facility] Adult PT Treatment/Exercise - 03/31/21 0001   ? ?  ? Neck Exercises: Machines for Strengthening  ? Other Machines for  Strengthening posterior delt rows 8# 1x10 B; OH press 5# 1x10   ? Other Machines for Strengthening shoulder extensions 2x7 15#, cable rows 1x7 15#; D1 10# 1x7B   ?  ? Neck Exercises: Seated  ? Other Seated Exercise UBE L4 3 min forward/3 min backward   ?  ? Manual Therapy  ? Manual Therapy Soft tissue mobilization   ? Soft tissue mobilization L upper trap and middle scalenes; distal L pec attachment point   ? ?  ?  ? ?  ? ? ? ? ? ? ? ? ? ? PT Education - 03/31/21 1026   ? ? Education Details potential POC moving forward   ? Person(s) Educated Patient   ? Methods Explanation   ? Comprehension Verbalized understanding   ? ?  ?  ? ?  ? ? ? PT Short Term Goals - 03/03/21 1101   ? ?  ? PT SHORT TERM GOAL #1  ? Title Will be compliant with appropriate progressive HEP, to be updated PRN   ? Baseline Initiated   ?  Time 2   ? Period Weeks   ? Status On-going   ? Target Date 03/16/21   ?  ? PT SHORT TERM GOAL #2  ? Title Will demonstrate more upright posture  as well as improved general postural awareness at least 50% of the time   ? Time 3   ? Period Weeks   ? Status New   ?  ? PT SHORT TERM GOAL #3  ? Title Frequency and intensity of severe bouts of pain in cervical spine and LUE will improve by at least 50% to show improvement of condition   ? Time 3   ? Period Weeks   ? Status New   ?  ? PT SHORT TERM GOAL #4  ? Title Will be able to name at least 3 ways to improve ergonomic set up of work station and 3 techniques to relieve stress   ? Time 3   ? Period Weeks   ? Status New   ? ?  ?  ? ?  ? ? ? ? PT Long Term Goals - 02/23/21 1650   ? ?  ? PT LONG TERM GOAL #1  ? Title MMT to improve to 5/5 in all weak groups to reduce pain   ? Time 6   ? Period Weeks   ? Status New   ? Target Date 04/06/21   ?  ? PT LONG TERM GOAL #2  ? Title Will be able to perform all work related tasks for unlimited duration with cervical and LUE pain no more than 1/10 and no feelings of arm "falling asleep"   ? Time 6   ? Period Weeks   ? Status New    ?  ? PT LONG TERM GOAL #3  ? Title Will be compliant with appropriate gym based exercise program to maintain functional gains and prevent recurrence of pain   ? Time 6   ? Period Weeks   ? Status New   ? ?  ?  ? ?  ? ? ? ? ? ? ? ? Plan - 03/31/21 1026   ? ? Clinical Impression Statement Erika Garrett arrives feeling well this morning, we have been able to isolate her pain even more to upper trap/scalenes and L pec. Continued with soft tissue work, then progressed functional strengthening. We will need a progress note next week, she has made great progress but I do anticipate she will likely need to continue with [name] a bit longer.   ? Personal Factors and Comorbidities Age;Past/Current Experience;Profession;Social Background;Time since onset of injury/illness/exacerbation   ? Examination-Activity Limitations Reach Overhead;Carry;Caring for Others;Dressing;Lift   ? Examination-Participation Restrictions Occupation;Cleaning;Community Activity;Driving;Interpersonal Relationship;Laundry;Shop;Yard Work   ? Stability/Clinical Decision Making Evolving/Moderate complexity   ? Clinical Decision Making Moderate   ? Rehab Potential Good   ? PT Frequency 2x / week   ? PT Duration 6 weeks   ? PT Treatment/Interventions ADLs/Self Care Home Management;Cryotherapy;Electrical Stimulation;Iontophoresis '4mg'$ /ml Dexamethasone;Moist Heat;Ultrasound;Therapeutic activities;Therapeutic exercise;Patient/family education;Manual techniques;Passive range of motion;Dry needling;Taping   ? PT Next Visit Plan continue STM/MFR, thoracic mobs, rib mobs, intercostal release and L rotator cuff MFR PRN. Progress postural strength as tolerated. Ergonomics   ? PT Home Exercise Plan [id number] plus red TB scap retractions, pec stretch over foam roller, adjusted pec stretch in door way, thoracic extensions over towel with UE flexion, shoulder extension/rows/ER with bands, tricep foam rolling   ? Consulted and Agree with Plan of Care Patient   ? ?  ?  ? ?  ? ? ?  Patient  will benefit from skilled therapeutic intervention in order to improve the following deficits and impairments:  Decreased range of motion, Increased fascial restricitons, Increased muscle spasms, Impaired UE functional use, Decreased activity tolerance, Pain, Hypomobility, Impaired flexibility, Improper body mechanics, Decreased mobility, Decreased strength, Postural dysfunction ? ?Visit Diagnosis: ?Abnormal posture ? ?Muscle weakness (generalized) ? ?Cramp and spasm ? ? ? ? ?Problem List ?Patient Active Problem List  ? Diagnosis Date Noted  ? maternal ovarian cancer  11/20/2019  ? Allergic contact dermatitis 08/23/2019  ? Chronic rhinitis 08/13/2019  ? Endometriosis of pelvis 06/25/2017  ? Subclinical hypothyroidism 06/25/2017  ? Obesity (BMI 30-39.9) 06/25/2017  ? 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 ? ?Sumeet Geter U PT, DPT, PN2  ? ?Supplemental Physical Therapist ?[place] [place]  ? ? ? ? ? ? [place] ?[place] ?[address] ?[place], [place], [address] ?Phone: [phone]   Fax:  [phone] ? ?Name: Erika Garrett ?MRN: [id number] ?Date of Birth: 09/22/1980 ? ? ? ?

## 2021-04-03 NOTE — Progress Notes (Signed)
? ? ? ?Chief Complaint:  ? ?OBESITY ?Erika Garrett is here to discuss her progress with her obesity treatment plan along with follow-up of her obesity related diagnoses. [name] is on keeping a food journal and adhering to recommended goals of 1300-1500 calories and 85+ grams of protein daily and states she is following her eating plan approximately 90% of the time. [name] states she is walking for 30-40 minutes 7 times per week. ? ?Today's visit was #: 12 ?Starting weight: 200 lbs ?Starting date: 03/17/2020 ?Today's weight: 198 lbs ?Today's date: 03/30/2021 ?Total lbs lost to date: 2 ?Total lbs lost since last in-office visit: 0 ? ?Interim History: [name] has done well with maintaining her weight, but she has increased her strengthening exercise and the bioimpedance scale shows her muscle mas has started to increase and fat % has decreased. ? ?Subjective:  ? ?1. Vitamin D deficiency ?Erika Garrett's Vit D level is slowly improving, but is not yet at goal. ? ?2. Elevated triglycerides with high cholesterol ?Erika Garrett's triglycerides were elevated recently. This may be due to some increased simple carbohydrates. She denies abdominal pain. ? ?3. At risk for heart disease ?Erika Garrett is at higher than average risk for cardiovascular disease due to obesity. ? ?Assessment/Plan:  ? ?1. Vitamin D deficiency ?We will refill prescription Vitamin D for 2 months. We will recheck labs in 3 months. Erika Garrett will follow-up for routine testing of Vitamin D, at least 2-3 times per year to avoid over-replacement. ? ?- Vitamin D, Ergocalciferol, (DRISDOL) 1.25 MG (50000 UNIT) CAPS capsule; Take 1 capsule (50,000 Units total) by mouth every 7 (seven) days.  Dispense: 4 capsule; Refill: 1 ? ?2. Elevated triglycerides with high cholesterol ?Erika Garrett will continue with diet and exercise, and we will recheck labs in 3 months. ? ?3. At risk for heart disease ?Girl was given approximately 15 minutes of coronary artery disease prevention counseling today. She is 41 y.o. female and has  risk factors for heart disease including obesity. We discussed intensive lifestyle modifications today with an emphasis on specific weight loss instructions and strategies. ? ?Repetitive spaced learning was employed today to elicit superior memory formation and behavioral change.  ? ?4. Obesity BMI today is 31.0 ?Erika Garrett is currently in the action stage of change. As such, her goal is to continue with weight loss efforts. She has agreed to the Category 2 Plan or keeping a food journal and adhering to recommended goals of 1200-1500 calories and 85+ grams of protein daily.  ? ?Exercise goals: As is. ? ?Behavioral modification strategies: increasing lean protein intake and increasing water intake. ? ?Erika Garrett has agreed to follow-up with our clinic in 5 weeks. She was informed of the importance of frequent follow-up visits to maximize her success with intensive lifestyle modifications for her multiple health conditions.  ? ?Objective:  ? ?Blood pressure 121/81, pulse 90, temperature 97.7 ?F (36.5 ?C), height '5\' 7"'$  (1.702 m), weight 198 lb (89.8 kg), last menstrual period 11/27/2019, SpO2 99 %. ?Body mass index is 31.01 kg/m?. ? ?General: Cooperative, alert, well developed, in no acute distress. ?HEENT: Conjunctivae and lids unremarkable. ?Cardiovascular: Regular rhythm.  ?Lungs: Normal work of breathing. ?Neurologic: No focal deficits.  ? ?Lab Results  ?Component Value Date  ? CREATININE 0.75 02/27/2021  ? BUN 23 02/27/2021  ? NA 138 02/27/2021  ? K 4.5 02/27/2021  ? CL 102 02/27/2021  ? CO2 20 02/27/2021  ? ?Lab Results  ?Component Value Date  ? ALT 39 (H) 02/27/2021  ? AST 22 02/27/2021  ?  ALKPHOS 94 02/27/2021  ? BILITOT 0.4 02/27/2021  ? ?Lab Results  ?Component Value Date  ? HGBA1C 5.2 10/10/2020  ? HGBA1C 5.2 03/17/2020  ? ?Lab Results  ?Component Value Date  ? INSULIN 10.6 10/10/2020  ? INSULIN 6.4 03/17/2020  ? ?Lab Results  ?Component Value Date  ? TSH 2.38 02/01/2020  ? ?Lab Results  ?Component Value Date  ? CHOL  223 (H) 02/27/2021  ? HDL 45 02/27/2021  ? LDLCALC 142 (H) 02/27/2021  ? LDLDIRECT 135.0 09/17/2018  ? TRIG 198 (H) 02/27/2021  ? CHOLHDL 5 11/04/2020  ? ?Lab Results  ?Component Value Date  ? VD25OH 43.6 02/27/2021  ? VD25OH 29.76 (L) 09/23/2020  ? VD25OH 33.06 02/01/2020  ? ?Lab Results  ?Component Value Date  ? WBC 9.3 09/23/2020  ? HGB 14.5 09/23/2020  ? HCT 42.8 09/23/2020  ? MCV 91.6 09/23/2020  ? PLT 230.0 09/23/2020  ? ?No results found for: IRON, TIBC, FERRITIN ? ?Attestation Statements:  ? ?Reviewed by clinician on day of visit: allergies, medications, problem list, medical history, surgical history, family history, social history, and previous encounter notes. ? ? ?I, Trixie Dredge, am acting as transcriptionist for Dennard Nip, MD. ? ?I have reviewed the above documentation for accuracy and completeness, and I agree with the above. -  Dennard Nip, MD ? ? ?

## 2021-04-04 ENCOUNTER — Ambulatory Visit: Payer: BC Managed Care – PPO | Admitting: Physical Therapy

## 2021-04-04 ENCOUNTER — Other Ambulatory Visit: Payer: Self-pay

## 2021-04-04 ENCOUNTER — Encounter: Payer: Self-pay | Admitting: Physical Therapy

## 2021-04-04 DIAGNOSIS — R293 Abnormal posture: Secondary | ICD-10-CM

## 2021-04-04 DIAGNOSIS — R252 Cramp and spasm: Secondary | ICD-10-CM | POA: Diagnosis not present

## 2021-04-04 DIAGNOSIS — M6281 Muscle weakness (generalized): Secondary | ICD-10-CM | POA: Diagnosis not present

## 2021-04-04 NOTE — Therapy (Signed)
[facility] ?[address] ?[address] ?[place], [place], [address] ?Phone: [phone]   Fax:  [phone] ? ?Physical Therapy Treatment ? ?Patient Details  ?Name: Erika Garrett ?MRN: [id number] ?Date of Birth: [birth year] ?Referring Provider (PT): Wendling ? ? ?Encounter Date: 04/04/2021 ? ? PT End of Session - 04/04/21 0929   ? ? Visit Number 12   ? Number of Visits 13   ? Date for PT Re-Evaluation 04/06/21   ? Authorization Type BCBS   ? Authorization Time Period 02/23/21 to 04/06/21   ? PT Start Time [phone]   ? PT Stop Time [phone]   ? PT Time Calculation (min) 38 min   ? Activity Tolerance Patient tolerated treatment well   ? Behavior During Therapy [facility] for tasks assessed/performed   ? ?  ?  ? ?  ? ? ?Past Medical History:  ?Diagnosis Date  ? Allergy   ? Anxiety   ? Back pain   ? Bilateral swelling of feet   ? Constipation   ? Depression   ? Elevated liver enzymes   ? Endometriosis   ? Hyperlipidemia   ? hx of, no medications  ? Hypertension 2016  ? Intercranial Hypertension  ? Intracranial hypertension   ? Joint pain   ? Other fatigue   ? 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 Shortness of breath on exertion   ? Subclinical hypothyroidism   ? Ventral hernia   ? Vitamin D deficiency   ? ? ?Past Surgical History:  ?Procedure Laterality Date  ? ABLATION ON ENDOMETRIOSIS    ? ROBOTIC ASSISTED LAPAROSCOPIC PARTIAL CYSTECTOMY    ? ? ?There were no vitals filed for this visit. ? ? Subjective Assessment - 04/04/21 0852   ? ? Subjective I felt really good after last session, I was sore like we expected but now the right side of my neck is more sore than the left. I'm just generally sore and achey. I wonder if my scalenes are making me more achey down my back. I've been doing research on pillows, there's a gel pillow that looks ok but I have to go to [place] to get it, I don't want to order it online.   ? Patient Stated Goals get pain gone, be able to travel without difficulty   ?  Currently in Pain? No/denies   just tight in R scalene area  ? ?  ?  ? ?  ? ? ? ? ? ? ? ? ? ? ? ? ? ? ? ? ? ? ? ? [facility] Adult PT Treatment/Exercise - 04/04/21 0001   ? ?  ? Neck Exercises: Machines for Strengthening  ? Other Machines for Strengthening posterior delt rows 8# 1x12 B; KB OH press 5# 1x10 each side   ?  ? Neck Exercises: Seated  ? Other Seated Exercise UBE L4 3 min forward/3 min backward   ?  ? Neck Exercises: Prone  ? Other Prone Exercise cat cow x20, thoracic rotaitons 1x10  B   quadruped  ?  ? Manual Therapy  ? Manual Therapy Soft tissue mobilization   ? Soft tissue mobilization B scalenes and SCMs, some work to [place]   ? ?  ?  ? ?  ? ? ? ? ? ? ? ? ? ? PT Education - 04/04/21 0928   ? ? Education Details plan for next session, HEP additions   ? Person(s) Educated Patient   ? Methods Explanation   ?  Comprehension Verbalized understanding   ? ?  ?  ? ?  ? ? ? PT Short Term Goals - 03/03/21 1101   ? ?  ? PT SHORT TERM GOAL #1  ? Title Will be compliant with appropriate progressive HEP, to be updated PRN   ? Baseline Initiated   ? Time 2   ? Period Weeks   ? Status On-going   ? Target Date 03/16/21   ?  ? PT SHORT TERM GOAL #2  ? Title Will demonstrate more upright posture  as well as improved general postural awareness at least 50% of the time   ? Time 3   ? Period Weeks   ? Status New   ?  ? PT SHORT TERM GOAL #3  ? Title Frequency and intensity of severe bouts of pain in cervical spine and LUE will improve by at least 50% to show improvement of condition   ? Time 3   ? Period Weeks   ? Status New   ?  ? PT SHORT TERM GOAL #4  ? Title Will be able to name at least 3 ways to improve ergonomic set up of work station and 3 techniques to relieve stress   ? Time 3   ? Period Weeks   ? Status New   ? ?  ?  ? ?  ? ? ? ? PT Long Term Goals - 02/23/21 1650   ? ?  ? PT LONG TERM GOAL #1  ? Title MMT to improve to 5/5 in all weak groups to reduce pain   ? Time 6   ? Period Weeks   ? Status New   ? Target Date  04/06/21   ?  ? PT LONG TERM GOAL #2  ? Title Will be able to perform all work related tasks for unlimited duration with cervical and LUE pain no more than 1/10 and no feelings of arm "falling asleep"   ? Time 6   ? Period Weeks   ? Status New   ?  ? PT LONG TERM GOAL #3  ? Title Will be compliant with appropriate gym based exercise program to maintain functional gains and prevent recurrence of pain   ? Time 6   ? Period Weeks   ? Status New   ? ?  ?  ? ?  ? ? ? ? ? ? ? ? Plan - 04/04/21 0929   ? ? Clinical Impression Statement Erika Garrett arrives today, feeling better but having more tightness and stiffness in B scalenes and SCMs, we spent some time working on STM to these areas, also worked on more thoracic mobility today as well. Otherwise continued strengthening. She is planning on getting a custom pillow from a specialty store in [place], I think this is a great idea.  Added SCM and some home cable weight work to [employer] today too. Will need re-assessment next session.   ? Personal Factors and Comorbidities Age;Past/Current Experience;Profession;Social Background;Time since onset of injury/illness/exacerbation   ? Examination-Activity Limitations Reach Overhead;Carry;Caring for Others;Dressing;Lift   ? Examination-Participation Restrictions Occupation;Cleaning;Community Activity;Driving;Interpersonal Relationship;Laundry;Shop;Yard Work   ? Stability/Clinical Decision Making Evolving/Moderate complexity   ? Clinical Decision Making Moderate   ? Rehab Potential Good   ? PT Frequency 2x / week   ? PT Duration 6 weeks   ? PT Treatment/Interventions ADLs/Self Care Home Management;Cryotherapy;Electrical Stimulation;Iontophoresis '4mg'$ /ml Dexamethasone;Moist Heat;Ultrasound;Therapeutic activities;Therapeutic exercise;Patient/family education;Manual techniques;Passive range of motion;Dry needling;Taping   ? PT Next Visit Plan re-assess   ?  PT Home Exercise Plan [id number] plus red TB scap retractions, pec stretch over foam roller,  adjusted pec stretch in door way, thoracic extensions over towel with UE flexion, shoulder extension/rows/ER with bands, tricep foam rolling, SCM stretches, home shoulder extensions and OH press with weights   ? Consulted and Agree with Plan of Care Patient   ? ?  ?  ? ?  ? ? ?Patient will benefit from skilled therapeutic intervention in order to improve the following deficits and impairments:  Decreased range of motion, Increased fascial restricitons, Increased muscle spasms, Impaired UE functional use, Decreased activity tolerance, Pain, Hypomobility, Impaired flexibility, Improper body mechanics, Decreased mobility, Decreased strength, Postural dysfunction ? ?Visit Diagnosis: ?Abnormal posture ? ?Cramp and spasm ? ?Muscle weakness (generalized) ? ? ? ? ?Problem List ?Patient Active Problem List  ? Diagnosis Date Noted  ? maternal ovarian cancer  11/20/2019  ? Allergic contact dermatitis 08/23/2019  ? Chronic rhinitis 08/13/2019  ? Endometriosis of pelvis 06/25/2017  ? Subclinical hypothyroidism 06/25/2017  ? Obesity (BMI 30-39.9) 06/25/2017  ? 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 ?Erika Garrett U PT, DPT, PN2  ? ?Supplemental Physical Therapist ?[facility]  ? ? ? ? ?[facility] ?[address] ?[place], [place], [address] ?Phone: [phone]   Fax:  [phone] ? ?Name: Erika Garrett ?MRN: [id number] ?Date of Birth: 12-17-1980 ? ? ? ?

## 2021-04-06 ENCOUNTER — Encounter: Payer: Self-pay | Admitting: Physical Therapy

## 2021-04-06 ENCOUNTER — Other Ambulatory Visit: Payer: Self-pay

## 2021-04-06 ENCOUNTER — Ambulatory Visit: Payer: BC Managed Care – PPO | Admitting: Physical Therapy

## 2021-04-06 DIAGNOSIS — M6281 Muscle weakness (generalized): Secondary | ICD-10-CM | POA: Diagnosis not present

## 2021-04-06 DIAGNOSIS — R293 Abnormal posture: Secondary | ICD-10-CM | POA: Diagnosis not present

## 2021-04-06 DIAGNOSIS — R252 Cramp and spasm: Secondary | ICD-10-CM | POA: Diagnosis not present

## 2021-04-06 NOTE — Therapy (Signed)
[facility] ?[place] ?[address] ?[place], [place], [address] ?Phone: [phone]   Fax:  [phone] ? ?Physical Therapy Treatment ? ?Patient Details  ?Name: Erika Garrett ?MRN: [id number] ?Date of Birth: [birth year] ?Referring Provider (PT): Wendling ? ? ?Encounter Date: 04/06/2021 ? ? PT End of Session - 04/06/21 1024   ? ? Visit Number 13   ? Number of Visits 17   ? Date for PT Re-Evaluation 05/04/21   ? Authorization Type BCBS   ? Authorization Time Period 02/23/21 to 04/06/21; extended to 05/04/21   ? PT Start Time [phone]   ? PT Stop Time 1015   ? PT Time Calculation (min) 42 min   ? Activity Tolerance Patient tolerated treatment well   ? Behavior During Therapy [facility] for tasks assessed/performed   ? ?  ?  ? ?  ? ? ?Past Medical History:  ?Diagnosis Date  ? Allergy   ? Anxiety   ? Back pain   ? Bilateral swelling of feet   ? Constipation   ? Depression   ? Elevated liver enzymes   ? Endometriosis   ? Hyperlipidemia   ? hx of, no medications  ? Hypertension 2016  ? Intercranial Hypertension  ? Intracranial hypertension   ? Joint pain   ? Other fatigue   ? 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 Shortness of breath on exertion   ? Subclinical hypothyroidism   ? Ventral hernia   ? Vitamin D deficiency   ? ? ?Past Surgical History:  ?Procedure Laterality Date  ? ABLATION ON ENDOMETRIOSIS    ? ROBOTIC ASSISTED LAPAROSCOPIC PARTIAL CYSTECTOMY    ? ? ?There were no vitals filed for this visit. ? ? Subjective Assessment - 04/06/21 0934   ? ? Subjective I'm still achey in my neck, something happens in the neck/shoulder and it starts to twinge. Its like the muscle is tightening, I'm wondering if there's a nerve involved. There's something in my neck when I do some movement, especially on the back of the shoulder. Its not numb or tingling when it happens. I don't get tightness in my upper arm. No sharp pains or throbbing pains, its like an aching twinge. Big picture I feel like  its getting better but I want it to go away. R side of my neck is completely better today.   ? Patient Stated Goals get pain gone, be able to travel without difficulty   ? Currently in Pain? No/denies   more like an annoying ache, not painful  ? ?  ?  ? ?  ? ? ? ? ? [facility] PT Assessment - 04/06/21 0001   ? ?  ? Assessment  ? Medical Diagnosis neck pain   ? Referring Provider (PT) Nani Ravens   ? Onset Date/Surgical Date --   July 2022  ? Next MD Visit Wendling PRN   ? Prior Therapy PT in the past before abdominal surgery/for adhesions   ?  ? Precautions  ? Precautions None   ?  ? Restrictions  ? Weight Bearing Restrictions No   ?  ? Balance Screen  ? Has the patient fallen in the past 6 months No   ? Has the patient had a decrease in activity level because of a fear of falling?  No   ? Is the patient reluctant to leave their home because of a fear of falling?  No   ?  ? Home Environment  ? Living Environment  Private residence   ?  ? Prior Function  ? Level of Independence Independent;Independent with basic ADLs   ? Vocation Full time employment   ? Vocation Arboriculturist, travels quite a bit for work   ? Leisure "life is mostly work", cooking, travelling for fun   ?  ? Posture/Postural Control  ? Posture/Postural Control Postural limitations   ? Postural Limitations Rounded Shoulders;Forward head;Increased thoracic kyphosis   ?  ? AROM  ? Cervical Flexion WNL   ? Cervical Extension WNL   ? Cervical - Right Side Bend WNL   ? Cervical - Left Side Bend moderate limitation   ? Cervical - Right Rotation WNL   ? Cervical - Left Rotation WNL   ?  ? Strength  ? Right Shoulder Flexion 5/5   ? Right Shoulder Extension 4+/5   ? Right Shoulder ABduction 5/5   ? Right Shoulder Internal Rotation 4/5   ? Right Shoulder External Rotation 4/5   ? Left Shoulder Flexion 5/5   ? Left Shoulder Extension 4-/5   ? Left Shoulder ABduction 5/5   ? Left Shoulder Internal Rotation 4/5   ? Left Shoulder External Rotation 4/5   ?  Right Elbow Flexion 4/5   ? Right Elbow Extension 4+/5   ? Left Elbow Flexion 4-/5   ? Left Elbow Extension 4/5   ?  ? Palpation  ? Palpation comment still quite tight in scalenes, L levator quite tight, upper traps still tight but improved, throacic prarspinals improved but still tight   ? ?  ?  ? ?  ? ? ? ? ? ? ? ? ? ? ? ? ? ? ? ? [facility] Adult PT Treatment/Exercise - 04/06/21 0001   ? ?  ? Manual Therapy  ? Manual Therapy Soft tissue mobilization   ? Soft tissue mobilization L distal levator attachment point on scapula   ? ?  ?  ? ?  ? ? ? ? ? ? ? ? ? ? PT Education - 04/06/21 1023   ? ? Education Details reassessment findings, goal review; extensive education about anatomy of this area and stretches for levator/strengthening for supportive muscle groups; extensive education on how iontophoresis works and how it may help to reduce irritation/inflammation at levator attachment point, localized steroid not systemic   ? Person(s) Educated Patient   ? Methods Explanation   ? Comprehension Verbalized understanding   ? ?  ?  ? ?  ? ? ? PT Short Term Goals - 04/06/21 0945   ? ?  ? PT SHORT TERM GOAL #1  ? Title Will be compliant with appropriate progressive HEP, to be updated PRN   ? Time 2   ? Period Weeks   ? Status Achieved   ? Target Date 03/16/21   ?  ? PT SHORT TERM GOAL #2  ? Title Will demonstrate more upright posture  as well as improved general postural awareness at least 50% of the time   ? Baseline 3/16- awareness is improving but still has postural limits   ? Time 3   ? Period Weeks   ? Status Partially Met   ?  ? PT SHORT TERM GOAL #3  ? Title Frequency and intensity of severe bouts of pain in cervical spine and LUE will improve by at least 50% to show improvement of condition   ? Baseline 3/16- much improved, no more severe attacks of symptoms, no more episodes of numbness/dead arm like before PT   ?  Time 3   ? Period Weeks   ? Status Achieved   ?  ? PT SHORT TERM GOAL #4  ? Title Will be able to name at  least 3 ways to improve ergonomic set up of work station and 3 techniques to relieve stress   ? Baseline 3/16- has new less stressful job, has started walking more to help wtih stress relief   ? Time 3   ? Period Weeks   ? Status Partially Met   ? ?  ?  ? ?  ? ? ? ? PT Long Term Goals - 04/06/21 0947   ? ?  ? PT LONG TERM GOAL #1  ? Title MMT to improve to 5/5 in all weak groups to reduce pain   ? Baseline 3/16- improving but L arm weak   ? Time 6   ? Period Weeks   ? Status On-going   ?  ? PT LONG TERM GOAL #2  ? Title Will be able to perform all work related tasks for unlimited duration with cervical and LUE pain no more than 1/10 and no feelings of arm "falling asleep"   ? Baseline 3/16- no more arm falling asleep, work does not increase symptoms   ? Time 6   ? Period Weeks   ? Status Achieved   ?  ? PT LONG TERM GOAL #3  ? Title Will be compliant with appropriate gym based exercise program to maintain functional gains and prevent recurrence of pain   ? Baseline progressing   ? Time 6   ? Period Weeks   ? Status On-going   ? ?  ?  ? ?  ? ? ? ? ? ? ? ? Plan - 04/06/21 1025   ? ? Clinical Impression Statement Bryana arrives today doing OK, she was really anxious today about pain in her levator area and relation to the muscle tightness in the rest of her L neck and shoulder/pecs. We did need to get updated objective measures and needed to review goals today as well. We did some focused STM to L levator attachment point on the scapula, very ?crunchy? and irritated but we were able to improve this area. I do think hthis levator is overstretched and irritated and likely very much contributing to symptoms. We did discuss ionto, she is more open to this but wants to do some research on this before we formally start the patch, will potentially do this next session. Will plan to continue at 1x/week moving forward.   ? Personal Factors and Comorbidities Age;Past/Current Experience;Profession;Social Background;Time since onset  of injury/illness/exacerbation   ? Examination-Activity Limitations Reach Overhead;Carry;Caring for Others;Dressing;Lift   ? Examination-Participation Restrictions Occupation;Cleaning;Community Activity;D

## 2021-04-12 ENCOUNTER — Other Ambulatory Visit: Payer: Self-pay

## 2021-04-12 ENCOUNTER — Encounter: Payer: Self-pay | Admitting: Physical Therapy

## 2021-04-12 ENCOUNTER — Ambulatory Visit: Payer: BC Managed Care – PPO | Admitting: Physical Therapy

## 2021-04-12 DIAGNOSIS — R252 Cramp and spasm: Secondary | ICD-10-CM

## 2021-04-12 DIAGNOSIS — R293 Abnormal posture: Secondary | ICD-10-CM

## 2021-04-12 DIAGNOSIS — M6281 Muscle weakness (generalized): Secondary | ICD-10-CM

## 2021-04-12 NOTE — Therapy (Signed)
[facility] ?[place], [place], [address] ?Phone: [phone]   Fax:  [phone] ? ?Physical Therapy Treatment ? ?Patient Details  ?Name: Erika Garrett ?MRN: [id number] ?Date of Birth: Oct 29, 1980 ?Referring Provider (PT): Wendling ? ? ?Encounter Date: 04/12/2021 ? ? PT End of Session - 04/12/21 0930   ? ? Visit Number 14   ? Number of Visits 17   ? Date for PT Re-Evaluation 05/04/21   ? Authorization Type BCBS   ? Authorization Time Period 02/23/21 to 04/06/21; extended to 05/04/21   ? PT Start Time 0848   ? PT Stop Time 2119   ? PT Time Calculation (min) 39 min   ? Activity Tolerance Patient tolerated treatment well   ? Behavior During Therapy [facility] for tasks assessed/performed   ? ?  ?  ? ?  ? ? ?Past Medical History:  ?Diagnosis Date  ? Allergy   ? Anxiety   ? Back pain   ? Bilateral swelling of feet   ? Constipation   ? Depression   ? Elevated liver enzymes   ? Endometriosis   ? Hyperlipidemia   ? hx of, no medications  ? Hypertension 2016  ? Intercranial Hypertension  ? Intracranial hypertension   ? Joint pain   ? Other fatigue   ? 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 Shortness of breath on exertion   ? Subclinical hypothyroidism   ? Ventral hernia   ? Vitamin D deficiency   ? ? ?Past Surgical History:  ?Procedure Laterality Date  ? ABLATION ON ENDOMETRIOSIS    ? ROBOTIC ASSISTED LAPAROSCOPIC PARTIAL CYSTECTOMY    ? ? ?There were no vitals filed for this visit. ? ? Subjective Assessment - 04/12/21 0849   ? ? Subjective What we did on that muscle last time made that feel a lot better, I haven't looked into the ionto stuff yet. Whatever we did last time helped a lot, I've been able to keep it a lot looser this week.   ? Patient Stated Goals get pain gone, be able to travel without difficulty   ? Currently in Pain? No/denies   ? ?  ?  ? ?  ? ? ? ? ? ? ? ? ? ? ? ? ? ? ? ? ? ? ? ? [place] Adult PT Treatment/Exercise - 04/12/21 0001   ? ?  ? Neck  Exercises: Standing  ? Other Standing Exercises B shoulder extensions 2x7 15# B; tricep pull downs 25# 1x10   ? Other Standing Exercises cable rows 15# 2x7; cable flies 1x10 5#; body blade horizontal with elbow bent then elbow straight 1x30 seconds each   ?  ? Neck Exercises: Seated  ? Other Seated Exercise UBE L4 3 min forward/3 min backward   ?  ? Neck Exercises: Prone  ? Other Prone Exercise blackburn 6 1x10   ?  ? Manual Therapy  ? Manual Therapy Soft tissue mobilization   ? Soft tissue mobilization L distal levator attachment point on scapula   ? ?  ?  ? ?  ? ? ? ? ? ? ? ? ? ? PT Education - 04/12/21 0930   ? ? Education Details POC moving forward, HEP updates   ? Person(s) Educated Patient   ? Methods Explanation   ? Comprehension Verbalized understanding   ? ?  ?  ? ?  ? ? ? PT Short Term Goals - 04/06/21 0945   ? ?  ?  PT SHORT TERM GOAL #1  ? Title Will be compliant with appropriate progressive HEP, to be updated PRN   ? Time 2   ? Period Weeks   ? Status Achieved   ? Target Date 03/16/21   ?  ? PT SHORT TERM GOAL #2  ? Title Will demonstrate more upright posture  as well as improved general postural awareness at least 50% of the time   ? Baseline 3/16- awareness is improving but still has postural limits   ? Time 3   ? Period Weeks   ? Status Partially Met   ?  ? PT SHORT TERM GOAL #3  ? Title Frequency and intensity of severe bouts of pain in cervical spine and LUE will improve by at least 50% to show improvement of condition   ? Baseline 3/16- much improved, no more severe attacks of symptoms, no more episodes of numbness/dead arm like before PT   ? Time 3   ? Period Weeks   ? Status Achieved   ?  ? PT SHORT TERM GOAL #4  ? Title Will be able to name at least 3 ways to improve ergonomic set up of work station and 3 techniques to relieve stress   ? Baseline 3/16- has new less stressful job, has started walking more to help wtih stress relief   ? Time 3   ? Period Weeks   ? Status Partially Met   ? ?  ?   ? ?  ? ? ? ? PT Long Term Goals - 04/06/21 0947   ? ?  ? PT LONG TERM GOAL #1  ? Title MMT to improve to 5/5 in all weak groups to reduce pain   ? Baseline 3/16- improving but L arm weak   ? Time 6   ? Period Weeks   ? Status On-going   ?  ? PT LONG TERM GOAL #2  ? Title Will be able to perform all work related tasks for unlimited duration with cervical and LUE pain no more than 1/10 and no feelings of arm "falling asleep"   ? Baseline 3/16- no more arm falling asleep, work does not increase symptoms   ? Time 6   ? Period Weeks   ? Status Achieved   ?  ? PT LONG TERM GOAL #3  ? Title Will be compliant with appropriate gym based exercise program to maintain functional gains and prevent recurrence of pain   ? Baseline progressing   ? Time 6   ? Period Weeks   ? Status On-going   ? ?  ?  ? ?  ? ? ? ? ? ? ? ? Plan - 04/12/21 0930   ? ? Clinical Impression Statement Erika Garrett arrives today doing well, the work we did on her L levator really seems to have loosened and actually the rest of her shoulder girdle was much more relaxed and supple as well. Continued with cross friction massage on distal levator attachment point, otherwise continued progressing functional strengthening today. She has been able to add cable strengthening to her HEP much more easily as well.   ? Personal Factors and Comorbidities Age;Past/Current Experience;Profession;Social Background;Time since onset of injury/illness/exacerbation   ? Examination-Activity Limitations Reach Overhead;Carry;Caring for Others;Dressing;Lift   ? Examination-Participation Restrictions Occupation;Cleaning;Community Activity;Driving;Interpersonal Relationship;Laundry;Shop;Yard Work   ? Stability/Clinical Decision Making Evolving/Moderate complexity   ? Clinical Decision Making Moderate   ? Rehab Potential Good   ? PT Frequency Other (comment)   ? PT  Duration 4 weeks   ? PT Treatment/Interventions ADLs/Self Care Home Management;Cryotherapy;Electrical Stimulation;Iontophoresis  68m/ml Dexamethasone;Moist Heat;Ultrasound;Therapeutic activities;Therapeutic exercise;Patient/family education;Manual techniques;Passive range of motion;Dry needling;Taping   ? PT Next Visit Plan assess and progress   ? PT Home Exercise Plan [id number] plus red TB scap retractions, pec stretch over foam roller, adjusted pec stretch in door way, thoracic extensions over towel with UE flexion, shoulder extension/rows/ER with bands, tricep foam rolling, SCM stretches, home shoulder extensions and OH press with weights, blackburn 6   ? Consulted and Agree with Plan of Care Patient   ? ?  ?  ? ?  ? ? ?Patient will benefit from skilled therapeutic intervention in order to improve the following deficits and impairments:  Decreased range of motion, Increased fascial restricitons, Increased muscle spasms, Impaired UE functional use, Decreased activity tolerance, Pain, Hypomobility, Impaired flexibility, Improper body mechanics, Decreased mobility, Decreased strength, Postural dysfunction ? ?Visit Diagnosis: ?Abnormal posture ? ?Muscle weakness (generalized) ? ?Cramp and spasm ? ? ? ? ?Problem List ?Patient Active Problem List  ? Diagnosis Date Noted  ? maternal ovarian cancer  11/20/2019  ? Allergic contact dermatitis 08/23/2019  ? Chronic rhinitis 08/13/2019  ? Endometriosis of pelvis 06/25/2017  ? Subclinical hypothyroidism 06/25/2017  ? Obesity (BMI 30-39.9) 06/25/2017  ? 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 ?Erika Garrett U PT, DPT, PN2  ? ?Supplemental Physical Therapist ?[facility] ? ? ? ? ? ?[place] [address] ?[place] [address]?Phone: [phone]  Fax:  [phone]? ?Name: Erika Garrett?MRN: [id number]?Date of Birth: [birth year]? ? ? ?

## 2021-04-20 ENCOUNTER — Encounter: Payer: Self-pay | Admitting: Physical Therapy

## 2021-04-20 ENCOUNTER — Ambulatory Visit: Payer: BC Managed Care – PPO | Admitting: Physical Therapy

## 2021-04-20 DIAGNOSIS — M6281 Muscle weakness (generalized): Secondary | ICD-10-CM

## 2021-04-20 DIAGNOSIS — R293 Abnormal posture: Secondary | ICD-10-CM

## 2021-04-20 DIAGNOSIS — R252 Cramp and spasm: Secondary | ICD-10-CM

## 2021-04-20 NOTE — Therapy (Signed)
[facility] ?[place] [address] ?[place], [place], [address] ?Phone: [phone]   Fax:  [phone] ? ?Physical Therapy Treatment ? ?Patient Details  ?Name: Erika Garrett ?MRN: [id number] ?Date of Birth: 01-Apr-1980 ?Referring Provider (PT): Wendling ? ? ?Encounter Date: 04/20/2021 ? ? PT End of Session - 04/20/21 1153   ? ? Visit Number 15   ? Number of Visits 17   ? Date for PT Re-Evaluation 05/04/21   ? Authorization Type BCBS   ? Authorization Time Period 02/23/21 to 04/06/21; extended to 05/04/21   ? PT Start Time 1102   ? PT Stop Time 1145   ? PT Time Calculation (min) 43 min   ? Activity Tolerance Patient tolerated treatment well   ? Behavior During Therapy [facility] for tasks assessed/performed   ? ?  ?  ? ?  ? ? ?Past Medical History:  ?Diagnosis Date  ? Allergy   ? Anxiety   ? Back pain   ? Bilateral swelling of feet   ? Constipation   ? Depression   ? Elevated liver enzymes   ? Endometriosis   ? Hyperlipidemia   ? hx of, no medications  ? Hypertension 2016  ? Intercranial Hypertension  ? Intracranial hypertension   ? Joint pain   ? Other fatigue   ? 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 Shortness of breath on exertion   ? Subclinical hypothyroidism   ? Ventral hernia   ? Vitamin D deficiency   ? ? ?Past Surgical History:  ?Procedure Laterality Date  ? ABLATION ON ENDOMETRIOSIS    ? ROBOTIC ASSISTED LAPAROSCOPIC PARTIAL CYSTECTOMY    ? ? ?There were no vitals filed for this visit. ? ? Subjective Assessment - 04/20/21 1101   ? ? Subjective My pain is still contained mostly to the levator area and still that upper trap. I felt the symptoms going down the arm again but I was able to recognize it early enough to stretch and move that made it go away. I've been using clove lotion as well right before I go to sleep which helps me go to bed not as tensed up.   ? Currently in Pain? No/denies   ? ?  ?  ? ?  ? ? ? ? ? ? ? ? ? ? ? ? ? ? ? ? ? ? ? ? [place] [facility] Adult PT  Treatment/Exercise - 04/20/21 0001   ? ?  ? Neck Exercises: Seated  ? Other Seated Exercise UBE L4 6 min backwards   ?  ? Neck Exercises: Supine  ? Other Supine Exercise lat foam rolling technique   ?  ? Manual Therapy  ? Manual Therapy Soft tissue mobilization   ? Soft tissue mobilization L distal levator attachment point on scapula and upper trap L   ? Myofascial Release subscapularis MFR   ? ?  ?  ? ?  ? ? ? ? ? ? ? ? ? ? PT Education - 04/20/21 1153   ? ? Education Details extensive education today- see body of note for details   ? Person(s) Educated Patient   ? Methods Explanation   ? Comprehension Verbalized understanding   ? ?  ?  ? ?  ? ? ? PT Short Term Goals - 04/06/21 0945   ? ?  ? PT SHORT TERM GOAL #1  ? Title Will be compliant with appropriate progressive HEP, to be updated PRN   ? Time 2   ?  Period Weeks   ? Status Achieved   ? Target Date 03/16/21   ?  ? PT SHORT TERM GOAL #2  ? Title Will demonstrate more upright posture  as well as improved general postural awareness at least 50% of the time   ? Baseline 3/16- awareness is improving but still has postural limits   ? Time 3   ? Period Weeks   ? Status Partially Met   ?  ? PT SHORT TERM GOAL #3  ? Title Frequency and intensity of severe bouts of pain in cervical spine and LUE will improve by at least 50% to show improvement of condition   ? Baseline 3/16- much improved, no more severe attacks of symptoms, no more episodes of numbness/dead arm like before PT   ? Time 3   ? Period Weeks   ? Status Achieved   ?  ? PT SHORT TERM GOAL #4  ? Title Will be able to name at least 3 ways to improve ergonomic set up of work station and 3 techniques to relieve stress   ? Baseline 3/16- has new less stressful job, has started walking more to help wtih stress relief   ? Time 3   ? Period Weeks   ? Status Partially Met   ? ?  ?  ? ?  ? ? ? ? PT Long Term Goals - 04/06/21 0947   ? ?  ? PT LONG TERM GOAL #1  ? Title MMT to improve to 5/5 in all weak groups to reduce  pain   ? Baseline 3/16- improving but L arm weak   ? Time 6   ? Period Weeks   ? Status On-going   ?  ? PT LONG TERM GOAL #2  ? Title Will be able to perform all work related tasks for unlimited duration with cervical and LUE pain no more than 1/10 and no feelings of arm "falling asleep"   ? Baseline 3/16- no more arm falling asleep, work does not increase symptoms   ? Time 6   ? Period Weeks   ? Status Achieved   ?  ? PT LONG TERM GOAL #3  ? Title Will be compliant with appropriate gym based exercise program to maintain functional gains and prevent recurrence of pain   ? Baseline progressing   ? Time 6   ? Period Weeks   ? Status On-going   ? ?  ?  ? ?  ? ? ? ? ? ? ? ? Plan - 04/20/21 1154   ? ? Clinical Impression Statement Erika Garrett arrives today feeling better, continues to have discomfort and some trigger points at levator attachment site on scapula as well as still some spasms in scalenes. Spent a lot of time today focusing on education for managing mm trigger points with theracane as well as foam rolling technique for lats and in general discussing anatomy and interconnectedness of mm groups in area as well as form for lat pulls and rowing machine. Subscap and lats are very tight, I think if we can get these to release this will help too. I still think ionto might help but she would like to wait a little longer in this case. Will continue to progress as able, I think we are definitely getting close to the point we can DC.   ? Personal Factors and Comorbidities Age;Past/Current Experience;Profession;Social Background;Time since onset of injury/illness/exacerbation   ? Examination-Activity Limitations Reach Overhead;Carry;Caring for Others;Dressing;Lift   ? Examination-Participation Restrictions  Occupation;Cleaning;Community Activity;Driving;Interpersonal Relationship;Laundry;Shop;Yard Work   ? Stability/Clinical Decision Making Evolving/Moderate complexity   ? Clinical Decision Making Moderate   ? Rehab Potential  Good   ? PT Frequency Other (comment)   ? PT Duration 4 weeks   ? PT Treatment/Interventions ADLs/Self Care Home Management;Cryotherapy;Electrical Stimulation;Iontophoresis 62m/ml Dexamethasone;Moist Heat;Ultrasound;Therapeutic activities;Therapeutic exercise;Patient/family education;Manual techniques;Passive range of motion;Dry needling;Taping   ? PT Next Visit Plan assess and progress. Does she want to try ionto?   ? PT Home Exercise Plan [id number] plus red TB scap retractions, pec stretch over foam roller, adjusted pec stretch in door way, thoracic extensions over towel with UE flexion, shoulder extension/rows/ER with bands, tricep foam rolling, SCM stretches, home shoulder extensions and OH press with weights, blackburn 6, lats foam rolling, lats stretch   ? Consulted and Agree with Plan of Care Patient   ? ?  ?  ? ?  ? ? ?Patient will benefit from skilled therapeutic intervention in order to improve the following deficits and impairments:  Decreased range of motion, Increased fascial restricitons, Increased muscle spasms, Impaired UE functional use, Decreased activity tolerance, Pain, Hypomobility, Impaired flexibility, Improper body mechanics, Decreased mobility, Decreased strength, Postural dysfunction ? ?Visit Diagnosis: ?Abnormal posture ? ?Muscle weakness (generalized) ? ?Cramp and spasm ? ? ? ? ?Problem List ?Patient Active Problem List  ? Diagnosis Date Noted  ? maternal ovarian cancer  11/20/2019  ? Allergic contact dermatitis 08/23/2019  ? Chronic rhinitis 08/13/2019  ? Endometriosis of pelvis 06/25/2017  ? Subclinical hypothyroidism 06/25/2017  ? Obesity (BMI 30-39.9) 06/25/2017  ? 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 ?Erika Garrett U PT, DPT, PN2  ? ?Supplemental Physical Therapist ?[facility] ? ? ? ? ? ?[place] [address] ?[place] [address]?Phone: [phone]  Fax:  [phone]? ?Name: Erika Garrett?MRN: [id number]?Date  of Birth: 909-Jan-1982? ? ? ?

## 2021-04-27 ENCOUNTER — Encounter: Payer: Self-pay | Admitting: Physical Therapy

## 2021-04-27 ENCOUNTER — Ambulatory Visit: Payer: BC Managed Care – PPO | Attending: Family Medicine | Admitting: Physical Therapy

## 2021-04-27 DIAGNOSIS — M6281 Muscle weakness (generalized): Secondary | ICD-10-CM | POA: Diagnosis not present

## 2021-04-27 DIAGNOSIS — R252 Cramp and spasm: Secondary | ICD-10-CM | POA: Diagnosis not present

## 2021-04-27 DIAGNOSIS — R293 Abnormal posture: Secondary | ICD-10-CM | POA: Diagnosis not present

## 2021-04-27 NOTE — Therapy (Signed)
[facility] [address] ?[place], [place], [address] ?Phone: [phone]   Fax:  [phone] ? ?Physical Therapy Treatment ? ?Patient Details  ?Name: Erika Garrett ?MRN: [id number] ?Date of Birth: 1980-02-06 ?Referring Provider (PT): Wendling ? ? ?Encounter Date: 04/27/2021 ? ? PT End of Session - 04/27/21 1102   ? ? Visit Number 16   ? Number of Visits 17   ? Date for PT Re-Evaluation 05/04/21   ? Authorization Type BCBS   ? Authorization Time Period 02/23/21 to 04/06/21; extended to 05/04/21   ? PT Start Time 1018   ? PT Stop Time 1058   ? PT Time Calculation (min) 40 min   ? Activity Tolerance Patient tolerated treatment well   ? Behavior During Therapy [facility] for tasks assessed/performed   ? ?  ?  ? ?  ? ? ?Past Medical History:  ?Diagnosis Date  ? Allergy   ? Anxiety   ? Back pain   ? Bilateral swelling of feet   ? Constipation   ? Depression   ? Elevated liver enzymes   ? Endometriosis   ? Hyperlipidemia   ? hx of, no medications  ? Hypertension 2016  ? Intercranial Hypertension  ? Intracranial hypertension   ? Joint pain   ? Other fatigue   ? 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 Shortness of breath on exertion   ? Subclinical hypothyroidism   ? Ventral hernia   ? Vitamin D deficiency   ? ? ?Past Surgical History:  ?Procedure Laterality Date  ? ABLATION ON ENDOMETRIOSIS    ? ROBOTIC ASSISTED LAPAROSCOPIC PARTIAL CYSTECTOMY    ? ? ?There were no vitals filed for this visit. ? ? Subjective Assessment - 04/27/21 1019   ? ? Subjective I don't want to do the ionto because my pain is moving around, the stretches you gave me last time is really really helping. The massage therapist really worked on my subscap area and its sore. Now the front of my arm and shoulder is hurting, the front of my shoulder gets hard and knotty feeling. Everything else is feeling better. It depends on the day how much I feel the new lat stretch.   ? Patient Stated Goals get pain gone,  be able to travel without difficulty   ? Currently in Pain? No/denies   ? ?  ?  ? ?  ? ? ? ? ? ? ? ? ? ? ? ? ? ? ? ? ? ? ? ? Erika Garrett - 04/27/21 0001   ? ?  ? Neck Exercises: Machines for Strengthening  ? Other Machines for Strengthening shoulder extensions 15# 1x10; shoulder ER 5# 1x10 B; U rows 1x10 B 15#   ? Other Machines for Strengthening OH bicep curl to [facility] press and forwarch chest press  5# 1x10 B   ?  ? Neck Exercises: Standing  ? Other Standing Exercises 3 way shoulder f/a/e 1x10   ?  ? Neck Exercises: Seated  ? Other Seated Exercise UBE L4 6 min backwards   ?  ? Neck Exercises: Prone  ? Other Prone Exercise blackburn 6 1x10 1#   ?  ? Neck Exercises: Stretches  ? Other Neck Stretches biceps stretch 3x30 seconds  LUE   ? ?  ?  ? ?  ? ? ? ? ? ? ? ? ? ? PT Education - 04/27/21 1102   ? ? Education Details likely DC  next sessoin, exercise form and specific muscles targeted each exercise   ? Person(s) Educated Patient   ? Methods Explanation   ? Comprehension Verbalized understanding   ? ?  ?  ? ?  ? ? ? PT Short Term Goals - 04/06/21 0945   ? ?  ? PT SHORT TERM GOAL #1  ? Title Will be compliant with appropriate progressive HEP, to be updated PRN   ? Time 2   ? Period Weeks   ? Status Achieved   ? Target Date 03/16/21   ?  ? PT SHORT TERM GOAL #2  ? Title Will demonstrate more upright posture  as well as improved general postural awareness at least 50% of the time   ? Baseline 3/16- awareness is improving but still has postural limits   ? Time 3   ? Period Weeks   ? Status Partially Met   ?  ? PT SHORT TERM GOAL #3  ? Title Frequency and intensity of severe bouts of pain in cervical spine and LUE will improve by at least 50% to show improvement of condition   ? Baseline 3/16- much improved, no more severe attacks of symptoms, no more episodes of numbness/dead arm like before PT   ? Time 3   ? Period Weeks   ? Status Achieved   ?  ? PT SHORT TERM GOAL #4  ? Title Will be able to name at  least 3 ways to improve ergonomic set up of work station and 3 techniques to relieve stress   ? Baseline 3/16- has new less stressful job, has started walking more to help wtih stress relief   ? Time 3   ? Period Weeks   ? Status Partially Met   ? ?  ?  ? ?  ? ? ? ? PT Long Term Goals - 04/06/21 0947   ? ?  ? PT LONG TERM GOAL #1  ? Title MMT to improve to 5/5 in all weak groups to reduce pain   ? Baseline 3/16- improving but L arm weak   ? Time 6   ? Period Weeks   ? Status On-going   ?  ? PT LONG TERM GOAL #2  ? Title Will be able to perform all work related tasks for unlimited duration with cervical and LUE pain no more than 1/10 and no feelings of arm "falling asleep"   ? Baseline 3/16- no more arm falling asleep, work does not increase symptoms   ? Time 6   ? Period Weeks   ? Status Achieved   ?  ? PT LONG TERM GOAL #3  ? Title Will be compliant with appropriate gym based exercise program to maintain functional gains and prevent recurrence of pain   ? Baseline progressing   ? Time 6   ? Period Weeks   ? Status On-going   ? ?  ?  ? ?  ? ? ? ? ? ? ? ? Plan - 04/27/21 1109   ? ? Clinical Impression Statement Erika Garrett arrives today with resolution of pain in levator area, she is still having some issues with knotting in anterior deltoid and subscapularis but her massage therapist has been working on these areas. Deferred ionto and STM today, focused primarily on strengthening and mm endurance. Has one more session left, will focus on finalizing HEP for her to use long term and likely plan on DC moving forward.   ? Personal Factors and Comorbidities Age;Past/Current Experience;Profession;Social  Background;Time since onset of injury/illness/exacerbation   ? Examination-Activity Limitations Reach Overhead;Carry;Caring for Others;Dressing;Lift   ? Examination-Participation Restrictions Occupation;Cleaning;Community Activity;Driving;Interpersonal Relationship;Laundry;Shop;Yard Work   ? Stability/Clinical Decision Making  Evolving/Moderate complexity   ? Clinical Decision Making Moderate   ? Rehab Potential Good   ? PT Frequency Other (comment)   ? PT Duration 4 weeks   ? PT Treatment/Interventions ADLs/Self Care Home Management;Cryotherapy;Electrical Stimulation;Iontophoresis 67m/ml Dexamethasone;Moist Heat;Ultrasound;Therapeutic activities;Therapeutic exercise;Patient/family education;Manual techniques;Passive range of motion;Dry needling;Taping   ? PT Next Visit Plan DC and finalize HEP   ? PT Home Exercise Plan [id number] plus red TB scap retractions, pec stretch over foam roller, adjusted pec stretch in door way, thoracic extensions over towel with UE flexion, shoulder extension/rows/ER with bands, tricep foam rolling, SCM stretches, home shoulder extensions and OH press with weights, blackburn 6, lats foam rolling, lats stretch   ? Consulted and Agree with Plan of Care Patient   ? ?  ?  ? ?  ? ? ?Patient will benefit from skilled therapeutic intervention in order to improve the following deficits and impairments:  Decreased range of motion, Increased fascial restricitons, Increased muscle spasms, Impaired UE functional use, Decreased activity tolerance, Pain, Hypomobility, Impaired flexibility, Improper body mechanics, Decreased mobility, Decreased strength, Postural dysfunction ? ?Visit Diagnosis: ?Abnormal posture ? ?Cramp and spasm ? ?Muscle weakness (generalized) ? ? ? ? ?Problem List ?Patient Active Problem List  ? Diagnosis Date Noted  ? maternal ovarian cancer  11/20/2019  ? Allergic contact dermatitis 08/23/2019  ? Chronic rhinitis 08/13/2019  ? Endometriosis of pelvis 06/25/2017  ? Subclinical hypothyroidism 06/25/2017  ? Obesity (BMI 30-39.9) 06/25/2017  ? 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 ?Erika Garrett U PT, DPT, PN2  ? ?Supplemental Physical Therapist ?[facility] ? ? ? ? ? ?[place] [address] ?[place] [address]?Phone: [phone]  Fax:   [phone]? ?Name: Erika Garrett?MRN: [id number]?Date of Birth: 903-04-1980? ? ? ?

## 2021-05-04 ENCOUNTER — Ambulatory Visit: Payer: BC Managed Care – PPO | Admitting: Physical Therapy

## 2021-05-04 ENCOUNTER — Encounter: Payer: Self-pay | Admitting: Physical Therapy

## 2021-05-04 DIAGNOSIS — R252 Cramp and spasm: Secondary | ICD-10-CM | POA: Diagnosis not present

## 2021-05-04 DIAGNOSIS — M6281 Muscle weakness (generalized): Secondary | ICD-10-CM

## 2021-05-04 DIAGNOSIS — R293 Abnormal posture: Secondary | ICD-10-CM | POA: Diagnosis not present

## 2021-05-04 NOTE — Therapy (Signed)
Erika [address] ?[place], [place], [id number] ?Phone: [phone]   Fax:  [phone] ? ?Physical Therapy Treatment ? ?Patient Details  ?Name: Erika [place] ?MRN: [id number] ?Date of Birth: 1981-01-09 ?Referring Provider (PT): Wendling ? ? ?Encounter Date: 05/04/2021 ? ?PHYSICAL THERAPY DISCHARGE SUMMARY ? ?Visits from Start of Care: 17 ? ?Current functional level related to goals / functional outcomes: ?See below  ?  ?Remaining deficits: ?See below  ?  ?Education / Equipment: ?See below   ? ?Patient agrees to discharge. Patient goals were met. Patient is being discharged due to being pleased with the current functional level. ? ? ? PT End of Session - 05/04/21 1015   ? ? Visit Number 17   ? Number of Visits 17   ? Date for PT Re-Evaluation 05/04/21   ? Authorization Type [employer]   ? Authorization Time Period 02/23/21 to 04/06/21; extended to 05/04/21   ? PT Start Time 5462   ? PT Stop Time 1005   no need for further skilled treatments, DC  ? PT Time Calculation (min) 30 min   ? Activity Tolerance Patient tolerated treatment well   ? Behavior During Therapy [facility] for tasks assessed/performed   ? ?  ?  ? ?  ? ? ?Past Medical History:  ?Diagnosis Date  ? Allergy   ? Anxiety   ? Back pain   ? Bilateral swelling of feet   ? Constipation   ? Depression   ? Elevated liver enzymes   ? Endometriosis   ? Hyperlipidemia   ? hx of, no medications  ? Hypertension 2016  ? Intercranial Hypertension  ? Intracranial hypertension   ? Joint pain   ? Other fatigue   ? 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 Shortness of breath on exertion   ? Subclinical hypothyroidism   ? Ventral hernia   ? Vitamin D deficiency   ? ? ?Past Surgical History:  ?Procedure Laterality Date  ? ABLATION ON ENDOMETRIOSIS    ? ROBOTIC ASSISTED LAPAROSCOPIC PARTIAL CYSTECTOMY    ? ? ?There were no vitals filed for this visit. ? ? Subjective Assessment - 05/04/21 0936   ? ? Subjective I'm doing much  better overall, the front of my shoulder still needs a lot of stretching and sometimes my shoulder blade gets really tender and sore. I know when to stretch it before it gets too out of control. I was doing good until today when I woke up and my allergies hit big time.   ? Patient Stated Goals get pain gone, be able to travel without difficulty   ? Currently in Pain? No/denies   ? ?  ?  ? ?  ? ? ? ? ? [facility] PT Assessment - 05/04/21 0001   ? ?  ? AROM  ? Cervical - Right Side Bend WNL   ? Cervical - Left Side Bend WNL   ?  ? Strength  ? Right Shoulder Flexion 5/5   ? Right Shoulder Extension 4+/5   ? Right Shoulder ABduction 5/5   ? Right Shoulder Internal Rotation 4+/5   ? Right Shoulder External Rotation 4+/5   ? Left Shoulder Flexion 4+/5   ? Left Shoulder Extension 4+/5   ? Left Shoulder ABduction 5/5   ? Left Shoulder Internal Rotation 4+/5   ? Left Shoulder External Rotation 4+/5   ? Right Elbow Flexion 4+/5   ? Right Elbow Extension 4+/5   ?  Left Elbow Flexion 4+/5   ? Left Elbow Extension 4+/5   ?  ? Palpation  ? Palpation comment much better, both sides WNL and R side actually tighter than L   ? ?  ?  ? ?  ? ? ? ? ? ? ? ? ? ? ? ? ? ? ? ? [facility] Adult PT Treatment/Exercise - 05/04/21 0001   ? ?  ? Neck Exercises: Machines for Strengthening  ? UBE (Upper Arm Bike) L4 x6 minuts backwards only   ?  ? Neck Exercises: Prone  ? Other Prone Exercise blackburn 6 1x10 0#   ? ?  ?  ? ?  ? ? ? ? ? ? ? ? ? ? PT Education - 05/04/21 1013   ? ? Education Details exam findings, review of complete HEP, DC today; encouraged continuing with HEP, massage therapist, trying rowing machine but caution with kettle bells (would see personal trainer to learn how to use these correctly so she does not aggravate her pain), continue to listen to her body in terms of aggravating vs alleviating sx and frequent movement at work to help keep sx under control   ? Person(s) Educated Patient   ? Methods Explanation   ? Comprehension Verbalized  understanding   ? ?  ?  ? ?  ? ? ? PT Short Term Goals - 05/04/21 0943   ? ?  ? PT SHORT TERM GOAL #1  ? Title Will be compliant with appropriate progressive HEP, to be updated PRN   ? Time 2   ? Period Weeks   ? Status Achieved   ?  ? PT SHORT TERM GOAL #2  ? Title Will demonstrate more upright posture  as well as improved general postural awareness at least 50% of the time   ? Baseline 4/13- met taking a lot of standing breaks   ? Time 3   ? Period Weeks   ? Status Achieved   ?  ? PT SHORT TERM GOAL #3  ? Title Frequency and intensity of severe bouts of pain in cervical spine and LUE will improve by at least 50% to show improvement of condition   ? Time 3   ? Period Weeks   ? Status Achieved   ?  ? PT SHORT TERM GOAL #4  ? Title Will be able to name at least 3 ways to improve ergonomic set up of work station and 3 techniques to relieve stress   ? Baseline 3/16- has new less stressful job, has started walking more to help wtih stress relief   ? Time 3   ? Period Weeks   ? Status Partially Met   ? ?  ?  ? ?  ? ? ? ? PT Long Term Goals - 05/04/21 0944   ? ?  ? PT LONG TERM GOAL #1  ? Title MMT to improve to 5/5 in all weak groups to reduce pain   ? Baseline 4/13- improving but not at goal level   ? Time 6   ? Period Weeks   ? Status Partially Met   ?  ? PT LONG TERM GOAL #2  ? Title Will be able to perform all work related tasks for unlimited duration with cervical and LUE pain no more than 1/10 and no feelings of arm "falling asleep"   ? Time 6   ? Period Weeks   ? Status Achieved   ?  ? PT LONG TERM GOAL #3  ?  Title Will be compliant with appropriate gym based exercise program to maintain functional gains and prevent recurrence of pain   ? Time 6   ? Period Weeks   ? Status Achieved   ? ?  ?  ? ?  ? ? ? ? ? ? ? ? Plan - 05/04/21 1015   ? ? Clinical Impression Statement Erika [place] arrives today doing well, we got some final measurements and reviewed final HEP. She has done a great job being compliant with appropriate and  progressive HEP, also has done a great job with postural mechanics and monitoring/performing regular movement during the day. I think she is ready for DC at this time- thank you for the referral!   ? Personal Factors and Comorbidities Age;Past/Current Experience;Profession;Social Background;Time since onset of injury/illness/exacerbation   ? Examination-Activity Limitations Reach Overhead;Carry;Caring for Others;Dressing;Lift   ? Examination-Participation Restrictions Occupation;Cleaning;Community Activity;Driving;Interpersonal Relationship;Laundry;Shop;Yard Work   ? Stability/Clinical Decision Making Evolving/Moderate complexity   ? Clinical Decision Making Moderate   ? Rehab Potential Good   ? PT Frequency Other (comment)   DC today  ? PT Duration Other (comment)   DC today  ? PT Treatment/Interventions ADLs/Self Care Home Management;Cryotherapy;Electrical Stimulation;Iontophoresis 66m/ml Dexamethasone;Moist Heat;Ultrasound;Therapeutic activities;Therapeutic exercise;Patient/family education;Manual techniques;Passive range of motion;Dry needling;Taping   ? PT Next Visit Plan DC today   ? PT Home Exercise Plan [id number] plus red TB scap retractions, pec stretch over foam roller, adjusted pec stretch in door way, thoracic extensions over towel with UE flexion, shoulder extension/rows/ER with bands, tricep foam rolling, SCM stretches, home shoulder extensions and OH press with weights, blackburn 6, lats foam rolling, lats stretch   ? Consulted and Agree with Plan of Care Patient   ? ?  ?  ? ?  ? ? ?Patient will benefit from skilled therapeutic intervention in order to improve the following deficits and impairments:  Decreased range of motion, Increased fascial restricitons, Increased muscle spasms, Impaired UE functional use, Decreased activity tolerance, Pain, Hypomobility, Impaired flexibility, Improper body mechanics, Decreased mobility, Decreased strength, Postural dysfunction ? ?Visit Diagnosis: ?Abnormal  posture ? ?Cramp and spasm ? ?Muscle weakness (generalized) ? ? ? ? ?Problem List ?Patient Active Problem List  ? Diagnosis Date Noted  ? maternal ovarian cancer  11/20/2019  ? Allergic contact dermatitis 08/23/18

## 2021-05-08 ENCOUNTER — Ambulatory Visit (INDEPENDENT_AMBULATORY_CARE_PROVIDER_SITE_OTHER): Payer: BC Managed Care – PPO | Admitting: Family Medicine

## 2021-05-08 ENCOUNTER — Encounter (INDEPENDENT_AMBULATORY_CARE_PROVIDER_SITE_OTHER): Payer: Self-pay | Admitting: Family Medicine

## 2021-05-08 VITALS — BP 123/85 | HR 82 | Temp 98.6°F | Ht 67.0 in | Wt 194.0 lb

## 2021-05-08 DIAGNOSIS — Z6830 Body mass index (BMI) 30.0-30.9, adult: Secondary | ICD-10-CM | POA: Diagnosis not present

## 2021-05-08 DIAGNOSIS — M545 Low back pain, unspecified: Secondary | ICD-10-CM | POA: Diagnosis not present

## 2021-05-08 DIAGNOSIS — E669 Obesity, unspecified: Secondary | ICD-10-CM | POA: Diagnosis not present

## 2021-05-18 NOTE — Progress Notes (Signed)
? ? ? ?Chief Complaint:  ? ?OBESITY ?Erika [place] is here to discuss her progress with her obesity treatment plan along with follow-up of her obesity related diagnoses. Erika [place] is on the Category 2 Plan or keeping a food journal and adhering to recommended goals of 1200-1500 calories and 85+ grams of protein daily and states she is following her eating plan approximately 90-95% of the time. Erika [place] states she is walking for 30-45 minutes 3-4 times per week. ? ?Today's visit was #: 13 ?Starting weight: 200 lbs ?Starting date: 03/17/2020 ?Today's weight: 194 lbs ?Today's date: 05/08/2021 ?Total lbs lost to date: 6 ?Total lbs lost since last in-office visit: 4 ? ?Interim History: Erika [place] has done well with weight loss. She is doing well with meal planning. She is working on getting her protein in, and she is trying not to snack in the evenings.  ? ?Subjective:  ? ?1. Low back pain, unspecified back pain laterality, unspecified chronicity, unspecified whether sciatica present ?Erika [place] has lower back pain and she is working on increasing stretching. She is getting massages done regularly as well. She feels she can stretch out her back reasonably well, but it tightens back up quickly. ? ?Assessment/Plan:  ? ?1. Low back pain, unspecified back pain laterality, unspecified chronicity, unspecified whether sciatica present ?Options for physical therapy were discussed. [name] will reconsider after the furniture market this Spring. ? ?2. Obesity BMI today is 30.5 ?Erika [place] is currently in the action stage of change. As such, her goal is to continue with weight loss efforts. She has agreed to keeping a food journal and adhering to recommended goals of 1300-1500 calories and 85+ grams of protein daily.  ? ?Exercise goals: As is. ? ?Behavioral modification strategies: increasing lean protein intake. ? ?Erika [place] has agreed to follow-up with our clinic in 4 weeks. She was informed of the importance of frequent follow-up visits to maximize her success with  intensive lifestyle modifications for her multiple health conditions.  ? ?Objective:  ? ?Blood pressure 123/85, pulse 82, temperature 98.6 ?F (37 ?C), height '5\' 7"'$  (1.702 m), weight 194 lb (88 kg), last menstrual period 11/27/2019, SpO2 99 %. ?Body mass index is 30.38 kg/m?. ? ?General: Cooperative, alert, well developed, in no acute distress. ?HEENT: Conjunctivae and lids unremarkable. ?Cardiovascular: Regular rhythm.  ?Lungs: Normal work of breathing. ?Neurologic: No focal deficits.  ? ?Lab Results  ?Component Value Date  ? CREATININE 0.75 02/27/2021  ? BUN 23 02/27/2021  ? NA 138 02/27/2021  ? K 4.5 02/27/2021  ? CL 102 02/27/2021  ? CO2 20 02/27/2021  ? ?Lab Results  ?Component Value Date  ? ALT 39 (H) 02/27/2021  ? AST 22 02/27/2021  ? ALKPHOS 94 02/27/2021  ? BILITOT 0.4 02/27/2021  ? ?Lab Results  ?Component Value Date  ? HGBA1C 5.2 10/10/2020  ? HGBA1C 5.2 03/17/2020  ? ?Lab Results  ?Component Value Date  ? INSULIN 10.6 10/10/2020  ? INSULIN 6.4 03/17/2020  ? ?Lab Results  ?Component Value Date  ? TSH 2.38 02/01/2020  ? ?Lab Results  ?Component Value Date  ? CHOL 223 (H) 02/27/2021  ? HDL 45 02/27/2021  ? LDLCALC 142 (H) 02/27/2021  ? LDLDIRECT 135.0 09/17/2018  ? TRIG 198 (H) 02/27/2021  ? CHOLHDL 5 11/04/2020  ? ?Lab Results  ?Component Value Date  ? VD25OH 43.6 02/27/2021  ? VD25OH 29.76 (L) 09/23/2020  ? VD25OH 33.06 02/01/2020  ? ?Lab Results  ?Component Value Date  ? WBC 9.3 09/23/2020  ? HGB 14.5 09/23/2020  ?  HCT 42.8 09/23/2020  ? MCV 91.6 09/23/2020  ? PLT 230.0 09/23/2020  ? ?No results found for: IRON, TIBC, FERRITIN ? ?Attestation Statements:  ? ?Reviewed by clinician on day of visit: allergies, medications, problem list, medical history, surgical history, family history, social history, and previous encounter notes. ? ?Time spent on visit including pre-visit chart review and post-visit care and charting was 25 minutes.  ? ? ?I, Trixie Dredge, am acting as transcriptionist for Dennard Nip,  MD. ? ?I have reviewed the above documentation for accuracy and completeness, and I agree with the above. -  Dennard Nip, MD ? ? ?

## 2021-05-21 ENCOUNTER — Other Ambulatory Visit (INDEPENDENT_AMBULATORY_CARE_PROVIDER_SITE_OTHER): Payer: Self-pay | Admitting: Family Medicine

## 2021-05-21 DIAGNOSIS — E559 Vitamin D deficiency, unspecified: Secondary | ICD-10-CM

## 2021-06-13 ENCOUNTER — Encounter (INDEPENDENT_AMBULATORY_CARE_PROVIDER_SITE_OTHER): Payer: Self-pay | Admitting: Family Medicine

## 2021-06-13 ENCOUNTER — Ambulatory Visit (INDEPENDENT_AMBULATORY_CARE_PROVIDER_SITE_OTHER): Payer: BC Managed Care – PPO | Admitting: Family Medicine

## 2021-06-13 VITALS — BP 129/82 | HR 70 | Temp 97.6°F | Ht 67.0 in | Wt 198.0 lb

## 2021-06-13 DIAGNOSIS — Z6831 Body mass index (BMI) 31.0-31.9, adult: Secondary | ICD-10-CM | POA: Diagnosis not present

## 2021-06-13 DIAGNOSIS — E669 Obesity, unspecified: Secondary | ICD-10-CM

## 2021-06-13 DIAGNOSIS — F3289 Other specified depressive episodes: Secondary | ICD-10-CM

## 2021-06-13 DIAGNOSIS — F32A Depression, unspecified: Secondary | ICD-10-CM | POA: Insufficient documentation

## 2021-06-13 MED ORDER — BUPROPION HCL ER (SR) 150 MG PO TB12: 150.0000 mg | ORAL_TABLET | Freq: Every day | ORAL | 0 refills | Status: DC

## 2021-06-26 NOTE — Progress Notes (Signed)
Chief Complaint:   OBESITY Erika [place] is here to discuss her progress with her obesity treatment plan along with follow-up of her obesity related diagnoses. Erika [place] is on keeping a food journal and adhering to recommended goals of 1300-1500 calories and 85+ grams of protein daily and states she is following her eating plan approximately 90% of the time. Erika [place] states she is walking 2-3 times per week.  Today's visit was #: 14 Starting weight: 200 lbs Starting date: 03/17/2020 Today's weight: 198 lbs Today's date: 06/13/2021 Total lbs lost to date: 2 Total lbs lost since last in-office visit: 0  Interim History: Erika [place] does well with following her plan when she eats at home, but she struggles more when she eats outs and struggles with increased snacking the second half of the day.   Subjective:   1. Other depression, with emotional eating Erika [place] notes increased emotional eating behaviors, worse in the second half of the day. She would like to avoid medications.   Assessment/Plan:   1. Other depression, with emotional eating Erika [place] agreed to start Wellbutrin SR 150 mg q AM with no refills. We discussed potential side effects in depth. She was given emotional eating behavior strategies to help decrease her emotional eating behaviors. We will continue to follow.   - buPROPion (WELLBUTRIN SR) 150 MG 12 hr tablet; Take 1 tablet (150 mg total) by mouth daily.  Dispense: 30 tablet; Refill: 0  2. Obesity, Current BMI 31.1 Erika [place] is currently in the action stage of change. As such, her goal is to continue with weight loss efforts. She has agreed to keeping a food journal and adhering to recommended goals of 1300-1500 calories and 85+ grams of protein daily.   Exercise goals: As is.   Behavioral modification strategies: increasing lean protein intake and emotional eating strategies.  Erika [place] has agreed to follow-up with our clinic in 4 weeks. She was informed of the importance of frequent follow-up visits to  maximize her success with intensive lifestyle modifications for her multiple health conditions.   Objective:   Blood pressure 129/82, pulse 70, temperature 97.6 F (36.4 C), height '5\' 7"'$  (1.702 m), weight 198 lb (89.8 kg), last menstrual period 11/27/2019, SpO2 99 %. Body mass index is 31.01 kg/m.  General: Cooperative, alert, well developed, in no acute distress. HEENT: Conjunctivae and lids unremarkable. Cardiovascular: Regular rhythm.  Lungs: Normal work of breathing. Neurologic: No focal deficits.   Lab Results  Component Value Date   CREATININE 0.75 02/27/2021   BUN 23 02/27/2021   NA 138 02/27/2021   K 4.5 02/27/2021   CL 102 02/27/2021   CO2 20 02/27/2021   Lab Results  Component Value Date   ALT 39 (H) 02/27/2021   AST 22 02/27/2021   ALKPHOS 94 02/27/2021   BILITOT 0.4 02/27/2021   Lab Results  Component Value Date   HGBA1C 5.2 10/10/2020   HGBA1C 5.2 03/17/2020   Lab Results  Component Value Date   INSULIN 10.6 10/10/2020   INSULIN 6.4 03/17/2020   Lab Results  Component Value Date   TSH 2.38 02/01/2020   Lab Results  Component Value Date   CHOL 223 (H) 02/27/2021   HDL 45 02/27/2021   LDLCALC 142 (H) 02/27/2021   LDLDIRECT 135.0 09/17/2018   TRIG 198 (H) 02/27/2021   CHOLHDL 5 11/04/2020   Lab Results  Component Value Date   VD25OH 43.6 02/27/2021   VD25OH 29.76 (L) 09/23/2020   VD25OH 33.06 02/01/2020   Lab Results  Component Value Date   WBC 9.3 09/23/2020   HGB 14.5 09/23/2020   HCT 42.8 09/23/2020   MCV 91.6 09/23/2020   PLT 230.0 09/23/2020   No results found for: IRON, TIBC, FERRITIN  Attestation Statements:   Reviewed by clinician on day of visit: allergies, medications, problem list, medical history, surgical history, family history, social history, and previous encounter notes.  Time spent on visit including pre-visit chart review and post-visit care and charting was 38 minutes.   I, Trixie Dredge, am acting as  transcriptionist for Dennard Nip, MD.  I have reviewed the above documentation for accuracy and completeness, and I agree with the above. -  Dennard Nip, MD

## 2021-07-10 ENCOUNTER — Encounter (INDEPENDENT_AMBULATORY_CARE_PROVIDER_SITE_OTHER): Payer: Self-pay | Admitting: Family Medicine

## 2021-07-10 ENCOUNTER — Ambulatory Visit (INDEPENDENT_AMBULATORY_CARE_PROVIDER_SITE_OTHER): Payer: BC Managed Care – PPO | Admitting: Family Medicine

## 2021-07-10 VITALS — BP 122/81 | HR 74 | Temp 97.8°F | Ht 67.0 in | Wt 197.0 lb

## 2021-07-10 DIAGNOSIS — E559 Vitamin D deficiency, unspecified: Secondary | ICD-10-CM | POA: Diagnosis not present

## 2021-07-10 DIAGNOSIS — E669 Obesity, unspecified: Secondary | ICD-10-CM

## 2021-07-10 DIAGNOSIS — F3289 Other specified depressive episodes: Secondary | ICD-10-CM

## 2021-07-10 DIAGNOSIS — Z9189 Other specified personal risk factors, not elsewhere classified: Secondary | ICD-10-CM

## 2021-07-10 DIAGNOSIS — Z6831 Body mass index (BMI) 31.0-31.9, adult: Secondary | ICD-10-CM

## 2021-07-10 MED ORDER — VITAMIN D (ERGOCALCIFEROL) 1.25 MG (50000 UNIT) PO CAPS: 50000.0000 [IU] | ORAL_CAPSULE | ORAL | 1 refills | Status: DC

## 2021-07-11 NOTE — Progress Notes (Signed)
Chief Complaint:   OBESITY Erika [place] is here to discuss her progress with her obesity treatment plan along with follow-up of her obesity related diagnoses. Erika [place] is on keeping a food journal and adhering to recommended goals of 1300-1500 calories and 85+ grams of protein daily and states she is following her eating plan approximately 95% of the time. Erika [place] states she is doing cardio and weights for 45 minutes 7 times per week.  Today's visit was #: 15 Starting weight: 200 lbs Starting date: 03/17/2020 Today's weight: 197 lbs Today's date: 07/10/2021 Total lbs lost to date: 3 Total lbs lost since last in-office visit: 1  Interim History: Erika [place] has been doing better with weight loss.  Her next weight goal is a BMI below 30, which is at least 6 pounds less than her current weight.  She has significantly increased her exercise and has especially increased strengthening exercise.  Subjective:   1. Vitamin D deficiency Erika [place] is on vitamin D prescription, but her level is not yet at goal.  No side effects were noted.  2. Other depression, with emotional eating Erika [place] did not take Wellbutrin as she has forgotten she had had an emotional reaction in her early 22s.  She would like to take a supplement called "dopa pure".  3. At risk for diabetes mellitus Erika [place] is at higher than average risk for developing diabetes due to her obesity.  Assessment/Plan:   1. Vitamin D deficiency Erika [place] will continue prescription vitamin D 50,000 units weekly, and we will refill for 2 months.  - Vitamin D, Ergocalciferol, (DRISDOL) 1.25 MG (50000 UNIT) CAPS capsule; Take 1 capsule (50,000 Units total) by mouth every 7 (seven) days.  Dispense: 4 capsule; Refill: 1  2. Other depression, with emotional eating Erika [place] agreed to discontinue Wellbutrin, and we will continue to monitor.  3. At risk for diabetes mellitus Erika [place] was given approximately 15 minutes of diabetic education and counseling today. We discussed intensive  lifestyle modifications today with an emphasis on weight loss as well as increasing exercise and decreasing simple carbohydrates in her diet. We also reviewed medication options with an emphasis on risk versus benefits of those discussed.  Repetitive spaced learning was employed today to elicit superior memory formation and behavioral change.  4. Obesity, Current BMI 31.0 Erika [place] is currently in the action stage of change. As such, her goal is to continue with weight loss efforts. She has agreed to keeping a food journal and adhering to recommended goals of 1300-1500 calories and 85+ grams of protein daily.   We will recheck fasting labs at her next visit.  Exercise goals: As is.   Behavioral modification strategies: increasing lean protein intake and meal planning and cooking strategies.  Erika [place] has agreed to follow-up with our clinic in 3 to 4 weeks. She was informed of the importance of frequent follow-up visits to maximize her success with intensive lifestyle modifications for her multiple health conditions.   Objective:   Blood pressure 122/81, pulse 74, temperature 97.8 F (36.6 C), height '5\' 7"'$  (1.702 m), weight 197 lb (89.4 kg), last menstrual period 11/27/2019, SpO2 99 %. Body mass index is 30.85 kg/m.  General: Cooperative, alert, well developed, in no acute distress. HEENT: Conjunctivae and lids unremarkable. Cardiovascular: Regular rhythm.  Lungs: Normal work of breathing. Neurologic: No focal deficits.   Lab Results  Component Value Date   CREATININE 0.75 02/27/2021   BUN 23 02/27/2021   NA 138 02/27/2021   K 4.5 02/27/2021  CL 102 02/27/2021   CO2 20 02/27/2021   Lab Results  Component Value Date   ALT 39 (H) 02/27/2021   AST 22 02/27/2021   ALKPHOS 94 02/27/2021   BILITOT 0.4 02/27/2021   Lab Results  Component Value Date   HGBA1C 5.2 10/10/2020   HGBA1C 5.2 03/17/2020   Lab Results  Component Value Date   INSULIN 10.6 10/10/2020   INSULIN 6.4 03/17/2020    Lab Results  Component Value Date   TSH 2.38 02/01/2020   Lab Results  Component Value Date   CHOL 223 (H) 02/27/2021   HDL 45 02/27/2021   LDLCALC 142 (H) 02/27/2021   LDLDIRECT 135.0 09/17/2018   TRIG 198 (H) 02/27/2021   CHOLHDL 5 11/04/2020   Lab Results  Component Value Date   VD25OH 43.6 02/27/2021   VD25OH 29.76 (L) 09/23/2020   VD25OH 33.06 02/01/2020   Lab Results  Component Value Date   WBC 9.3 09/23/2020   HGB 14.5 09/23/2020   HCT 42.8 09/23/2020   MCV 91.6 09/23/2020   PLT 230.0 09/23/2020   No results found for: "IRON", "TIBC", "FERRITIN"  Attestation Statements:   Reviewed by clinician on day of visit: allergies, medications, problem list, medical history, surgical history, family history, social history, and previous encounter notes.   I, Trixie Dredge, am acting as transcriptionist for Dennard Nip, MD.  I have reviewed the above documentation for accuracy and completeness, and I agree with the above. -  Dennard Nip, MD

## 2021-08-10 ENCOUNTER — Ambulatory Visit (INDEPENDENT_AMBULATORY_CARE_PROVIDER_SITE_OTHER): Payer: BC Managed Care – PPO | Admitting: Family Medicine

## 2021-08-10 ENCOUNTER — Encounter (INDEPENDENT_AMBULATORY_CARE_PROVIDER_SITE_OTHER): Payer: Self-pay | Admitting: Family Medicine

## 2021-08-10 VITALS — BP 110/75 | HR 58 | Temp 97.7°F | Ht 67.0 in | Wt 197.0 lb

## 2021-08-10 DIAGNOSIS — E782 Mixed hyperlipidemia: Secondary | ICD-10-CM | POA: Diagnosis not present

## 2021-08-10 DIAGNOSIS — R7989 Other specified abnormal findings of blood chemistry: Secondary | ICD-10-CM | POA: Diagnosis not present

## 2021-08-10 DIAGNOSIS — Z6830 Body mass index (BMI) 30.0-30.9, adult: Secondary | ICD-10-CM

## 2021-08-10 DIAGNOSIS — E8881 Metabolic syndrome: Secondary | ICD-10-CM

## 2021-08-10 DIAGNOSIS — E559 Vitamin D deficiency, unspecified: Secondary | ICD-10-CM

## 2021-08-10 DIAGNOSIS — E038 Other specified hypothyroidism: Secondary | ICD-10-CM | POA: Diagnosis not present

## 2021-08-10 DIAGNOSIS — E039 Hypothyroidism, unspecified: Secondary | ICD-10-CM

## 2021-08-10 DIAGNOSIS — R7303 Prediabetes: Secondary | ICD-10-CM | POA: Diagnosis not present

## 2021-08-10 DIAGNOSIS — E669 Obesity, unspecified: Secondary | ICD-10-CM

## 2021-08-11 LAB — HEMOGLOBIN A1C
Est. average glucose Bld gHb Est-mCnc: 100 mg/dL
Hgb A1c MFr Bld: 5.1 % (ref 4.8–5.6)

## 2021-08-11 LAB — LIPID PANEL WITH LDL/HDL RATIO
Cholesterol, Total: 192 mg/dL (ref 100–199)
HDL: 35 mg/dL — ABNORMAL LOW (ref >39)
LDL Chol Calc (NIH): 121 mg/dL — ABNORMAL HIGH (ref 0–99)
LDL/HDL Ratio: 3.5 ratio — ABNORMAL HIGH (ref 0.0–3.2)
Triglycerides: 202 mg/dL — ABNORMAL HIGH (ref 0–149)
VLDL Cholesterol Cal: 36 mg/dL (ref 5–40)

## 2021-08-11 LAB — TSH: TSH: 2.64 u[IU]/mL (ref 0.450–4.500)

## 2021-08-11 LAB — CMP14+EGFR
ALT: 25 IU/L (ref 0–32)
AST: 23 IU/L (ref 0–40)
Albumin/Globulin Ratio: 2.4 — ABNORMAL HIGH (ref 1.2–2.2)
Albumin: 4.7 g/dL (ref 3.9–4.9)
Alkaline Phosphatase: 88 IU/L (ref 44–121)
BUN/Creatinine Ratio: 29 — ABNORMAL HIGH (ref 9–23)
BUN: 20 mg/dL (ref 6–24)
Bilirubin Total: 0.3 mg/dL (ref 0.0–1.2)
CO2: 19 mmol/L — ABNORMAL LOW (ref 20–29)
Calcium: 9.3 mg/dL (ref 8.7–10.2)
Chloride: 102 mmol/L (ref 96–106)
Creatinine, Ser: 0.68 mg/dL (ref 0.57–1.00)
Globulin, Total: 2 g/dL (ref 1.5–4.5)
Glucose: 89 mg/dL (ref 70–99)
Potassium: 4.6 mmol/L (ref 3.5–5.2)
Sodium: 140 mmol/L (ref 134–144)
Total Protein: 6.7 g/dL (ref 6.0–8.5)
eGFR: 112 mL/min/{1.73_m2} (ref >59)

## 2021-08-11 LAB — INSULIN, RANDOM: INSULIN: 6.8 u[IU]/mL (ref 2.6–24.9)

## 2021-08-11 LAB — VITAMIN D 25 HYDROXY (VIT D DEFICIENCY, FRACTURES): Vit D, 25-Hydroxy: 44.7 ng/mL (ref 30.0–100.0)

## 2021-08-16 NOTE — Progress Notes (Signed)
Chief Complaint:   OBESITY Erika Garrett is here to discuss her progress with her obesity treatment plan along with follow-up of her obesity related diagnoses. Erika Garrett is on keeping a food journal and adhering to recommended goals of 1300-1500 calories and 85+ grams of protein daily and states she is following her eating plan approximately 90% of the time. [name] states she is doing cardio and weights for 60 minutes 7 times per week.  Today's visit was #: 51 Starting weight: 200 lbs Starting date: 03/17/2020 Today's weight: 197 lbs Today's date: 08/10/2021 Total lbs lost to date: 3 Total lbs lost since last in-office visit: 0  Interim History: [name] continues to do well with journaling and exercise.  Her muscle mass has been increasing and her fat percentage has decreased.  Her total weight is the same but she is doing well.  Subjective:   1. Vitamin D deficiency Erika Garrett is on vitamin D, and she is due to have labs drawn.  She denies nausea or vomiting.  2. Hypothyroidism, unspecified type Erika Garrett has been off Synthroid as her levels were borderline over replaced.  3. Mixed hyperlipidemia Erika Garrett is working on decreasing cholesterol in her diet.  She denies chest pain, and she is not on statin.  4. LFT elevation Erika Garrett's last LFT was mildly elevated.  She is working on weight loss.  5. Insulin resistance Erika Garrett is doing very well with decreasing simple carbohydrates.  She has no signs of hypoglycemia.  Assessment/Plan:   1. Vitamin D deficiency We will check labs today. Erika Garrett will follow-up for routine testing of Vitamin D, at least 2-3 times per year to avoid over-replacement.  - VITAMIN D 25 Hydroxy (Vit-D Deficiency, Fractures)  2. Hypothyroidism, unspecified type We will check labs today, we will follow-up at [name]'s next visit. Orders and follow up as documented in patient record.  - TSH  3. Mixed hyperlipidemia We will check labs today. Erika Garrett will continue to work on diet, exercise and  weight loss efforts. Orders and follow up as documented in patient record.   - Lipid Panel With LDL/HDL Ratio  4. LFT elevation We will check labs today. Erika Garrett agreed to continue with her weight loss efforts with healthier diet and exercise as an essential part of her treatment plan.  - CMP14+EGFR  5. Insulin resistance We will check labs today. Erika Garrett will continue to work on weight loss, exercise, and decreasing simple carbohydrates to help decrease the risk of diabetes. Erika Garrett agreed to follow-up with Erika Garrett as directed to closely monitor her progress.  - Insulin, random - Hemoglobin A1c  6. Obesity, Current BMI 30.9 Rolinda is currently in the action stage of change. As such, her goal is to continue with weight loss efforts. She has agreed to keeping a food journal and adhering to recommended goals of 1300-1500 calories and 85+ grams of protein daily.   Exercise goals: As is.   Behavioral modification strategies: increasing lean protein intake.  Erika Garrett has agreed to follow-up with our clinic in 4 weeks. She was informed of the importance of frequent follow-up visits to maximize her success with intensive lifestyle modifications for her multiple health conditions.   Erika Garrett was informed we would discuss her lab results at her next visit unless there is a critical issue that needs to be addressed sooner. Erika Garrett agreed to keep her next visit at the agreed upon time to discuss these results.  Objective:   Blood pressure 110/75, pulse (!) 58, temperature 97.7 F (36.5 C),  height _0  (1.702 m), weight 197 lb (89.4 kg), last menstrual period 11/27/2019, SpO2 96 %. Body mass index is 30.85 kg/m.  General: Cooperative, alert, well developed, in no acute distress. HEENT: Conjunctivae and lids unremarkable. Cardiovascular: Regular rhythm.  Lungs: Normal work of breathing. Neurologic: No focal deficits.   Lab Results  Component Value Date   CREATININE 0.68 08/10/2021   BUN 20 08/10/2021   NA 140  08/10/2021   K 4.6 08/10/2021   CL 102 08/10/2021   CO2 19 (L) 08/10/2021   Lab Results  Component Value Date   ALT 25 08/10/2021   AST 23 08/10/2021   ALKPHOS 88 08/10/2021   BILITOT 0.3 08/10/2021   Lab Results  Component Value Date   HGBA1C 5.1 08/10/2021   HGBA1C 5.2 10/10/2020   HGBA1C 5.2 03/17/2020   Lab Results  Component Value Date   INSULIN 6.8 08/10/2021   INSULIN 10.6 10/10/2020   INSULIN 6.4 03/17/2020   Lab Results  Component Value Date   TSH 2.640 08/10/2021   Lab Results  Component Value Date   CHOL 192 08/10/2021   HDL 35 (L) 08/10/2021   LDLCALC 121 (H) 08/10/2021   LDLDIRECT 135.0 09/17/2018   TRIG 202 (H) 08/10/2021   CHOLHDL 5 11/04/2020   Lab Results  Component Value Date   VD25OH 44.7 08/10/2021   VD25OH 43.6 02/27/2021   VD25OH 29.76 (L) 09/23/2020   Lab Results  Component Value Date   WBC 9.3 09/23/2020   HGB 14.5 09/23/2020   HCT 42.8 09/23/2020   MCV 91.6 09/23/2020   PLT 230.0 09/23/2020   No results found for: "IRON", "TIBC", "FERRITIN"  Attestation Statements:   Reviewed by clinician on day of visit: allergies, medications, problem list, medical history, surgical history, family history, social history, and previous encounter notes.  I, Trixie Dredge, am acting as transcriptionist for Dennard Nip, MD.  I have reviewed the above documentation for accuracy and completeness, and I agree with the above. -  Dennard Nip, MD

## 2021-08-30 ENCOUNTER — Encounter (INDEPENDENT_AMBULATORY_CARE_PROVIDER_SITE_OTHER): Payer: Self-pay

## 2021-09-21 ENCOUNTER — Ambulatory Visit (INDEPENDENT_AMBULATORY_CARE_PROVIDER_SITE_OTHER): Payer: BC Managed Care – PPO | Admitting: Family Medicine

## 2021-09-22 ENCOUNTER — Encounter: Payer: BC Managed Care – PPO | Admitting: Family Medicine

## 2021-10-12 ENCOUNTER — Encounter: Payer: Self-pay | Admitting: Family Medicine

## 2021-10-12 ENCOUNTER — Ambulatory Visit (INDEPENDENT_AMBULATORY_CARE_PROVIDER_SITE_OTHER): Payer: BC Managed Care – PPO | Admitting: Family Medicine

## 2021-10-12 VITALS — BP 124/80 | HR 68 | Temp 97.9°F | Ht 67.0 in | Wt 201.8 lb

## 2021-10-12 DIAGNOSIS — S46812D Strain of other muscles, fascia and tendons at shoulder and upper arm level, left arm, subsequent encounter: Secondary | ICD-10-CM

## 2021-10-12 DIAGNOSIS — E782 Mixed hyperlipidemia: Secondary | ICD-10-CM | POA: Diagnosis not present

## 2021-10-12 DIAGNOSIS — Z0000 Encounter for general adult medical examination without abnormal findings: Secondary | ICD-10-CM

## 2021-10-12 DIAGNOSIS — R7989 Other specified abnormal findings of blood chemistry: Secondary | ICD-10-CM | POA: Diagnosis not present

## 2021-10-12 DIAGNOSIS — Z23 Encounter for immunization: Secondary | ICD-10-CM | POA: Diagnosis not present

## 2021-10-12 LAB — CBC
HCT: 44.1 % (ref 36.0–46.0)
Hemoglobin: 15.2 g/dL — ABNORMAL HIGH (ref 12.0–15.0)
MCHC: 34.5 g/dL (ref 30.0–36.0)
MCV: 92.3 fl (ref 78.0–100.0)
Platelets: 230 10*3/uL (ref 150.0–400.0)
RBC: 4.78 Mil/uL (ref 3.87–5.11)
RDW: 12.7 % (ref 11.5–15.5)
WBC: 9.7 10*3/uL (ref 4.0–10.5)

## 2021-10-12 LAB — LIPID PANEL
Cholesterol: 224 mg/dL — ABNORMAL HIGH (ref 0–200)
HDL: 40.9 mg/dL (ref >39.00)
NonHDL: 183.37
Total CHOL/HDL Ratio: 5
Triglycerides: 241 mg/dL — ABNORMAL HIGH (ref 0.0–149.0)
VLDL: 48.2 mg/dL — ABNORMAL HIGH (ref 0.0–40.0)

## 2021-10-12 LAB — COMPREHENSIVE METABOLIC PANEL
ALT: 52 U/L — ABNORMAL HIGH (ref 0–35)
AST: 32 U/L (ref 0–37)
Albumin: 4.4 g/dL (ref 3.5–5.2)
Alkaline Phosphatase: 84 U/L (ref 39–117)
BUN: 18 mg/dL (ref 6–23)
CO2: 27 mEq/L (ref 19–32)
Calcium: 9.5 mg/dL (ref 8.4–10.5)
Chloride: 103 mEq/L (ref 96–112)
Creatinine, Ser: 0.71 mg/dL (ref 0.40–1.20)
GFR: 105.18 mL/min (ref >60.00)
Glucose, Bld: 93 mg/dL (ref 70–99)
Potassium: 4.5 mEq/L (ref 3.5–5.1)
Sodium: 138 mEq/L (ref 135–145)
Total Bilirubin: 0.6 mg/dL (ref 0.2–1.2)
Total Protein: 6.8 g/dL (ref 6.0–8.3)

## 2021-10-12 LAB — LDL CHOLESTEROL, DIRECT: Direct LDL: 159 mg/dL

## 2021-10-12 NOTE — Patient Instructions (Signed)
Give [name] 2-3 business days to get the results of your labs back.   Keep the diet clean and stay active.  If you do not hear anything about your referral in the next 1-2 weeks, call our office and ask for an update.  Please get me a copy of your advanced directive form at your convenience.   If you change your mind about seeing a plastic surgeon, let me know.   Let us know if you need anything.

## 2021-10-12 NOTE — Progress Notes (Signed)
Chief Complaint  Patient presents with   Annual Exam    CPE-fasting      Well Woman Erika Garrett is here for a complete physical.   Her last physical was >1 year ago.  Current diet: in general, a "healthy" diet. Current exercise: walking. Weight is stable and she denies fatigue out of ordinary. Seatbelt? Yes Advanced directive? No  Health Maintenance Mammogram- Yes Tetanus- Yes Hep C screening- Yes HIV screening- Yes  Neck/shoulder pain For a little over a year, the patient has had left shoulder/neck pain.  There is no initial injury or change in activity.  She was given home stretches/exercises and has been seeing a massage therapist.  She went to physical therapy as well.  These did help her but it seems to keep recurring.  She has not in this area in addition to intermittent burning down her left arm.  No obvious weakness.  Her massage therapist wonders if her breasts are too large.  The patient reports she carries a lot of her stress in her shoulders as well.  Past Medical History:  Diagnosis Date   Allergy    Anxiety    Back pain    Bilateral swelling of feet    Constipation    Depression    Elevated liver enzymes    Endometriosis    Hyperlipidemia    hx of, no medications   Hypertension 2016   Intercranial Hypertension   Intracranial hypertension    Joint pain    Other fatigue    Pseudotumor cerebri induced by OCPs 05/22/2017   Shortness of breath on exertion    Subclinical hypothyroidism    Ventral hernia    Vitamin D deficiency      Past Surgical History:  Procedure Laterality Date   ABLATION ON ENDOMETRIOSIS     ROBOTIC ASSISTED LAPAROSCOPIC PARTIAL CYSTECTOMY      Medications  Current Outpatient Medications on File Prior to Visit  Medication Sig Dispense Refill   Cetirizine HCl (ZYRTEC PO) Take 1 tablet by mouth daily.     MAGNESIUM PO Take by mouth. Brand: Pure Encapsulate     Multiple Vitamins-Minerals (MULTIVITAMIN PO) Take 1 tablet by mouth  daily.     Omega-3 Fatty Acids (FISH OIL) 1000 MG CAPS Take by mouth.     OVER THE COUNTER MEDICATION Take 1 each by mouth daily. Take 1 Zinc gummy by mouth once daily.     OVER THE COUNTER MEDICATION Cholestepure Vitamin 2 a day  Brand: pure Encapsulatiens     Vitamin D, Cholecalciferol, 25 MCG (1000 UT) TABS Take 2 tablets by mouth daily.     Vitamin D, Ergocalciferol, (DRISDOL) 1.25 MG (50000 UNIT) CAPS capsule Take 1 capsule (50,000 Units total) by mouth every 7 (seven) days. 4 capsule 1   Allergies Allergies  Allergen Reactions   Estrogens     BIH   Zithromax [Azithromycin] Hives   Doxycycline Other (See Comments)    IIH hx, can raise ICP   Levonorgestrel-Ethinyl Estrad Other (See Comments)    Review of Systems: Constitutional:  no unexpected weight changes Eye:  no recent significant change in vision Ear/Nose/Mouth/Throat:  Ears:  no recent change in hearing Nose/Mouth/Throat:  no complaints of nasal congestion, no sore throat Cardiovascular: no chest pain Respiratory:  no shortness of breath Gastrointestinal:  no abdominal pain, no change in bowel habits GU:  Female: negative for dysuria or pelvic pain Musculoskeletal/Extremities:  +L upper neck/shoulder pain Integumentary (Skin/Breast):  no abnormal skin lesions  reported Neurologic:  no headaches Endocrine:  denies fatigue Hematologic/Lymphatic:  No areas of easy bleeding  Exam BP 124/80   Pulse 68   Temp 97.9 F (36.6 C) (Oral)   Ht '5\' 7"'$  (1.702 m)   Wt 201 lb 12.8 oz (91.5 kg)   LMP 11/27/2019   SpO2 98%   BMI 31.61 kg/m  General:  well developed, well nourished, in no apparent distress Skin:  no significant moles, warts, or growths Head:  no masses, lesions, or tenderness Eyes:  pupils equal and round, sclera anicteric without injection Ears:  canals without lesions, TMs shiny without retraction, no obvious effusion, no erythema Nose:  nares patent, mucosa normal, and no drainage Throat/Pharynx:  lips and  gingiva without lesion; tongue and uvula midline; non-inflamed pharynx; no exudates or postnasal drainage Neck: neck supple without adenopathy, thyromegaly, or masses Lungs:  clear to auscultation, breath sounds equal bilaterally, no respiratory distress Cardio:  regular rate and rhythm, no LE edema Abdomen:  abdomen soft, nontender; bowel sounds normal; no masses or organomegaly Genital: Defer to GYN Musculoskeletal: hypertonicity of lateral neck and trap on L, +Ttp over these areas; symmetrical muscle groups noted without atrophy or deformity Extremities:  no clubbing, cyanosis, or edema, no deformities, no skin discoloration Neuro:  gait normal; deep tendon reflexes normal and symmetric Psych: well oriented with normal range of affect and appropriate judgment/insight  Assessment and Plan  Well adult exam - Plan: CBC, Comprehensive metabolic panel, Lipid panel  Strain of left trapezius muscle, subsequent encounter - Plan: Ambulatory referral to Sports Medicine  Need for immunization against influenza - Plan: Flu Vaccine QUAD 45moIM (Fluarix, Fluzone & Alfiuria Quad PF)   Well 41y.o. female. Counseled on diet and exercise. Advanced directive form provided today.  Flu shot today. Trap strain: chronic, recurrent. Refer to sports med. Offered referral to plastics to discuss breast reduction as I do believe she would benefit. She is hesitant on this and will let me know if she changes her mind. Heat, ice, cont HEP.  Other orders as above. Follow up in 1 yr. The patient voiced understanding and agreement to the plan.  NGerrard DO 10/12/21 10:27 AM

## 2021-10-13 NOTE — Addendum Note (Signed)
Addended by: Jeronimo Greaves on: 10/13/2021 08:34 AM   Modules accepted: Orders

## 2021-10-16 DIAGNOSIS — H524 Presbyopia: Secondary | ICD-10-CM | POA: Diagnosis not present

## 2021-10-16 DIAGNOSIS — G932 Benign intracranial hypertension: Secondary | ICD-10-CM | POA: Diagnosis not present

## 2021-10-19 ENCOUNTER — Encounter (INDEPENDENT_AMBULATORY_CARE_PROVIDER_SITE_OTHER): Payer: Self-pay | Admitting: Family Medicine

## 2021-10-19 ENCOUNTER — Ambulatory Visit (INDEPENDENT_AMBULATORY_CARE_PROVIDER_SITE_OTHER): Payer: BC Managed Care – PPO | Admitting: Family Medicine

## 2021-10-19 VITALS — BP 108/72 | HR 63 | Temp 98.1°F | Ht 67.0 in | Wt 194.0 lb

## 2021-10-19 DIAGNOSIS — R0602 Shortness of breath: Secondary | ICD-10-CM

## 2021-10-19 DIAGNOSIS — E559 Vitamin D deficiency, unspecified: Secondary | ICD-10-CM

## 2021-10-19 DIAGNOSIS — Z6830 Body mass index (BMI) 30.0-30.9, adult: Secondary | ICD-10-CM

## 2021-10-19 DIAGNOSIS — E782 Mixed hyperlipidemia: Secondary | ICD-10-CM | POA: Diagnosis not present

## 2021-10-19 DIAGNOSIS — R7989 Other specified abnormal findings of blood chemistry: Secondary | ICD-10-CM | POA: Diagnosis not present

## 2021-10-19 DIAGNOSIS — E669 Obesity, unspecified: Secondary | ICD-10-CM

## 2021-10-19 MED ORDER — VITAMIN D (ERGOCALCIFEROL) 1.25 MG (50000 UNIT) PO CAPS: 50000.0000 [IU] | ORAL_CAPSULE | ORAL | 2 refills | Status: DC

## 2021-10-24 ENCOUNTER — Ambulatory Visit: Payer: BC Managed Care – PPO | Admitting: Family Medicine

## 2021-10-24 ENCOUNTER — Encounter: Payer: Self-pay | Admitting: Family Medicine

## 2021-10-24 ENCOUNTER — Ambulatory Visit (HOSPITAL_BASED_OUTPATIENT_CLINIC_OR_DEPARTMENT_OTHER)
Admission: RE | Admit: 2021-10-24 | Discharge: 2021-10-24 | Disposition: A | Discharge status: Home / Self Care | Payer: BC Managed Care – PPO | Source: Ambulatory Visit | Attending: Family Medicine | Admitting: Family Medicine

## 2021-10-24 VITALS — BP 110/80 | Ht 67.0 in | Wt 194.0 lb

## 2021-10-24 DIAGNOSIS — M5412 Radiculopathy, cervical region: Secondary | ICD-10-CM | POA: Insufficient documentation

## 2021-10-24 DIAGNOSIS — M25512 Pain in left shoulder: Secondary | ICD-10-CM | POA: Diagnosis not present

## 2021-10-24 DIAGNOSIS — M542 Cervicalgia: Secondary | ICD-10-CM | POA: Diagnosis not present

## 2021-10-24 MED ORDER — MELOXICAM 15 MG PO TABS: 15.0000 mg | ORAL_TABLET | Freq: Every day | ORAL | 1 refills | Status: DC | PRN

## 2021-10-24 NOTE — Assessment & Plan Note (Signed)
Acute on chronic in nature.  Symptoms seem more consistent with a nerve impingement with having previous pain down the left arm.  She has tried physical therapy. -Counseled on home exercise therapy and supportive care. -Meloxicam. -X-ray. -Could consider further imaging.

## 2021-10-24 NOTE — Patient Instructions (Signed)
Nice to meet you Please continue heat  Please try the exercises   I will call with the results from today  Please send me a message in MyChart with any questions or updates.  Please see me back in 4 weeks.   --Dr. Raeford Razor

## 2021-10-24 NOTE — Progress Notes (Signed)
  Erika Garrett - 41 y.o. female MRN [id number]  Date of birth: [birth year]  SUBJECTIVE:  Including CC & ROS.  No chief complaint on file.   Erika Garrett is a 41 y.o. female that is presenting with acute on chronic left-sided neck and shoulder pain.  The pain has been ongoing for over a year.  It seems to be worse with certain movements.  She does get massages on a regular basis.  No history of similar pain or surgery.    Review of Systems See HPI   HISTORY: Past Medical, Surgical, Social, and Family History Reviewed & Updated per EMR.   Pertinent Historical Findings include:  Past Medical History:  Diagnosis Date   Allergy    Anxiety    Back pain    Bilateral swelling of feet    Constipation    Depression    Elevated liver enzymes    Endometriosis    Hyperlipidemia    hx of, no medications   Hypertension 2016   Intercranial Hypertension   Intracranial hypertension    Joint pain    Other fatigue    Pseudotumor cerebri induced by OCPs 05/22/2017   Shortness of breath on exertion    Subclinical hypothyroidism    Ventral hernia    Vitamin D deficiency     Past Surgical History:  Procedure Laterality Date   ABLATION ON ENDOMETRIOSIS     ROBOTIC ASSISTED LAPAROSCOPIC PARTIAL CYSTECTOMY       PHYSICAL EXAM:  VS: BP 110/80 (BP Location: Left Arm, Patient Position: Sitting)   Ht '5\' 7"'$  (1.702 m)   Wt 194 lb (88 kg)   LMP 11/27/2019   BMI 30.38 kg/m  Physical Exam Gen: NAD, alert, cooperative with exam, well-appearing MSK:  Neurovascularly intact       ASSESSMENT & PLAN:   Cervical radiculopathy Acute on chronic in nature.  Symptoms seem more consistent with a nerve impingement with having previous pain down the left arm.  She has tried physical therapy. -Counseled on home exercise therapy and supportive care. -Meloxicam. -X-ray. -Could consider further imaging.

## 2021-10-24 NOTE — Progress Notes (Signed)
Chief Complaint:   OBESITY Erika Garrett is here to discuss her progress with her obesity treatment plan along with follow-up of her obesity related diagnoses. Erika Garrett is on keeping a food journal and adhering to recommended goals of 1300-1500 calories and 85+ grams of protein daily and states she is following her eating plan approximately 70% of the time. Erika Garrett states she is walking for 30 minutes 4-5 times per week.  Today's visit was #: 43 Starting weight: 200 lbs Starting date: 03/17/2020 Today's weight: 194 lbs Today's date: 10/19/2021 Total lbs lost to date: 6 Total lbs lost since last in-office visit: 3  Interim History: Erika Garrett reports shifted to low-cholesterol diet.  She was advised to make sure she is meeting her protein needs.  Discussed her RMR has decreased by 600 cal.  We discussed that we suspect her protein is too low, and she wants to avoid daily.  Subjective:   1. Mixed hyperlipidemia Erika Garrett is concerned about her cholesterol and triglycerides being up at her last visit with her PCP.  She is trying to decrease her cholesterol and fat in her diet.  She is not on statin.  2. LFT elevation Erika Garrett's ALT is back up to 52.  3. Vitamin D deficiency Erika Garrett is tolerating prescription vitamin D 50,000 units once weekly with no side effects.  4. SOBOE (shortness of breath on exertion) Erika Garrett's IC symptoms are unchanged.  She is due to have her IC repeated.  Assessment/Plan:   1. Mixed hyperlipidemia Gaylin will continue with her diet and exercise, and we will recheck labs in 3 months.  2. LFT elevation Erika Garrett will continue with her diet and exercise, and we will recheck labs in 3 months.  3. Vitamin D deficiency We will refill prescription Vitamin D for 90 days. She will follow-up for routine testing of Vitamin D, at least 2-3 times per year to avoid over-replacement.  - Vitamin D, Ergocalciferol, (DRISDOL) 1.25 MG (50000 UNIT) CAPS capsule; Take 1 capsule (50,000 Units total) by mouth  every 7 (seven) days.  Dispense: 4 capsule; Refill: 2  4. SOBOE (shortness of breath on exertion) IC shows a significant decrease in her RMR, which is stopping her weight loss. Rogelio must meet her calorie and protein goals as well as regular strengthening exercise to improve.   5. Obesity, Current BMI 30.5 Erika Garrett is currently in the action stage of change. As such, her goal is to continue with weight loss efforts. She has agreed to change to keeping a food journal and adhering to recommended goals of 1300 calories and 90+ grams of protein daily.   Provided protein content of food handout.  We discussed really trying to increase protein.  Provided vegetarian/pescatarian diet.  Exercise goals: As is.   Behavioral modification strategies: increasing lean protein intake, decreasing simple carbohydrates, meal planning and cooking strategies, travel eating strategies, and keeping a strict food journal.  Richard has agreed to follow-up with our clinic in 12 weeks. She was informed of the importance of frequent follow-up visits to maximize her success with intensive lifestyle modifications for her multiple health conditions.   Objective:   Blood pressure 108/72, pulse 63, temperature 98.1 F (36.7 C), height '5\' 7"'$  (1.702 m), weight 194 lb (88 kg), last menstrual period 11/27/2019, SpO2 100 %. Body mass index is 30.38 kg/m.  General: Cooperative, alert, well developed, in no acute distress. HEENT: Conjunctivae and lids unremarkable. Cardiovascular: Regular rhythm.  Lungs: Normal work of breathing. Neurologic: No focal deficits.  Lab Results  Component Value Date   CREATININE 0.71 10/12/2021   BUN 18 10/12/2021   NA 138 10/12/2021   K 4.5 10/12/2021   CL 103 10/12/2021   CO2 27 10/12/2021   Lab Results  Component Value Date   ALT 52 (H) 10/12/2021   AST 32 10/12/2021   ALKPHOS 84 10/12/2021   BILITOT 0.6 10/12/2021   Lab Results  Component Value Date   HGBA1C 5.1 08/10/2021   HGBA1C  5.2 10/10/2020   HGBA1C 5.2 03/17/2020   Lab Results  Component Value Date   INSULIN 6.8 08/10/2021   INSULIN 10.6 10/10/2020   INSULIN 6.4 03/17/2020   Lab Results  Component Value Date   TSH 2.640 08/10/2021   Lab Results  Component Value Date   CHOL 224 (H) 10/12/2021   HDL 40.90 10/12/2021   LDLCALC 121 (H) 08/10/2021   LDLDIRECT 159.0 10/12/2021   TRIG 241.0 (H) 10/12/2021   CHOLHDL 5 10/12/2021   Lab Results  Component Value Date   VD25OH 44.7 08/10/2021   VD25OH 43.6 02/27/2021   VD25OH 29.76 (L) 09/23/2020   Lab Results  Component Value Date   WBC 9.7 10/12/2021   HGB 15.2 (H) 10/12/2021   HCT 44.1 10/12/2021   MCV 92.3 10/12/2021   PLT 230.0 10/12/2021   No results found for: "IRON", "TIBC", "FERRITIN"  Attestation Statements:   Reviewed by clinician on day of visit: allergies, medications, problem list, medical history, surgical history, family history, social history, and previous encounter notes.  Time spent on visit including pre-visit chart review and post-visit care and charting was 40 minutes.   I, Trixie Dredge, am acting as transcriptionist for Dennard Nip, MD.  I have reviewed the above documentation for accuracy and completeness, and I agree with the above. -  Dennard Nip, MD

## 2021-10-27 ENCOUNTER — Telehealth: Payer: Self-pay | Admitting: Family Medicine

## 2021-10-27 NOTE — Telephone Encounter (Signed)
Left VM for patient. If she calls back please have her speak with a nurse/CMA and inform that her neck xray is normal. Will continue with current plan.   If any questions then please take the best time and phone number to call and I will try to call her back.   Rosemarie Ax, MD [facility] 10/27/2021, 11:57 AM

## 2021-11-27 ENCOUNTER — Other Ambulatory Visit: Payer: BC Managed Care – PPO

## 2021-12-04 ENCOUNTER — Encounter: Payer: Self-pay | Admitting: Family Medicine

## 2021-12-04 ENCOUNTER — Ambulatory Visit: Payer: BC Managed Care – PPO | Admitting: Family Medicine

## 2021-12-04 VITALS — BP 122/64 | Ht 66.0 in | Wt 200.0 lb

## 2021-12-04 DIAGNOSIS — M899 Disorder of bone, unspecified: Secondary | ICD-10-CM | POA: Insufficient documentation

## 2021-12-04 DIAGNOSIS — M5412 Radiculopathy, cervical region: Secondary | ICD-10-CM | POA: Diagnosis not present

## 2021-12-04 NOTE — Assessment & Plan Note (Signed)
The previous radicular pain does seem to be improved.  Her symptoms today seem more periscapular in nature. -Counseled on home exercise therapy and supportive care. -Referral to physical therapy. -Could consider further imaging.

## 2021-12-04 NOTE — Progress Notes (Signed)
  Erika Garrett - 41 y.o. female MRN [id number]  Date of birth: June 25, 1980  SUBJECTIVE:  Including CC & ROS.  No chief complaint on file.   Erika Garrett is a 41 y.o. female that is following up for her cervical radiculopathy and presenting with periscapular pain.   Review of Systems See HPI   HISTORY: Past Medical, Surgical, Social, and Family History Reviewed & Updated per EMR.   Pertinent Historical Findings include:  Past Medical History:  Diagnosis Date   Allergy    Anxiety    Back pain    Bilateral swelling of feet    Constipation    Depression    Elevated liver enzymes    Endometriosis    Hyperlipidemia    hx of, no medications   Hypertension 2016   Intercranial Hypertension   Intracranial hypertension    Joint pain    Other fatigue    Pseudotumor cerebri induced by OCPs 05/22/2017   Shortness of breath on exertion    Subclinical hypothyroidism    Ventral hernia    Vitamin D deficiency     Past Surgical History:  Procedure Laterality Date   ABLATION ON ENDOMETRIOSIS     ROBOTIC ASSISTED LAPAROSCOPIC PARTIAL CYSTECTOMY       PHYSICAL EXAM:  VS: BP 122/64   Ht '5\' 6"'$  (1.676 m)   Wt 200 lb (90.7 kg)   LMP 11/27/2019   BMI 32.28 kg/m  Physical Exam Gen: NAD, alert, cooperative with exam, well-appearing MSK:  Neurovascularly intact      ASSESSMENT & PLAN:   Cervical radiculopathy The previous radicular pain does seem to be improved.  Her symptoms today seem more periscapular in nature. -Counseled on home exercise therapy and supportive care. -Referral to physical therapy. -Could consider further imaging.  Scapular dysfunction Acutely occurring.  Symptoms seem more consistent with a periscapular type pain -Counseled on home exercise therapy and supportive care. -Referral to physical therapy. -Pursue shockwave therapy. -Could can consider trigger point injection.

## 2021-12-04 NOTE — Assessment & Plan Note (Signed)
Acutely occurring.  Symptoms seem more consistent with a periscapular type pain -Counseled on home exercise therapy and supportive care. -Referral to physical therapy. -Pursue shockwave therapy. -Could can consider trigger point injection.

## 2021-12-04 NOTE — Patient Instructions (Signed)
Good to see you Please continue heat  Please try the exercises  We have made a referral for physical therapy   Please send me a message in MyChart with any questions or updates.  Please see me back to start shockwave therapy .   --Dr. Raeford Razor

## 2021-12-08 ENCOUNTER — Other Ambulatory Visit: Payer: Self-pay | Admitting: Family Medicine

## 2021-12-08 ENCOUNTER — Encounter: Payer: Self-pay | Admitting: Family Medicine

## 2021-12-08 ENCOUNTER — Other Ambulatory Visit (INDEPENDENT_AMBULATORY_CARE_PROVIDER_SITE_OTHER): Payer: BC Managed Care – PPO

## 2021-12-08 DIAGNOSIS — R7989 Other specified abnormal findings of blood chemistry: Secondary | ICD-10-CM | POA: Diagnosis not present

## 2021-12-08 DIAGNOSIS — D729 Disorder of white blood cells, unspecified: Secondary | ICD-10-CM

## 2021-12-08 DIAGNOSIS — E782 Mixed hyperlipidemia: Secondary | ICD-10-CM | POA: Diagnosis not present

## 2021-12-08 LAB — HEPATIC FUNCTION PANEL
ALT: 30 U/L (ref 0–35)
AST: 19 U/L (ref 0–37)
Albumin: 4.1 g/dL (ref 3.5–5.2)
Alkaline Phosphatase: 73 U/L (ref 39–117)
Bilirubin, Direct: 0.1 mg/dL (ref 0.0–0.3)
Total Bilirubin: 0.5 mg/dL (ref 0.2–1.2)
Total Protein: 6 g/dL (ref 6.0–8.3)

## 2021-12-08 LAB — LIPID PANEL
Cholesterol: 183 mg/dL (ref 0–200)
HDL: 37.5 mg/dL — ABNORMAL LOW (ref >39.00)
LDL Cholesterol: 105 mg/dL — ABNORMAL HIGH (ref 0–99)
NonHDL: 145.07
Total CHOL/HDL Ratio: 5
Triglycerides: 198 mg/dL — ABNORMAL HIGH (ref 0.0–149.0)
VLDL: 39.6 mg/dL (ref 0.0–40.0)

## 2021-12-08 LAB — CBC WITH DIFFERENTIAL/PLATELET
Basophils Absolute: 0.1 10*3/uL (ref 0.0–0.1)
Basophils Relative: 0.4 % (ref 0.0–3.0)
Eosinophils Absolute: 0.2 10*3/uL (ref 0.0–0.7)
Eosinophils Relative: 1.2 % (ref 0.0–5.0)
HCT: 43.2 % (ref 36.0–46.0)
Hemoglobin: 14.5 g/dL (ref 12.0–15.0)
Lymphocytes Relative: 25.2 % (ref 12.0–46.0)
Lymphs Abs: 4 10*3/uL (ref 0.7–4.0)
MCHC: 33.6 g/dL (ref 30.0–36.0)
MCV: 93 fl (ref 78.0–100.0)
Monocytes Absolute: 0.9 10*3/uL (ref 0.1–1.0)
Monocytes Relative: 5.7 % (ref 3.0–12.0)
Neutro Abs: 10.8 10*3/uL — ABNORMAL HIGH (ref 1.4–7.7)
Neutrophils Relative %: 67.5 % (ref 43.0–77.0)
Platelets: 236 10*3/uL (ref 150.0–400.0)
RBC: 4.65 Mil/uL (ref 3.87–5.11)
RDW: 12.9 % (ref 11.5–15.5)
WBC: 15.9 10*3/uL — ABNORMAL HIGH (ref 4.0–10.5)

## 2021-12-11 ENCOUNTER — Ambulatory Visit: Payer: Self-pay | Admitting: Family Medicine

## 2021-12-11 VITALS — Ht 67.0 in | Wt 190.0 lb

## 2021-12-11 DIAGNOSIS — M899 Disorder of bone, unspecified: Secondary | ICD-10-CM

## 2021-12-11 NOTE — Assessment & Plan Note (Signed)
Completed shockwave therapy  

## 2021-12-11 NOTE — Progress Notes (Signed)
  Erika Garrett - 41 y.o. female MRN [id number]  Date of birth: [birth year]  SUBJECTIVE:  Including CC & ROS.  No chief complaint on file.   Erika Garrett is a 41 y.o. female that is  here for shockwave therapy.    Review of Systems See HPI   HISTORY: Past Medical, Surgical, Social, and Family History Reviewed & Updated per EMR.   Pertinent Historical Findings include:  Past Medical History:  Diagnosis Date   Allergy    Anxiety    Back pain    Bilateral swelling of feet    Constipation    Depression    Elevated liver enzymes    Endometriosis    Hyperlipidemia    hx of, no medications   Hypertension 2016   Intercranial Hypertension   Intracranial hypertension    Joint pain    Other fatigue    Pseudotumor cerebri induced by OCPs 05/22/2017   Shortness of breath on exertion    Subclinical hypothyroidism    Ventral hernia    Vitamin D deficiency     Past Surgical History:  Procedure Laterality Date   ABLATION ON ENDOMETRIOSIS     ROBOTIC ASSISTED LAPAROSCOPIC PARTIAL CYSTECTOMY       PHYSICAL EXAM:  VS: Ht '5\' 7"'$  (1.702 m)   Wt 190 lb (86.2 kg)   LMP 11/27/2019   BMI 29.76 kg/m  Physical Exam Gen: NAD, alert, cooperative with exam, well-appearing MSK:  Neurovascularly intact    ECSWT Note Erika Garrett 03-18-1980  Procedure: ECSWT Indications: left periscapular pain  Procedure Details Consent: Risks of procedure as well as the alternatives and risks of each were explained to the (patient/caregiver).  Consent for procedure obtained. Time Out: Verified patient identification, verified procedure, site/side was marked, verified correct patient position, special equipment/implants available, medications/allergies/relevent history reviewed, required imaging and test results available.  Performed.  The area was cleaned with iodine and alcohol swabs.    The left flank was targeted for Extracorporeal shockwave therapy.   Preset: muscle injury Power Level:  90 Frequency: 10 Impulse/cycles: 4500 Head size: medium  Session: 1  Patient did tolerate procedure well.    ASSESSMENT & PLAN:   Scapular dysfunction Completed shockwave therapy

## 2021-12-12 DIAGNOSIS — M542 Cervicalgia: Secondary | ICD-10-CM | POA: Diagnosis not present

## 2021-12-12 DIAGNOSIS — M25519 Pain in unspecified shoulder: Secondary | ICD-10-CM | POA: Diagnosis not present

## 2021-12-18 ENCOUNTER — Ambulatory Visit: Payer: BC Managed Care – PPO | Admitting: Family Medicine

## 2021-12-20 DIAGNOSIS — M542 Cervicalgia: Secondary | ICD-10-CM | POA: Diagnosis not present

## 2021-12-20 DIAGNOSIS — M25519 Pain in unspecified shoulder: Secondary | ICD-10-CM | POA: Diagnosis not present

## 2021-12-22 DIAGNOSIS — M25519 Pain in unspecified shoulder: Secondary | ICD-10-CM | POA: Diagnosis not present

## 2021-12-22 DIAGNOSIS — M542 Cervicalgia: Secondary | ICD-10-CM | POA: Diagnosis not present

## 2021-12-25 ENCOUNTER — Encounter: Payer: Self-pay | Admitting: Family Medicine

## 2021-12-25 ENCOUNTER — Ambulatory Visit: Payer: Self-pay | Admitting: Family Medicine

## 2021-12-25 VITALS — Ht 67.0 in | Wt 190.0 lb

## 2021-12-25 DIAGNOSIS — M899 Disorder of bone, unspecified: Secondary | ICD-10-CM

## 2021-12-25 NOTE — Assessment & Plan Note (Signed)
Completed shockwave therapy  

## 2021-12-25 NOTE — Progress Notes (Signed)
  Erika Garrett - 41 y.o. female MRN [id number]  Date of birth: June 24, 1980  SUBJECTIVE:  Including CC & ROS.  No chief complaint on file.   Erika Garrett is a 41 y.o. female that is  here for shockwave therapy.    Review of Systems See HPI   HISTORY: Past Medical, Surgical, Social, and Family History Reviewed & Updated per EMR.   Pertinent Historical Findings include:  Past Medical History:  Diagnosis Date   Allergy    Anxiety    Back pain    Bilateral swelling of feet    Constipation    Depression    Elevated liver enzymes    Endometriosis    Hyperlipidemia    hx of, no medications   Hypertension 2016   Intercranial Hypertension   Intracranial hypertension    Joint pain    Other fatigue    Pseudotumor cerebri induced by OCPs 05/22/2017   Shortness of breath on exertion    Subclinical hypothyroidism    Ventral hernia    Vitamin D deficiency     Past Surgical History:  Procedure Laterality Date   ABLATION ON ENDOMETRIOSIS     ROBOTIC ASSISTED LAPAROSCOPIC PARTIAL CYSTECTOMY       PHYSICAL EXAM:  VS: Ht '5\' 7"'$  (1.702 m)   Wt 190 lb (86.2 kg)   LMP 11/27/2019   BMI 29.76 kg/m  Physical Exam Gen: NAD, alert, cooperative with exam, well-appearing MSK:  Neurovascularly intact    ECSWT Note Erika Garrett 06/19/1980  Procedure: ECSWT Indications: right scapular pain  Procedure Details Consent: Risks of procedure as well as the alternatives and risks of each were explained to the (patient/caregiver).  Consent for procedure obtained. Time Out: Verified patient identification, verified procedure, site/side was marked, verified correct patient position, special equipment/implants available, medications/allergies/relevent history reviewed, required imaging and test results available.  Performed.  The area was cleaned with iodine and alcohol swabs.    The right periscapular region was targeted for Extracorporeal shockwave therapy.   Preset: muscle injury Power Level:  100 Frequency: 10 Impulse/cycles: 5000 Head size: medium  Session: 2  Patient did tolerate procedure well.    ASSESSMENT & PLAN:   Scapular dysfunction Completed shockwave therapy.

## 2021-12-27 DIAGNOSIS — M542 Cervicalgia: Secondary | ICD-10-CM | POA: Diagnosis not present

## 2021-12-27 DIAGNOSIS — M25519 Pain in unspecified shoulder: Secondary | ICD-10-CM | POA: Diagnosis not present

## 2021-12-29 DIAGNOSIS — M25519 Pain in unspecified shoulder: Secondary | ICD-10-CM | POA: Diagnosis not present

## 2021-12-29 DIAGNOSIS — M542 Cervicalgia: Secondary | ICD-10-CM | POA: Diagnosis not present

## 2022-01-01 ENCOUNTER — Ambulatory Visit (INDEPENDENT_AMBULATORY_CARE_PROVIDER_SITE_OTHER): Payer: BC Managed Care – PPO | Admitting: Family Medicine

## 2022-01-01 ENCOUNTER — Encounter: Payer: Self-pay | Admitting: Family Medicine

## 2022-01-01 ENCOUNTER — Ambulatory Visit: Payer: Self-pay | Admitting: Family Medicine

## 2022-01-01 ENCOUNTER — Encounter (INDEPENDENT_AMBULATORY_CARE_PROVIDER_SITE_OTHER): Payer: Self-pay | Admitting: Family Medicine

## 2022-01-01 VITALS — Ht 67.0 in | Wt 199.0 lb

## 2022-01-01 VITALS — BP 105/68 | HR 68 | Temp 97.4°F | Ht 67.0 in | Wt 199.0 lb

## 2022-01-01 DIAGNOSIS — E88819 Insulin resistance, unspecified: Secondary | ICD-10-CM

## 2022-01-01 DIAGNOSIS — E559 Vitamin D deficiency, unspecified: Secondary | ICD-10-CM

## 2022-01-01 DIAGNOSIS — Z5320 Procedure and treatment not carried out because of patient's decision for unspecified reasons: Secondary | ICD-10-CM | POA: Insufficient documentation

## 2022-01-01 DIAGNOSIS — R0602 Shortness of breath: Secondary | ICD-10-CM

## 2022-01-01 DIAGNOSIS — E669 Obesity, unspecified: Secondary | ICD-10-CM

## 2022-01-01 DIAGNOSIS — E782 Mixed hyperlipidemia: Secondary | ICD-10-CM | POA: Diagnosis not present

## 2022-01-01 DIAGNOSIS — M899 Disorder of bone, unspecified: Secondary | ICD-10-CM

## 2022-01-01 DIAGNOSIS — Z6831 Body mass index (BMI) 31.0-31.9, adult: Secondary | ICD-10-CM

## 2022-01-01 MED ORDER — VITAMIN D (ERGOCALCIFEROL) 1.25 MG (50000 UNIT) PO CAPS: 50000.0000 [IU] | ORAL_CAPSULE | ORAL | 0 refills | Status: DC

## 2022-01-01 NOTE — Progress Notes (Signed)
  Danique Hartsough - 41 y.o. female MRN [id number]  Date of birth: 01/11/1981  SUBJECTIVE:  Including CC & ROS.  No chief complaint on file.   Maxene Byington is a 41 y.o. female that is here for shockwave therapy.    Review of Systems See HPI   HISTORY: Past Medical, Surgical, Social, and Family History Reviewed & Updated per EMR.   Pertinent Historical Findings include:  Past Medical History:  Diagnosis Date   Allergy    Anxiety    Back pain    Bilateral swelling of feet    Constipation    Depression    Elevated liver enzymes    Endometriosis    Hyperlipidemia    hx of, no medications   Hypertension 2016   Intercranial Hypertension   Intracranial hypertension    Joint pain    Other fatigue    Pseudotumor cerebri induced by OCPs 05/22/2017   Shortness of breath on exertion    Subclinical hypothyroidism    Ventral hernia    Vitamin D deficiency     Past Surgical History:  Procedure Laterality Date   ABLATION ON ENDOMETRIOSIS     ROBOTIC ASSISTED LAPAROSCOPIC PARTIAL CYSTECTOMY       PHYSICAL EXAM:  VS: Ht '5\' 7"'$  (1.702 m)   Wt 199 lb (90.3 kg)   LMP 11/27/2019   BMI 31.17 kg/m  Physical Exam Gen: NAD, alert, cooperative with exam, well-appearing MSK:  Neurovascularly intact    ECSWT Note Hser Belanger 1980/06/30  Procedure: ECSWT Indications: scapular dysfunction  Procedure Details Consent: Risks of procedure as well as the alternatives and risks of each were explained to the (patient/caregiver).  Consent for procedure obtained. Time Out: Verified patient identification, verified procedure, site/side was marked, verified correct patient position, special equipment/implants available, medications/allergies/relevent history reviewed, required imaging and test results available.  Performed.  The area was cleaned with iodine and alcohol swabs.    The right periscapular area was targeted for Extracorporeal shockwave therapy.   Preset: muscle injury Power Level:  110 Frequency: 8 Impulse/cycles: 5000 Head size: medium  Session: 3  Patient did tolerate procedure well.    ASSESSMENT & PLAN:   Scapular dysfunction Completed shockwave therapy

## 2022-01-01 NOTE — Assessment & Plan Note (Signed)
Completed shockwave therapy  

## 2022-01-03 DIAGNOSIS — M542 Cervicalgia: Secondary | ICD-10-CM | POA: Diagnosis not present

## 2022-01-03 DIAGNOSIS — M25519 Pain in unspecified shoulder: Secondary | ICD-10-CM | POA: Diagnosis not present

## 2022-01-05 DIAGNOSIS — M542 Cervicalgia: Secondary | ICD-10-CM | POA: Diagnosis not present

## 2022-01-05 DIAGNOSIS — M25519 Pain in unspecified shoulder: Secondary | ICD-10-CM | POA: Diagnosis not present

## 2022-01-11 NOTE — Addendum Note (Signed)
Addended by: Kelle Darting A on: 01/11/2022 01:29 PM   Modules accepted: Orders

## 2022-01-17 NOTE — Progress Notes (Signed)
Chief Complaint:   OBESITY Erika Garrett is here to discuss her progress with her obesity treatment plan along with follow-up of her obesity related diagnoses. Erika Garrett is on keeping a food journal and adhering to recommended goals of 1300 calories and 90+ grams of protein daily and states she is following her eating plan approximately 0% of the time. Erika Garrett states she is doing cardio for 30-45 minutes 7 times per week.  Today's visit was #: 18 Starting weight: 200 lbs Starting date: 03/17/2020 Today's weight: 199 lbs Today's date: 01/01/2022 Total lbs lost to date: 1 Total lbs lost since last in-office visit: 0  Interim History: Erika Garrett has been struggling with weight gain despite being mindful of her choices, and she is working on increasing her steps to 7,500 daily.  She is alternating with using the elliptical and treadmill.  Subjective:   1. SOBOE (shortness of breath on exertion) Erika Garrett's symptoms are not worsening.  She continues to struggle with weight loss, and her repeat IC is due today.  2. Mixed hyperlipidemia Erika Garrett is working on her diet.  Her PCP wanted to start a statin but she has researched this and she has declined to not take statins.  She has increased her fiber in her diet.  3. Vitamin D deficiency Erika Garrett's last vitamin D level was not at goal.  She is on vitamin D prescription weekly.  4. Insulin resistance Erika Garrett is working on her diet and exercise, and her last A1c and fasting glucose had improved.  She is struggling with weight gain and she is due to have labs repeated.  5. Statin medication declined by patient  Assessment/Plan:   1. SOBOE (shortness of breath on exertion) VO2 and RMR have both improved and are appropriate for her size and weight. Erika Garrett's RMR is now 6389.  She will continue to work on her diet and exercise.  2. Mixed hyperlipidemia Erika Garrett declined to take statins, and she will continue to try to control with her diet.  3. Vitamin D deficiency Erika Garrett will  continue prescription vitamin D 50,000 IU every week, and we will refill for 90 days.  - Vitamin D, Ergocalciferol, (DRISDOL) 1.25 MG (50000 UNIT) CAPS capsule; Take 1 capsule (50,000 Units total) by mouth every 7 (seven) days.  Dispense: 12 capsule; Refill: 0  4. Insulin resistance Erika Garrett will continue to work on weight loss, exercise, and decreasing simple carbohydrates to help decrease the risk of diabetes. Erika Garrett agreed to follow-up with [name] as directed to closely monitor her progress.  5. Statin medication declined by patient  6. Obesity, Current BMI 31.3 Erika Garrett is currently in the action stage of change. As such, her goal is to continue with weight loss efforts. She has agreed to keeping a food journal and adhering to recommended goals of 1200-1300 calories and 80+ grams of protein daily.   Exercise goals: As is.   Behavioral modification strategies: increasing lean protein intake, decreasing simple carbohydrates, meal planning and cooking strategies, and keeping a strict food journal.  Erika Garrett has agreed to follow-up with our clinic in 12 weeks. She was informed of the importance of frequent follow-up visits to maximize her success with intensive lifestyle modifications for her multiple health conditions.   Objective:   Blood pressure 105/68, pulse 68, temperature (!) 97.4 F (36.3 C), height '5\' 7"'$  (1.702 m), weight 199 lb (90.3 kg), last menstrual period 11/27/2019, SpO2 98 %. Body mass index is 31.17 kg/m.  General: Cooperative, alert, well developed, in no acute  distress. HEENT: Conjunctivae and lids unremarkable. Cardiovascular: Regular rhythm.  Lungs: Normal work of breathing. Neurologic: No focal deficits.   Lab Results  Component Value Date   CREATININE 0.71 10/12/2021   BUN 18 10/12/2021   NA 138 10/12/2021   K 4.5 10/12/2021   CL 103 10/12/2021   CO2 27 10/12/2021   Lab Results  Component Value Date   ALT 30 12/08/2021   AST 19 12/08/2021   ALKPHOS 73 12/08/2021    BILITOT 0.5 12/08/2021   Lab Results  Component Value Date   HGBA1C 5.1 08/10/2021   HGBA1C 5.2 10/10/2020   HGBA1C 5.2 03/17/2020   Lab Results  Component Value Date   INSULIN 6.8 08/10/2021   INSULIN 10.6 10/10/2020   INSULIN 6.4 03/17/2020   Lab Results  Component Value Date   TSH 2.640 08/10/2021   Lab Results  Component Value Date   CHOL 183 12/08/2021   HDL 37.50 (L) 12/08/2021   LDLCALC 105 (H) 12/08/2021   LDLDIRECT 159.0 10/12/2021   TRIG 198.0 (H) 12/08/2021   CHOLHDL 5 12/08/2021   Lab Results  Component Value Date   VD25OH 44.7 08/10/2021   VD25OH 43.6 02/27/2021   VD25OH 29.76 (L) 09/23/2020   Lab Results  Component Value Date   WBC 15.9 (H) 12/08/2021   HGB 14.5 12/08/2021   HCT 43.2 12/08/2021   MCV 93.0 12/08/2021   PLT 236.0 12/08/2021   No results found for: "IRON", "TIBC", "FERRITIN"  Attestation Statements:   Reviewed by clinician on day of visit: allergies, medications, problem list, medical history, surgical history, family history, social history, and previous encounter notes.  Time spent on visit including pre-visit chart review and post-visit care and charting was 40 minutes.   I, Trixie Dredge, am acting as transcriptionist for Dennard Nip, MD.  I have reviewed the above documentation for accuracy and completeness, and I agree with the above. -  Dennard Nip, MD

## 2022-01-18 ENCOUNTER — Ambulatory Visit: Payer: BC Managed Care – PPO | Admitting: Family Medicine

## 2022-01-19 ENCOUNTER — Other Ambulatory Visit: Payer: BC Managed Care – PPO

## 2022-01-23 ENCOUNTER — Ambulatory Visit: Payer: Self-pay | Admitting: Family Medicine

## 2022-01-23 ENCOUNTER — Encounter: Payer: Self-pay | Admitting: Family Medicine

## 2022-01-23 VITALS — Ht 67.0 in | Wt 199.0 lb

## 2022-01-23 DIAGNOSIS — S161XXA Strain of muscle, fascia and tendon at neck level, initial encounter: Secondary | ICD-10-CM | POA: Insufficient documentation

## 2022-01-23 NOTE — Assessment & Plan Note (Signed)
Acutely occurring right sided pain. History of TMJ.  - shockwave therapy today

## 2022-01-23 NOTE — Progress Notes (Signed)
  Erika Garrett - 42 y.o. female MRN [id number]  Date of birth: [birth year]  SUBJECTIVE:  Including CC & ROS.  No chief complaint on file.   Erika Garrett is a 41 y.o. female that is here for shockwave therapy.    Review of Systems See HPI   HISTORY: Past Medical, Surgical, Social, and Family History Reviewed & Updated per EMR.   Pertinent Historical Findings include:  Past Medical History:  Diagnosis Date   Allergy    Anxiety    Back pain    Bilateral swelling of feet    Constipation    Depression    Elevated liver enzymes    Endometriosis    Hyperlipidemia    hx of, no medications   Hypertension 2016   Intercranial Hypertension   Intracranial hypertension    Joint pain    Other fatigue    Pseudotumor cerebri induced by OCPs 05/22/2017   Shortness of breath on exertion    Subclinical hypothyroidism    Ventral hernia    Vitamin D deficiency     Past Surgical History:  Procedure Laterality Date   ABLATION ON ENDOMETRIOSIS     ROBOTIC ASSISTED LAPAROSCOPIC PARTIAL CYSTECTOMY       PHYSICAL EXAM:  VS: Ht '5\' 7"'$  (1.702 m)   Wt 199 lb (90.3 kg)   LMP 11/27/2019   BMI 31.17 kg/m  Physical Exam Gen: NAD, alert, cooperative with exam, well-appearing MSK:  Neurovascularly intact    ECSWT Note Mearl Harewood October 13, 1980  Procedure: ECSWT Indications: right neck pain  Procedure Details Consent: Risks of procedure as well as the alternatives and risks of each were explained to the (patient/caregiver).  Consent for procedure obtained. Time Out: Verified patient identification, verified procedure, site/side was marked, verified correct patient position, special equipment/implants available, medications/allergies/relevent history reviewed, required imaging and test results available.  Performed.  The area was cleaned with iodine and alcohol swabs.    The right trapezius  was targeted for Extracorporeal shockwave therapy.   Preset: muscle injury Power Level: 90 Frequency:  8 Impulse/cycles: 4500 Head size: small  Session: 4. First on this area  Patient did tolerate procedure well.    ASSESSMENT & PLAN:   Cervical strain Acutely occurring right sided pain. History of TMJ.  - shockwave therapy today

## 2022-01-24 DIAGNOSIS — M542 Cervicalgia: Secondary | ICD-10-CM | POA: Diagnosis not present

## 2022-01-24 DIAGNOSIS — M25519 Pain in unspecified shoulder: Secondary | ICD-10-CM | POA: Diagnosis not present

## 2022-01-31 DIAGNOSIS — M542 Cervicalgia: Secondary | ICD-10-CM | POA: Diagnosis not present

## 2022-01-31 DIAGNOSIS — M25519 Pain in unspecified shoulder: Secondary | ICD-10-CM | POA: Diagnosis not present

## 2022-02-06 ENCOUNTER — Ambulatory Visit: Payer: Self-pay | Admitting: Family Medicine

## 2022-02-06 ENCOUNTER — Encounter: Payer: Self-pay | Admitting: Family Medicine

## 2022-02-06 VITALS — Ht 67.0 in | Wt 199.0 lb

## 2022-02-06 DIAGNOSIS — S0300XA Dislocation of jaw, unspecified side, initial encounter: Secondary | ICD-10-CM | POA: Insufficient documentation

## 2022-02-06 DIAGNOSIS — S161XXD Strain of muscle, fascia and tendon at neck level, subsequent encounter: Secondary | ICD-10-CM

## 2022-02-06 NOTE — Progress Notes (Signed)
  Erika Garrett - 42 y.o. female MRN [id number]  Date of birth: [birth year]  SUBJECTIVE:  Including CC & ROS.  No chief complaint on file.   Erika Garrett is a 42 y.o. female that is  here for shockwave therapy.    Review of Systems See HPI   HISTORY: Past Medical, Surgical, Social, and Family History Reviewed & Updated per EMR.   Pertinent Historical Findings include:  Past Medical History:  Diagnosis Date   Allergy    Anxiety    Back pain    Bilateral swelling of feet    Constipation    Depression    Elevated liver enzymes    Endometriosis    Hyperlipidemia    hx of, no medications   Hypertension 2016   Intercranial Hypertension   Intracranial hypertension    Joint pain    Other fatigue    Pseudotumor cerebri induced by OCPs 05/22/2017   Shortness of breath on exertion    Subclinical hypothyroidism    Ventral hernia    Vitamin D deficiency     Past Surgical History:  Procedure Laterality Date   ABLATION ON ENDOMETRIOSIS     ROBOTIC ASSISTED LAPAROSCOPIC PARTIAL CYSTECTOMY       PHYSICAL EXAM:  VS: Ht '5\' 7"'$  (1.702 m)   Wt 199 lb (90.3 kg)   LMP 11/27/2019   BMI 31.17 kg/m  Physical Exam Gen: NAD, alert, cooperative with exam, well-appearing MSK:  Neurovascularly intact    ECSWT Note Erika Garrett Sep 13, 1980  Procedure: ECSWT Indications: Bilateral trapezius pain  Procedure Details Consent: Risks of procedure as well as the alternatives and risks of each were explained to the (patient/caregiver).  Consent for procedure obtained. Time Out: Verified patient identification, verified procedure, site/side was marked, verified correct patient position, special equipment/implants available, medications/allergies/relevent history reviewed, required imaging and test results available.  Performed.  The area was cleaned with iodine and alcohol swabs.    The right and left trapezius was targeted for Extracorporeal shockwave therapy.   Preset: Muscle injury Power  Level: 80 Frequency: 10 Impulse/cycles: 5000 Head size: Large Session: 5  Patient did tolerate procedure well.    ASSESSMENT & PLAN:   Cervical strain Completed shockwave therapy  TMJ (dislocation of temporomandibular joint), initial encounter Acutely occurring.  Ongoing for the past few weeks.  She did see a cyst in the sinus observed by the dentist.  Pain is mostly right-sided in nature. -Referral to TMJ specialist

## 2022-02-06 NOTE — Assessment & Plan Note (Signed)
Acutely occurring.  Ongoing for the past few weeks.  She did see a cyst in the sinus observed by the dentist.  Pain is mostly right-sided in nature. -Referral to TMJ specialist

## 2022-02-06 NOTE — Assessment & Plan Note (Signed)
Completed shockwave therapy  

## 2022-02-07 DIAGNOSIS — M25519 Pain in unspecified shoulder: Secondary | ICD-10-CM | POA: Diagnosis not present

## 2022-02-07 DIAGNOSIS — M542 Cervicalgia: Secondary | ICD-10-CM | POA: Diagnosis not present

## 2022-02-12 ENCOUNTER — Other Ambulatory Visit: Payer: BC Managed Care – PPO

## 2022-02-13 ENCOUNTER — Ambulatory Visit: Payer: 59 | Admitting: Family Medicine

## 2022-02-13 ENCOUNTER — Other Ambulatory Visit (INDEPENDENT_AMBULATORY_CARE_PROVIDER_SITE_OTHER): Payer: 59

## 2022-02-13 DIAGNOSIS — D729 Disorder of white blood cells, unspecified: Secondary | ICD-10-CM

## 2022-02-13 DIAGNOSIS — R7989 Other specified abnormal findings of blood chemistry: Secondary | ICD-10-CM

## 2022-02-13 DIAGNOSIS — E782 Mixed hyperlipidemia: Secondary | ICD-10-CM | POA: Diagnosis not present

## 2022-02-13 LAB — LIPID PANEL
Cholesterol: 174 mg/dL (ref 0–200)
HDL: 34.9 mg/dL — ABNORMAL LOW (ref >39.00)
LDL Cholesterol: 109 mg/dL — ABNORMAL HIGH (ref 0–99)
NonHDL: 138.9
Total CHOL/HDL Ratio: 5
Triglycerides: 148 mg/dL (ref 0.0–149.0)
VLDL: 29.6 mg/dL (ref 0.0–40.0)

## 2022-02-14 ENCOUNTER — Encounter: Payer: Self-pay | Admitting: Family Medicine

## 2022-02-14 LAB — CBC WITH DIFFERENTIAL/PLATELET
Absolute Monocytes: 632 cells/uL (ref 200–950)
Basophils Absolute: 31 cells/uL (ref 0–200)
Basophils Relative: 0.3 %
Eosinophils Absolute: 153 cells/uL (ref 15–500)
Eosinophils Relative: 1.5 %
HCT: 42.6 % (ref 35.0–45.0)
Hemoglobin: 14.8 g/dL (ref 11.7–15.5)
Lymphs Abs: 3274 cells/uL (ref 850–3900)
MCH: 31.9 pg (ref 27.0–33.0)
MCHC: 34.7 g/dL (ref 32.0–36.0)
MCV: 91.8 fL (ref 80.0–100.0)
MPV: 11.2 fL (ref 7.5–12.5)
Monocytes Relative: 6.2 %
Neutro Abs: 6110 cells/uL (ref 1500–7800)
Neutrophils Relative %: 59.9 %
Platelets: 222 10*3/uL (ref 140–400)
RBC: 4.64 10*6/uL (ref 3.80–5.10)
RDW: 11.9 % (ref 11.0–15.0)
Total Lymphocyte: 32.1 %
WBC: 10.2 10*3/uL (ref 3.8–10.8)

## 2022-02-14 LAB — PATHOLOGIST SMEAR REVIEW

## 2022-02-26 ENCOUNTER — Ambulatory Visit: Payer: Self-pay | Admitting: Family Medicine

## 2022-02-26 ENCOUNTER — Encounter: Payer: Self-pay | Admitting: Family Medicine

## 2022-02-26 VITALS — Ht 67.0 in | Wt 199.0 lb

## 2022-02-26 DIAGNOSIS — M899 Disorder of bone, unspecified: Secondary | ICD-10-CM

## 2022-02-26 NOTE — Progress Notes (Signed)
  Erika Garrett - 42 y.o. female MRN [id number]  Date of birth: September 03, 1980  SUBJECTIVE:  Including CC & ROS.  No chief complaint on file.   Erika Garrett is a 42 y.o. female that is  here for shockwave therapy.    Review of Systems See HPI   HISTORY: Past Medical, Surgical, Social, and Family History Reviewed & Updated per EMR.   Pertinent Historical Findings include:  Past Medical History:  Diagnosis Date   Allergy    Anxiety    Back pain    Bilateral swelling of feet    Constipation    Depression    Elevated liver enzymes    Endometriosis    Hyperlipidemia    hx of, no medications   Hypertension 2016   Intercranial Hypertension   Intracranial hypertension    Joint pain    Other fatigue    Pseudotumor cerebri induced by OCPs 05/22/2017   Shortness of breath on exertion    Subclinical hypothyroidism    Ventral hernia    Vitamin D deficiency     Past Surgical History:  Procedure Laterality Date   ABLATION ON ENDOMETRIOSIS     ROBOTIC ASSISTED LAPAROSCOPIC PARTIAL CYSTECTOMY       PHYSICAL EXAM:  VS: Ht '5\' 7"'$  (1.702 m)   Wt 199 lb (90.3 kg)   LMP 11/27/2019   BMI 31.17 kg/m  Physical Exam Gen: NAD, alert, cooperative with exam, well-appearing MSK:  Neurovascularly intact    ECSWT Note Erika Garrett 01-24-1980  Procedure: ECSWT Indications: Thoracic back pain  Procedure Details Consent: Risks of procedure as well as the alternatives and risks of each were explained to the (patient/caregiver).  Consent for procedure obtained. Time Out: Verified patient identification, verified procedure, site/side was marked, verified correct patient position, special equipment/implants available, medications/allergies/relevent history reviewed, required imaging and test results available.  Performed.  The area was cleaned with iodine and alcohol swabs.    The thoracic back was targeted for Extracorporeal shockwave therapy.   Preset: myofascial pain  Power Level:  80 Frequency: 10 Impulse/cycles: 5000 Head size: large  Session: 6  Patient did tolerate procedure well.    ASSESSMENT & PLAN:   Scapular dysfunction Completed shockwave therapy

## 2022-02-26 NOTE — Assessment & Plan Note (Signed)
Completed shockwave therapy  

## 2022-02-27 DIAGNOSIS — N809 Endometriosis, unspecified: Secondary | ICD-10-CM | POA: Diagnosis not present

## 2022-02-27 DIAGNOSIS — Z01419 Encounter for gynecological examination (general) (routine) without abnormal findings: Secondary | ICD-10-CM | POA: Diagnosis not present

## 2022-03-02 DIAGNOSIS — M25519 Pain in unspecified shoulder: Secondary | ICD-10-CM | POA: Diagnosis not present

## 2022-03-02 DIAGNOSIS — M542 Cervicalgia: Secondary | ICD-10-CM | POA: Diagnosis not present

## 2022-03-06 ENCOUNTER — Ambulatory Visit: Payer: 59 | Admitting: Family Medicine

## 2022-03-09 DIAGNOSIS — M542 Cervicalgia: Secondary | ICD-10-CM | POA: Diagnosis not present

## 2022-03-09 DIAGNOSIS — M25519 Pain in unspecified shoulder: Secondary | ICD-10-CM | POA: Diagnosis not present

## 2022-03-14 DIAGNOSIS — M542 Cervicalgia: Secondary | ICD-10-CM | POA: Diagnosis not present

## 2022-03-14 DIAGNOSIS — M25519 Pain in unspecified shoulder: Secondary | ICD-10-CM | POA: Diagnosis not present

## 2022-03-16 DIAGNOSIS — M25519 Pain in unspecified shoulder: Secondary | ICD-10-CM | POA: Diagnosis not present

## 2022-03-16 DIAGNOSIS — M542 Cervicalgia: Secondary | ICD-10-CM | POA: Diagnosis not present

## 2022-03-19 DIAGNOSIS — Z1231 Encounter for screening mammogram for malignant neoplasm of breast: Secondary | ICD-10-CM | POA: Diagnosis not present

## 2022-03-19 DIAGNOSIS — R92323 Mammographic fibroglandular density, bilateral breasts: Secondary | ICD-10-CM | POA: Diagnosis not present

## 2022-03-19 LAB — HM MAMMOGRAPHY

## 2022-03-20 ENCOUNTER — Ambulatory Visit: Payer: 59 | Admitting: Family Medicine

## 2022-03-22 ENCOUNTER — Encounter: Payer: Self-pay | Admitting: Family Medicine

## 2022-03-22 ENCOUNTER — Ambulatory Visit: Payer: Self-pay | Admitting: Family Medicine

## 2022-03-22 VITALS — Ht 67.0 in | Wt 199.0 lb

## 2022-03-22 DIAGNOSIS — M899 Disorder of bone, unspecified: Secondary | ICD-10-CM

## 2022-03-22 NOTE — Assessment & Plan Note (Signed)
Completed shockwave therapy  

## 2022-03-22 NOTE — Progress Notes (Signed)
  Erika Garrett - 42 y.o. female MRN [id number]  Date of birth: [birth year]  SUBJECTIVE:  Including CC & ROS.  No chief complaint on file.   Erika Garrett is a 42 y.o. female that is  here for shockwave therapy.    Review of Systems See HPI   HISTORY: Past Medical, Surgical, Social, and Family History Reviewed & Updated per EMR.   Pertinent Historical Findings include:  Past Medical History:  Diagnosis Date   Allergy    Anxiety    Back pain    Bilateral swelling of feet    Constipation    Depression    Elevated liver enzymes    Endometriosis    Hyperlipidemia    hx of, no medications   Hypertension 2016   Intercranial Hypertension   Intracranial hypertension    Joint pain    Other fatigue    Pseudotumor cerebri induced by OCPs 05/22/2017   Shortness of breath on exertion    Subclinical hypothyroidism    Ventral hernia    Vitamin D deficiency     Past Surgical History:  Procedure Laterality Date   ABLATION ON ENDOMETRIOSIS     ROBOTIC ASSISTED LAPAROSCOPIC PARTIAL CYSTECTOMY       PHYSICAL EXAM:  VS: Ht 5' 7"$  (1.702 m)   Wt 199 lb (90.3 kg)   LMP 11/27/2019   BMI 31.17 kg/m  Physical Exam Gen: NAD, alert, cooperative with exam, well-appearing MSK:  Neurovascularly intact    ECSWT Note Erika Garrett May 30, 1980  Procedure: ECSWT Indications: thoracic back pain  Procedure Details Consent: Risks of procedure as well as the alternatives and risks of each were explained to the (patient/caregiver).  Consent for procedure obtained. Time Out: Verified patient identification, verified procedure, site/side was marked, verified correct patient position, special equipment/implants available, medications/allergies/relevent history reviewed, required imaging and test results available.  Performed.  The area was cleaned with iodine and alcohol swabs.    The thoracic back was targeted for Extracorporeal shockwave therapy.   Preset: muscle injury Power Level: 90 Frequency:  9 Impulse/cycles: 5000 Head size: medium  Session: 7  Patient did tolerate procedure well.    ASSESSMENT & PLAN:   Scapular dysfunction Completed shockwave therapy

## 2022-03-25 ENCOUNTER — Other Ambulatory Visit (INDEPENDENT_AMBULATORY_CARE_PROVIDER_SITE_OTHER): Payer: Self-pay | Admitting: Family Medicine

## 2022-03-25 DIAGNOSIS — E559 Vitamin D deficiency, unspecified: Secondary | ICD-10-CM

## 2022-03-26 ENCOUNTER — Ambulatory Visit (INDEPENDENT_AMBULATORY_CARE_PROVIDER_SITE_OTHER): Payer: BC Managed Care – PPO | Admitting: Family Medicine

## 2022-03-27 DIAGNOSIS — M25519 Pain in unspecified shoulder: Secondary | ICD-10-CM | POA: Diagnosis not present

## 2022-03-27 DIAGNOSIS — M542 Cervicalgia: Secondary | ICD-10-CM | POA: Diagnosis not present

## 2022-03-29 DIAGNOSIS — M542 Cervicalgia: Secondary | ICD-10-CM | POA: Diagnosis not present

## 2022-03-29 DIAGNOSIS — M25519 Pain in unspecified shoulder: Secondary | ICD-10-CM | POA: Diagnosis not present

## 2022-04-02 ENCOUNTER — Encounter (INDEPENDENT_AMBULATORY_CARE_PROVIDER_SITE_OTHER): Payer: Self-pay | Admitting: Family Medicine

## 2022-04-03 ENCOUNTER — Ambulatory Visit: Payer: 59 | Admitting: Family Medicine

## 2022-04-10 ENCOUNTER — Ambulatory Visit (INDEPENDENT_AMBULATORY_CARE_PROVIDER_SITE_OTHER): Payer: BC Managed Care – PPO | Admitting: Family Medicine

## 2022-04-12 DIAGNOSIS — M542 Cervicalgia: Secondary | ICD-10-CM | POA: Diagnosis not present

## 2022-04-12 DIAGNOSIS — M25519 Pain in unspecified shoulder: Secondary | ICD-10-CM | POA: Diagnosis not present

## 2022-04-12 NOTE — Telephone Encounter (Signed)
Are you going to see her husband as a new patient?

## 2022-04-12 NOTE — Telephone Encounter (Signed)
I literally do not have any new patient openings. He needs to establish with one of the other physicians for the 1st 3 visits nad then can switch over if he would like.

## 2022-04-16 DIAGNOSIS — M542 Cervicalgia: Secondary | ICD-10-CM | POA: Diagnosis not present

## 2022-04-16 DIAGNOSIS — M25519 Pain in unspecified shoulder: Secondary | ICD-10-CM | POA: Diagnosis not present

## 2022-05-01 ENCOUNTER — Ambulatory Visit (INDEPENDENT_AMBULATORY_CARE_PROVIDER_SITE_OTHER): Payer: 59 | Admitting: Family Medicine

## 2022-05-01 ENCOUNTER — Encounter (INDEPENDENT_AMBULATORY_CARE_PROVIDER_SITE_OTHER): Payer: Self-pay | Admitting: Family Medicine

## 2022-05-01 VITALS — BP 132/86 | HR 86 | Temp 97.6°F | Ht 67.0 in | Wt 204.0 lb

## 2022-05-01 DIAGNOSIS — E559 Vitamin D deficiency, unspecified: Secondary | ICD-10-CM

## 2022-05-01 DIAGNOSIS — Z6831 Body mass index (BMI) 31.0-31.9, adult: Secondary | ICD-10-CM | POA: Insufficient documentation

## 2022-05-01 DIAGNOSIS — Z6832 Body mass index (BMI) 32.0-32.9, adult: Secondary | ICD-10-CM | POA: Diagnosis not present

## 2022-05-01 DIAGNOSIS — S0300XD Dislocation of jaw, unspecified side, subsequent encounter: Secondary | ICD-10-CM

## 2022-05-01 DIAGNOSIS — E669 Obesity, unspecified: Secondary | ICD-10-CM | POA: Diagnosis not present

## 2022-05-01 DIAGNOSIS — S0300XA Dislocation of jaw, unspecified side, initial encounter: Secondary | ICD-10-CM | POA: Insufficient documentation

## 2022-05-01 DIAGNOSIS — Z6833 Body mass index (BMI) 33.0-33.9, adult: Secondary | ICD-10-CM | POA: Insufficient documentation

## 2022-05-02 NOTE — Progress Notes (Signed)
Chief Complaint:   OBESITY Erika Garrett is here to discuss her progress with her obesity treatment plan along with follow-up of her obesity related diagnoses. Erika Garrett is on keeping a food journal and adhering to recommended goals of 1200-1300 calories and 80+ grams of protein and states she is following her eating plan approximately 75% of the time. Erika Garrett states she is doing 0 minutes 0 times per week.  Today's visit was #: 19 Starting weight: 200 lbs Starting date: 03/17/2020 Today's weight: 204 lbs Today's date: 05/01/2022 Total lbs lost to date: 0 Total lbs lost since last in-office visit: 0  Interim History: Erika Garrett is dealing with a lot of stress. She is unable to exercise regularly. She doesn't want to go to the gym in the morning. She is frustrated with how difficult weight loss can be, but she is working on getting back on track with her eating plan and exercise.   Subjective:   1. Dislocation of temporomandibular joint, subsequent encounter Erika Garrett has right jaw pain, worse in the last few months. She uses a mouth guard, but she still has pain. She is open to discussing other options. She notes headache with tmj.   2. Vitamin D deficiency Erika Garrett is on Vitamin D supplementation. Her last level was not yet at goal.   Assessment/Plan:   1. Dislocation of temporomandibular joint, subsequent encounter Erika Garrett was referred to Dr. Lucia Gaskins to discuss possibility of Botox injections to her masseter muscles.   2. Vitamin D deficiency Erika Garrett is to continue her Vitamin D and we will recheck labs at her next visit.   3. BMI 32.0-32.9,adult  4. Obesity, Beginning BMI 31.32 Erika Garrett is currently in the action stage of change. As such, her goal is to continue with weight loss efforts. She has agreed to keeping a food journal and adhering to recommended goals of 1200-1300 calories and 80+ grams of protein daily.   Exercise goals: Various exercises were discussed to meet her goals of 20-30 minutes 5 times per week.    Behavioral modification strategies: increasing lean protein intake.  Erika Garrett has agreed to follow-up with our clinic in 4 weeks. She was informed of the importance of frequent follow-up visits to maximize her success with intensive lifestyle modifications for her multiple health conditions.   Objective:   Blood pressure 132/86, pulse 86, temperature 97.6 F (36.4 C), height 5\' 7"  (1.702 m), weight 204 lb (92.5 kg), last menstrual period 11/27/2019, SpO2 98 %. Body mass index is 31.95 kg/m.  Lab Results  Component Value Date   CREATININE 0.71 10/12/2021   BUN 18 10/12/2021   NA 138 10/12/2021   K 4.5 10/12/2021   CL 103 10/12/2021   CO2 27 10/12/2021   Lab Results  Component Value Date   ALT 30 12/08/2021   AST 19 12/08/2021   ALKPHOS 73 12/08/2021   BILITOT 0.5 12/08/2021   Lab Results  Component Value Date   HGBA1C 5.1 08/10/2021   HGBA1C 5.2 10/10/2020   HGBA1C 5.2 03/17/2020   Lab Results  Component Value Date   INSULIN 6.8 08/10/2021   INSULIN 10.6 10/10/2020   INSULIN 6.4 03/17/2020   Lab Results  Component Value Date   TSH 2.640 08/10/2021   Lab Results  Component Value Date   CHOL 174 02/13/2022   HDL 34.90 (L) 02/13/2022   LDLCALC 109 (H) 02/13/2022   LDLDIRECT 159.0 10/12/2021   TRIG 148.0 02/13/2022   CHOLHDL 5 02/13/2022   Lab Results  Component Value  Date   VD25OH 44.7 08/10/2021   VD25OH 43.6 02/27/2021   VD25OH 29.76 (L) 09/23/2020   Lab Results  Component Value Date   WBC 10.2 02/13/2022   HGB 14.8 02/13/2022   HCT 42.6 02/13/2022   MCV 91.8 02/13/2022   PLT 222 02/13/2022   No results found for: "IRON", "TIBC", "FERRITIN"  Attestation Statements:   Reviewed by clinician on day of visit: allergies, medications, problem list, medical history, surgical history, family history, social history, and previous encounter notes.   I, Burt Knack, am acting as transcriptionist for Quillian Quince, MD.  I have reviewed the above  documentation for accuracy and completeness, and I agree with the above. -  Quillian Quince, MD

## 2022-05-07 ENCOUNTER — Encounter: Payer: Self-pay | Admitting: *Deleted

## 2022-05-23 DIAGNOSIS — M25519 Pain in unspecified shoulder: Secondary | ICD-10-CM | POA: Diagnosis not present

## 2022-05-23 DIAGNOSIS — M542 Cervicalgia: Secondary | ICD-10-CM | POA: Diagnosis not present

## 2022-06-13 DIAGNOSIS — M542 Cervicalgia: Secondary | ICD-10-CM | POA: Diagnosis not present

## 2022-06-13 DIAGNOSIS — M25519 Pain in unspecified shoulder: Secondary | ICD-10-CM | POA: Diagnosis not present

## 2022-06-21 DIAGNOSIS — M542 Cervicalgia: Secondary | ICD-10-CM | POA: Diagnosis not present

## 2022-06-21 DIAGNOSIS — M25519 Pain in unspecified shoulder: Secondary | ICD-10-CM | POA: Diagnosis not present

## 2022-06-29 DIAGNOSIS — M542 Cervicalgia: Secondary | ICD-10-CM | POA: Diagnosis not present

## 2022-06-29 DIAGNOSIS — M25519 Pain in unspecified shoulder: Secondary | ICD-10-CM | POA: Diagnosis not present

## 2022-07-06 DIAGNOSIS — M542 Cervicalgia: Secondary | ICD-10-CM | POA: Diagnosis not present

## 2022-07-06 DIAGNOSIS — M25519 Pain in unspecified shoulder: Secondary | ICD-10-CM | POA: Diagnosis not present

## 2022-07-09 DIAGNOSIS — M542 Cervicalgia: Secondary | ICD-10-CM | POA: Diagnosis not present

## 2022-07-09 DIAGNOSIS — M25519 Pain in unspecified shoulder: Secondary | ICD-10-CM | POA: Diagnosis not present

## 2022-07-18 DIAGNOSIS — M25519 Pain in unspecified shoulder: Secondary | ICD-10-CM | POA: Diagnosis not present

## 2022-07-18 DIAGNOSIS — M542 Cervicalgia: Secondary | ICD-10-CM | POA: Diagnosis not present

## 2022-07-23 DIAGNOSIS — M25519 Pain in unspecified shoulder: Secondary | ICD-10-CM | POA: Diagnosis not present

## 2022-07-23 DIAGNOSIS — M542 Cervicalgia: Secondary | ICD-10-CM | POA: Diagnosis not present

## 2022-07-30 DIAGNOSIS — M542 Cervicalgia: Secondary | ICD-10-CM | POA: Diagnosis not present

## 2022-07-30 DIAGNOSIS — M25519 Pain in unspecified shoulder: Secondary | ICD-10-CM | POA: Diagnosis not present

## 2022-07-31 ENCOUNTER — Encounter (INDEPENDENT_AMBULATORY_CARE_PROVIDER_SITE_OTHER): Payer: Self-pay | Admitting: Family Medicine

## 2022-07-31 ENCOUNTER — Ambulatory Visit (INDEPENDENT_AMBULATORY_CARE_PROVIDER_SITE_OTHER): Payer: 59 | Admitting: Family Medicine

## 2022-07-31 VITALS — BP 109/75 | HR 69 | Temp 97.7°F | Ht 67.0 in | Wt 201.0 lb

## 2022-07-31 DIAGNOSIS — E669 Obesity, unspecified: Secondary | ICD-10-CM | POA: Diagnosis not present

## 2022-07-31 DIAGNOSIS — E559 Vitamin D deficiency, unspecified: Secondary | ICD-10-CM | POA: Diagnosis not present

## 2022-07-31 DIAGNOSIS — E88819 Insulin resistance, unspecified: Secondary | ICD-10-CM | POA: Diagnosis not present

## 2022-07-31 DIAGNOSIS — Z6831 Body mass index (BMI) 31.0-31.9, adult: Secondary | ICD-10-CM

## 2022-07-31 DIAGNOSIS — E782 Mixed hyperlipidemia: Secondary | ICD-10-CM | POA: Diagnosis not present

## 2022-07-31 MED ORDER — VITAMIN D (ERGOCALCIFEROL) 1.25 MG (50000 UNIT) PO CAPS: 50000.0000 [IU] | ORAL_CAPSULE | ORAL | 0 refills | Status: DC

## 2022-07-31 NOTE — Progress Notes (Signed)
Chief Complaint:   OBESITY Erika Garrett is here to discuss her progress with her obesity treatment plan along with follow-up of her obesity related diagnoses. Erika Garrett is on the Category 2 Plan and states she is following her eating plan approximately 80% of the time. Erika Garrett states she is walking 2-3 miles 7 times per week.    Today's visit was #: 20 Starting weight: 200 lbs Starting date: 03/17/2020 Today's weight: 201 lbs Today's date: 07/31/2022 Total lbs lost to date: 0 Total lbs lost since last in-office visit: 3  Interim History: Patient continues to do well with her weight loss. She is walking more even in the heat, but she feels she tolerates the heat well and she works on hydration. She is following the Category 2 plan closely.   Subjective:   1. Insulin resistance Patient is doing well with her diet and exercise, and she is due for labs.   2. Vitamin D deficiency Patient is on Vitamin D prescription, and she is due for labs. She has questions about possible menopause and bone density.   3. Mixed hyperlipidemia Patient is on statin, and she is following a low cholesterol diet.   Assessment/Plan:   1. Insulin resistance We will check labs today, and we will follow-up at her next visit. Patient will continue with her diet and exercise.   - CMP14+EGFR - Vitamin B12 - Insulin, random - Hemoglobin A1c  2. Vitamin D deficiency We will check labs today, and we will follow-up at her next visit. We will refill prescription Vitamin D for 90 days. Patient's questions were answered.   - Vitamin D, Ergocalciferol, (DRISDOL) 1.25 MG (50000 UNIT) CAPS capsule; Take 1 capsule (50,000 Units total) by mouth every 7 (seven) days.  Dispense: 12 capsule; Refill: 0 - VITAMIN D 25 Hydroxy (Vit-D Deficiency, Fractures)  3. Mixed hyperlipidemia We will check labs today, and we will follow-up at her next visit. Patient will continue with her diet and exercise.   - Lipid Panel With LDL/HDL Ratio -  TSH  4. BMI 31.0-31.9,adult  5. Obesity, Beginning BMI 31.32 Erika Garrett is currently in the action stage of change. As such, her goal is to continue with weight loss efforts. She has agreed to the Category 2 Plan.   Exercise goals: As is.   Behavioral modification strategies: increasing lean protein intake.  Erika Garrett has agreed to follow-up with our clinic in 4 weeks. She was informed of the importance of frequent follow-up visits to maximize her success with intensive lifestyle modifications for her multiple health conditions.   Erika Garrett was informed we would discuss her lab results at her next visit unless there is a critical issue that needs to be addressed sooner. Erika Garrett agreed to keep her next visit at the agreed upon time to discuss these results.  Objective:   Blood pressure 109/75, pulse 69, temperature 97.7 F (36.5 C), height 5\' 7"  (1.702 m), weight 201 lb (91.2 kg), last menstrual period 11/27/2019, SpO2 96 %. Body mass index is 31.48 kg/m.  Lab Results  Component Value Date   CREATININE 0.71 10/12/2021   BUN 18 10/12/2021   NA 138 10/12/2021   K 4.5 10/12/2021   CL 103 10/12/2021   CO2 27 10/12/2021   Lab Results  Component Value Date   ALT 30 12/08/2021   AST 19 12/08/2021   ALKPHOS 73 12/08/2021   BILITOT 0.5 12/08/2021   Lab Results  Component Value Date   HGBA1C 5.1 08/10/2021   HGBA1C  5.2 10/10/2020   HGBA1C 5.2 03/17/2020   Lab Results  Component Value Date   INSULIN 6.8 08/10/2021   INSULIN 10.6 10/10/2020   INSULIN 6.4 03/17/2020   Lab Results  Component Value Date   TSH 2.640 08/10/2021   Lab Results  Component Value Date   CHOL 174 02/13/2022   HDL 34.90 (L) 02/13/2022   LDLCALC 109 (H) 02/13/2022   LDLDIRECT 159.0 10/12/2021   TRIG 148.0 02/13/2022   CHOLHDL 5 02/13/2022   Lab Results  Component Value Date   VD25OH 44.7 08/10/2021   VD25OH 43.6 02/27/2021   VD25OH 29.76 (L) 09/23/2020   Lab Results  Component Value Date   WBC 10.2  02/13/2022   HGB 14.8 02/13/2022   HCT 42.6 02/13/2022   MCV 91.8 02/13/2022   PLT 222 02/13/2022   No results found for: "IRON", "TIBC", "FERRITIN"  Attestation Statements:   Reviewed by clinician on day of visit: allergies, medications, problem list, medical history, surgical history, family history, social history, and previous encounter notes.   I, Burt Knack, am acting as transcriptionist for Quillian Quince, MD.  I have reviewed the above documentation for accuracy and completeness, and I agree with the above. -  Quillian Quince, MD

## 2022-08-01 LAB — VITAMIN B12: Vitamin B-12: 517 pg/mL (ref 232–1245)

## 2022-08-01 LAB — LIPID PANEL WITH LDL/HDL RATIO
Cholesterol, Total: 201 mg/dL — ABNORMAL HIGH (ref 100–199)
HDL: 36 mg/dL — ABNORMAL LOW (ref >39)
LDL Chol Calc (NIH): 126 mg/dL — ABNORMAL HIGH (ref 0–99)
LDL/HDL Ratio: 3.5 ratio — ABNORMAL HIGH (ref 0.0–3.2)
Triglycerides: 220 mg/dL — ABNORMAL HIGH (ref 0–149)
VLDL Cholesterol Cal: 39 mg/dL (ref 5–40)

## 2022-08-01 LAB — CMP14+EGFR
ALT: 35 IU/L — ABNORMAL HIGH (ref 0–32)
AST: 30 IU/L (ref 0–40)
Albumin: 4.5 g/dL (ref 3.9–4.9)
Alkaline Phosphatase: 96 IU/L (ref 44–121)
BUN/Creatinine Ratio: 25 — ABNORMAL HIGH (ref 9–23)
BUN: 16 mg/dL (ref 6–24)
Bilirubin Total: 0.3 mg/dL (ref 0.0–1.2)
CO2: 21 mmol/L (ref 20–29)
Calcium: 9.5 mg/dL (ref 8.7–10.2)
Chloride: 103 mmol/L (ref 96–106)
Creatinine, Ser: 0.63 mg/dL (ref 0.57–1.00)
Globulin, Total: 2 g/dL (ref 1.5–4.5)
Glucose: 95 mg/dL (ref 70–99)
Potassium: 4.4 mmol/L (ref 3.5–5.2)
Sodium: 139 mmol/L (ref 134–144)
Total Protein: 6.5 g/dL (ref 6.0–8.5)
eGFR: 114 mL/min/{1.73_m2} (ref >59)

## 2022-08-01 LAB — INSULIN, RANDOM: INSULIN: 9 u[IU]/mL (ref 2.6–24.9)

## 2022-08-01 LAB — VITAMIN D 25 HYDROXY (VIT D DEFICIENCY, FRACTURES): Vit D, 25-Hydroxy: 31.3 ng/mL (ref 30.0–100.0)

## 2022-08-01 LAB — HEMOGLOBIN A1C
Est. average glucose Bld gHb Est-mCnc: 111 mg/dL
Hgb A1c MFr Bld: 5.5 % (ref 4.8–5.6)

## 2022-08-01 LAB — TSH: TSH: 2.3 u[IU]/mL (ref 0.450–4.500)

## 2022-08-06 DIAGNOSIS — M542 Cervicalgia: Secondary | ICD-10-CM | POA: Diagnosis not present

## 2022-08-06 DIAGNOSIS — M25519 Pain in unspecified shoulder: Secondary | ICD-10-CM | POA: Diagnosis not present

## 2022-08-13 ENCOUNTER — Ambulatory Visit (INDEPENDENT_AMBULATORY_CARE_PROVIDER_SITE_OTHER): Payer: 59 | Admitting: Family Medicine

## 2022-08-13 ENCOUNTER — Encounter (HOSPITAL_BASED_OUTPATIENT_CLINIC_OR_DEPARTMENT_OTHER): Payer: Self-pay | Admitting: Family Medicine

## 2022-08-13 VITALS — BP 133/92 | HR 91 | Ht 67.0 in | Wt 200.0 lb

## 2022-08-13 DIAGNOSIS — M25519 Pain in unspecified shoulder: Secondary | ICD-10-CM | POA: Diagnosis not present

## 2022-08-13 DIAGNOSIS — M5412 Radiculopathy, cervical region: Secondary | ICD-10-CM

## 2022-08-13 DIAGNOSIS — M542 Cervicalgia: Secondary | ICD-10-CM | POA: Diagnosis not present

## 2022-08-13 NOTE — Patient Instructions (Signed)
  Medication Instructions:  Your physician recommends that you continue on your current medications as directed. Please refer to the Current Medication list given to you today. --If you need a refill on any your medications before your next appointment, please call your pharmacy first. If no refills are authorized on file call the office.-- Lab Work: Your physician has recommended that you have lab work today: No If you have labs (blood work) drawn today and your tests are completely normal, you will receive your results via MyChart message OR a phone call from our staff.  Please ensure you check your voicemail in the event that you authorized detailed messages to be left on a delegated number. If you have any lab test that is abnormal or we need to change your treatment, we will call you to review the results.  Referrals/Procedures/Imaging: Yes  Follow-Up: Your next appointment:   Your physician recommends that you schedule a follow-up appointment as needed with Dr. de [place].  You will receive a text message or e-mail with a link to a survey about your care and experience with us today! We would greatly appreciate your feedback!   Thanks for letting us be apart of your health journey!!  Primary Care and Sports Medicine   Dr. Raymond de [place]   We encourage you to activate your patient portal called "MyChart".  Sign up information is provided on this After Visit Summary.  MyChart is used to connect with patients for Virtual Visits (Telemedicine).  Patients are able to view lab/test results, encounter notes, upcoming appointments, etc.  Non-urgent messages can be sent to your provider as well. To learn more about what you can do with MyChart, please visit --  [email or web address].    

## 2022-08-13 NOTE — Progress Notes (Signed)
Procedures performed today:    None.  Independent interpretation of notes and tests performed by another provider:   None.  Brief History, Exam, Impression, and Recommendations:    BP (!) 133/92 (BP Location: Right Arm, Patient Position: Sitting, Cuff Size: Large)   Pulse 91   Ht 5\' 7"  (1.702 m)   Wt 200 lb (90.7 kg)   LMP 11/27/2019   SpO2 100%   BMI 31.32 kg/m   Cervical radiculopathy Assessment & Plan: Patient reports longstanding history of left shoulder/upper extremity pain.  Issues initially began after surgery related to endometriosis.  She reports extensive abdominal surgery related to widespread endometriosis.  Following this, she was placed on a period of significant activity restriction and subsequently had COVID.  Following this prolonged timeframe of inactivity, she noted development of left shoulder pain, extending some towards neck, through thoracic area of left side of back.  She has also had some radiation of symptoms into distal left upper extremity.  She has had prior cervical spine imaging, most recently in October 2023.  Treatment thus far has included massage therapy, physical therapy, ESWT with Dr. Jordan Likes.  All interventions have only provided temporary relief for patient and pain would usually return shortly after.  She has not tried much in the way of medication as soon he is hesitant about potential side effects.  She was prescribed meloxicam by Dr. Jordan Likes, however never took after reading potential side effects.  Given duration of symptoms, patient does have fixation on possible severe and/or life-threatening causes of continued pain.  She is worried that ongoing symptoms may be related to cancer or other serious underlying etiology given lack of response to conservative measures thus far.  She also would like to know what else to do about ongoing pain, however is very hesitant about utilizing medications due to potential listed side effects. She does note that  she has had improvement in regards to pain control as well as range of motion of left upper extremity.  She does feel that she should be further along in recovery however. Patient is left-handed.  She endorses radicular nature of symptoms, occasional numbness/tingling, burning.  Denies specific neck pain at this time. Left shoulder: Obvious swelling, bruising or erythema: absent Deformity of the shoulder: absent Active ROM: full range of motion Passive ROM: full range of motion Strength: normal/normal Empty can: Negative Hawkins: Negative Neer's: Negative Neurovascular exam: intact  Given continued symptoms despite adequate trial of conservative measures including physical therapy, massage therapy, ESWT, home exercises, recommend proceeding with advanced imaging.  Given history and exam, feel that optimal imaging would pertain to cervical spine imaging to assess for possible nerve impingement leading to radicular symptoms, weakness. Within differential is also neurogenic TOS.  If MRI reveals structural abnormality, can proceed with management accordingly.  If MRI is reassuring, consider evaluation with neurology for possible EMG for specific nerve evaluation.  In the meantime, patient can continue with home exercises as tolerated, topical treatments as desired.  We did discuss utilization of oral medications including Tylenol, NSAIDs.  She may utilize as preferred for these medications, does have hesitancy given the potential side effects from these medications.    Return if symptoms worsen or fail to improve. Pending results of MRI  Spent 42 minutes on this patient encounter, including preparation, chart review, face-to-face counseling with patient and coordination of care, and documentation of encounter   ___________________________________________ Gauge Winski de [place], MD, ABFM, CAQSM Primary Care and Sports Medicine [facility]  [employer]

## 2022-08-14 NOTE — Assessment & Plan Note (Signed)
Patient reports longstanding history of left shoulder/upper extremity pain.  Issues initially began after surgery related to endometriosis.  She reports extensive abdominal surgery related to widespread endometriosis.  Following this, she was placed on a period of significant activity restriction and subsequently had COVID.  Following this prolonged timeframe of inactivity, she noted development of left shoulder pain, extending some towards neck, through thoracic area of left side of back.  She has also had some radiation of symptoms into distal left upper extremity.  She has had prior cervical spine imaging, most recently in October 2023.  Treatment thus far has included massage therapy, physical therapy, ESWT with Dr. Jordan Likes.  All interventions have only provided temporary relief for patient and pain would usually return shortly after.  She has not tried much in the way of medication as soon he is hesitant about potential side effects.  She was prescribed meloxicam by Dr. Jordan Likes, however never took after reading potential side effects.  Given duration of symptoms, patient does have fixation on possible severe and/or life-threatening causes of continued pain.  She is worried that ongoing symptoms may be related to cancer or other serious underlying etiology given lack of response to conservative measures thus far.  She also would like to know what else to do about ongoing pain, however is very hesitant about utilizing medications due to potential listed side effects. She does note that she has had improvement in regards to pain control as well as range of motion of left upper extremity.  She does feel that she should be further along in recovery however. Patient is left-handed.  She endorses radicular nature of symptoms, occasional numbness/tingling, burning.  Denies specific neck pain at this time. Left shoulder: Obvious swelling, bruising or erythema: absent Deformity of the shoulder: absent Active ROM:  full range of motion Passive ROM: full range of motion Strength: normal/normal Empty can: Negative Hawkins: Negative Neer's: Negative Neurovascular exam: intact  Given continued symptoms despite adequate trial of conservative measures including physical therapy, massage therapy, ESWT, home exercises, recommend proceeding with advanced imaging.  Given history and exam, feel that optimal imaging would pertain to cervical spine imaging to assess for possible nerve impingement leading to radicular symptoms, weakness. Within differential is also neurogenic TOS.  If MRI reveals structural abnormality, can proceed with management accordingly.  If MRI is reassuring, consider evaluation with neurology for possible EMG for specific nerve evaluation.  In the meantime, patient can continue with home exercises as tolerated, topical treatments as desired.  We did discuss utilization of oral medications including Tylenol, NSAIDs.  She may utilize as preferred for these medications, does have hesitancy given the potential side effects from these medications.

## 2022-08-27 DIAGNOSIS — M542 Cervicalgia: Secondary | ICD-10-CM | POA: Diagnosis not present

## 2022-08-27 DIAGNOSIS — M25519 Pain in unspecified shoulder: Secondary | ICD-10-CM | POA: Diagnosis not present

## 2022-09-06 ENCOUNTER — Encounter (INDEPENDENT_AMBULATORY_CARE_PROVIDER_SITE_OTHER): Payer: Self-pay | Admitting: Family Medicine

## 2022-09-06 ENCOUNTER — Ambulatory Visit (INDEPENDENT_AMBULATORY_CARE_PROVIDER_SITE_OTHER): Payer: 59 | Admitting: Family Medicine

## 2022-09-06 ENCOUNTER — Encounter (INDEPENDENT_AMBULATORY_CARE_PROVIDER_SITE_OTHER): Payer: Self-pay

## 2022-09-06 VITALS — BP 113/77 | HR 68 | Temp 97.5°F | Ht 67.0 in | Wt 201.0 lb

## 2022-09-06 DIAGNOSIS — E669 Obesity, unspecified: Secondary | ICD-10-CM

## 2022-09-06 DIAGNOSIS — Z6831 Body mass index (BMI) 31.0-31.9, adult: Secondary | ICD-10-CM

## 2022-09-06 DIAGNOSIS — E782 Mixed hyperlipidemia: Secondary | ICD-10-CM | POA: Diagnosis not present

## 2022-09-06 DIAGNOSIS — E559 Vitamin D deficiency, unspecified: Secondary | ICD-10-CM

## 2022-09-06 MED ORDER — VITAMIN D (ERGOCALCIFEROL) 1.25 MG (50000 UNIT) PO CAPS: 50000.0000 [IU] | ORAL_CAPSULE | ORAL | 0 refills | Status: DC

## 2022-09-10 DIAGNOSIS — M25519 Pain in unspecified shoulder: Secondary | ICD-10-CM | POA: Diagnosis not present

## 2022-09-10 DIAGNOSIS — M542 Cervicalgia: Secondary | ICD-10-CM | POA: Diagnosis not present

## 2022-09-11 NOTE — Progress Notes (Signed)
Chief Complaint:   OBESITY Erika Garrett is here to discuss her progress with her obesity treatment plan along with follow-up of her obesity related diagnoses. Erika Garrett is on the Category 2 Plan and states she is following her eating plan approximately 90% of the time. Erika Garrett states she is walking and doing physical therapy for 30 minutes 4 times per week.  Today's visit was #: 21 Starting weight: 200 lbs Starting date: 03/17/2020 Today's weight: 201 lbs Today's date: 09/06/2022 Total lbs lost to date: 1 Total lbs lost since last in-office visit: 0  Interim History: Patient has been traveling over the last 2 weeks.  She did very well with avoiding weight gain.  She is doing physical therapy for core strengthening.  Subjective:   1. Vitamin D deficiency Patient is on vitamin D and needs a refill.  Her last vitamin D was low and worsening.  I discussed labs with the patient today.  2. Mixed hyperlipidemia Labs were reviewed, and patient has a lot of questions about various results and strategies to improve without medications.  Assessment/Plan:   1. Vitamin D deficiency Patient will continue once weekly prescription vitamin D, and we will refill for 90 days.  We will recheck labs in 3 months.  - Vitamin D, Ergocalciferol, (DRISDOL) 1.25 MG (50000 UNIT) CAPS capsule; Take 1 capsule (50,000 Units total) by mouth every 7 (seven) days.  Dispense: 12 capsule; Refill: 0  2. Mixed hyperlipidemia Patient declined statin, and all of her questions were answered.  Patient was encouraged to decrease animal fat and increase complex carbohydrates.  She will work on decreasing simple carbohydrates and will continue with her weight loss.  3. BMI 31.0-31.9,adult  4. Obesity, Beginning BMI 31.32 Erika Garrett is currently in the action stage of change. As such, her goal is to continue with weight loss efforts. She has agreed to the Category 2 Plan.   Exercise goals: As is.   Behavioral modification strategies:  increasing lean protein intake and decreasing simple carbohydrates.  Erika Garrett has agreed to follow-up with our clinic in 4 weeks. She was informed of the importance of frequent follow-up visits to maximize her success with intensive lifestyle modifications for her multiple health conditions.   Objective:   Blood pressure 113/77, pulse 68, temperature (!) 97.5 F (36.4 C), height 5\' 7"  (1.702 m), weight 201 lb (91.2 kg), last menstrual period 11/27/2019, SpO2 97%. Body mass index is 31.48 kg/m.  Lab Results  Component Value Date   CREATININE 0.63 07/31/2022   BUN 16 07/31/2022   NA 139 07/31/2022   K 4.4 07/31/2022   CL 103 07/31/2022   CO2 21 07/31/2022   Lab Results  Component Value Date   ALT 35 (H) 07/31/2022   AST 30 07/31/2022   ALKPHOS 96 07/31/2022   BILITOT 0.3 07/31/2022   Lab Results  Component Value Date   HGBA1C 5.5 07/31/2022   HGBA1C 5.1 08/10/2021   HGBA1C 5.2 10/10/2020   HGBA1C 5.2 03/17/2020   Lab Results  Component Value Date   INSULIN 9.0 07/31/2022   INSULIN 6.8 08/10/2021   INSULIN 10.6 10/10/2020   INSULIN 6.4 03/17/2020   Lab Results  Component Value Date   TSH 2.300 07/31/2022   Lab Results  Component Value Date   CHOL 201 (H) 07/31/2022   HDL 36 (L) 07/31/2022   LDLCALC 126 (H) 07/31/2022   LDLDIRECT 159.0 10/12/2021   TRIG 220 (H) 07/31/2022   CHOLHDL 5 02/13/2022   Lab Results  Component Value Date   VD25OH 31.3 07/31/2022   VD25OH 44.7 08/10/2021   VD25OH 43.6 02/27/2021   Lab Results  Component Value Date   WBC 10.2 02/13/2022   HGB 14.8 02/13/2022   HCT 42.6 02/13/2022   MCV 91.8 02/13/2022   PLT 222 02/13/2022   No results found for: "IRON", "TIBC", "FERRITIN"  Attestation Statements:   Reviewed by clinician on day of visit: allergies, medications, problem list, medical history, surgical history, family history, social history, and previous encounter notes.  Time spent on visit including pre-visit chart review and  post-visit care and charting was 42 minutes.   I, Burt Knack, am acting as transcriptionist for Quillian Quince, MD.  I have reviewed the above documentation for accuracy and completeness, and I agree with the above. -  Quillian Quince, MD

## 2022-09-19 DIAGNOSIS — M25519 Pain in unspecified shoulder: Secondary | ICD-10-CM | POA: Diagnosis not present

## 2022-09-19 DIAGNOSIS — M542 Cervicalgia: Secondary | ICD-10-CM | POA: Diagnosis not present

## 2022-09-26 DIAGNOSIS — M25519 Pain in unspecified shoulder: Secondary | ICD-10-CM | POA: Diagnosis not present

## 2022-09-26 DIAGNOSIS — M542 Cervicalgia: Secondary | ICD-10-CM | POA: Diagnosis not present

## 2022-10-03 DIAGNOSIS — M25519 Pain in unspecified shoulder: Secondary | ICD-10-CM | POA: Diagnosis not present

## 2022-10-03 DIAGNOSIS — M542 Cervicalgia: Secondary | ICD-10-CM | POA: Diagnosis not present

## 2022-10-16 ENCOUNTER — Encounter: Payer: BC Managed Care – PPO | Admitting: Family Medicine

## 2022-10-23 ENCOUNTER — Ambulatory Visit (INDEPENDENT_AMBULATORY_CARE_PROVIDER_SITE_OTHER): Payer: 59 | Admitting: Family Medicine

## 2022-11-06 ENCOUNTER — Ambulatory Visit (INDEPENDENT_AMBULATORY_CARE_PROVIDER_SITE_OTHER): Payer: 59 | Admitting: Family Medicine

## 2022-11-06 ENCOUNTER — Encounter (INDEPENDENT_AMBULATORY_CARE_PROVIDER_SITE_OTHER): Payer: Self-pay | Admitting: Family Medicine

## 2022-11-06 VITALS — BP 121/84 | HR 83 | Temp 98.1°F | Ht 67.0 in | Wt 199.0 lb

## 2022-11-06 DIAGNOSIS — E782 Mixed hyperlipidemia: Secondary | ICD-10-CM

## 2022-11-06 DIAGNOSIS — E559 Vitamin D deficiency, unspecified: Secondary | ICD-10-CM

## 2022-11-06 DIAGNOSIS — E669 Obesity, unspecified: Secondary | ICD-10-CM | POA: Diagnosis not present

## 2022-11-06 DIAGNOSIS — Z6831 Body mass index (BMI) 31.0-31.9, adult: Secondary | ICD-10-CM | POA: Diagnosis not present

## 2022-11-06 DIAGNOSIS — K5909 Other constipation: Secondary | ICD-10-CM | POA: Diagnosis not present

## 2022-11-06 MED ORDER — VITAMIN D (ERGOCALCIFEROL) 1.25 MG (50000 UNIT) PO CAPS: 50000.0000 [IU] | ORAL_CAPSULE | ORAL | 0 refills | Status: DC

## 2022-11-06 NOTE — Progress Notes (Signed)
Chief Complaint:   OBESITY Erika Garrett is here to discuss her progress with her obesity treatment plan along with follow-up of her obesity related diagnoses. Erika Garrett is on the Category 2 Plan and states she is following her eating plan approximately 80% of the time. Erika Garrett states she is using the pure bar and walking for 45 minutes 3 times per week.  Today's visit was #: 22 Starting weight: 200 lbs Starting date: 03/17/2020 Today's weight: 199 lbs Today's date: 11/06/2022 Total lbs lost to date: 1 Total lbs lost since last in-office visit: 2  Interim History: Patient continues to do well with her weight loss.  She has been working on her diet and exercise, and travel has decreased significantly so this should make meal planning and prepping easier.  Subjective:   1. Other constipation Patient notes increased constipation before her hysterectomy.  She is on fiber supplements and has been increasing her water intake, and she is now having regular daily BMs.  2. Mixed hyperlipidemia Patient noted low triglycerides when she was eating more beans.  She has tried to decrease fatty meats and decrease simple carbohydrates, and we will be increasing her exercise soon.  She has questions about all of her numbers, and course of increasing levels as well as nonmedication treatments.  3. Vitamin D deficiency Patient is on vitamin D prescription with no side effects noted, and she requests a refill.  Assessment/Plan:   1. Other constipation Patient is to continue to increase her water and fiber intake, and we will continue to monitor.  2. Mixed hyperlipidemia Patient will continue working on decreasing simple carbohydrates and fatty meats.  She may have a genetic component.  All of the patient's questions were answered in depth today.  3. Vitamin D deficiency Patient will continue prescription Vitamin D, and we will refill for 90 days.   - Vitamin D, Ergocalciferol, (DRISDOL) 1.25 MG (50000 UNIT)  CAPS capsule; Take 1 capsule (50,000 Units total) by mouth every 7 (seven) days.  Dispense: 12 capsule; Refill: 0  4. BMI 31.0-31.9,adult  5. Obesity, Beginning BMI 31.32 Erika Garrett is currently in the action stage of change. As such, her goal is to continue with weight loss efforts. She has agreed to the Category 2 Plan.   Exercise goals: As is.   Behavioral modification strategies: decreasing simple carbohydrates and meal planning and cooking strategies.  Erika Garrett has agreed to follow-up with our clinic in 4 weeks. She was informed of the importance of frequent follow-up visits to maximize her success with intensive lifestyle modifications for her multiple health conditions.   Objective:   Blood pressure 121/84, pulse 83, temperature 98.1 F (36.7 C), height 5\' 7"  (1.702 m), weight 199 lb (90.3 kg), last menstrual period 11/27/2019, SpO2 99%. Body mass index is 31.17 kg/m.  Lab Results  Component Value Date   CREATININE 0.63 07/31/2022   BUN 16 07/31/2022   NA 139 07/31/2022   K 4.4 07/31/2022   CL 103 07/31/2022   CO2 21 07/31/2022   Lab Results  Component Value Date   ALT 35 (H) 07/31/2022   AST 30 07/31/2022   ALKPHOS 96 07/31/2022   BILITOT 0.3 07/31/2022   Lab Results  Component Value Date   HGBA1C 5.5 07/31/2022   HGBA1C 5.1 08/10/2021   HGBA1C 5.2 10/10/2020   HGBA1C 5.2 03/17/2020   Lab Results  Component Value Date   INSULIN 9.0 07/31/2022   INSULIN 6.8 08/10/2021   INSULIN 10.6 10/10/2020  INSULIN 6.4 03/17/2020   Lab Results  Component Value Date   TSH 2.300 07/31/2022   Lab Results  Component Value Date   CHOL 201 (H) 07/31/2022   HDL 36 (L) 07/31/2022   LDLCALC 126 (H) 07/31/2022   LDLDIRECT 159.0 10/12/2021   TRIG 220 (H) 07/31/2022   CHOLHDL 5 02/13/2022   Lab Results  Component Value Date   VD25OH 31.3 07/31/2022   VD25OH 44.7 08/10/2021   VD25OH 43.6 02/27/2021   Lab Results  Component Value Date   WBC 10.2 02/13/2022   HGB 14.8  02/13/2022   HCT 42.6 02/13/2022   MCV 91.8 02/13/2022   PLT 222 02/13/2022   No results found for: "IRON", "TIBC", "FERRITIN"  Attestation Statements:   Reviewed by clinician on day of visit: allergies, medications, problem list, medical history, surgical history, family history, social history, and previous encounter notes.  Time spent on visit including pre-visit chart review and post-visit care and charting was 40 minutes.   I, Burt Knack, am acting as transcriptionist for Quillian Quince, MD.  I have reviewed the above documentation for accuracy and completeness, and I agree with the above. -  Quillian Quince, MD

## 2022-11-14 ENCOUNTER — Ambulatory Visit (INDEPENDENT_AMBULATORY_CARE_PROVIDER_SITE_OTHER): Payer: 59 | Admitting: Family Medicine

## 2022-11-14 ENCOUNTER — Encounter: Payer: Self-pay | Admitting: Family Medicine

## 2022-11-14 VITALS — BP 110/72 | HR 61 | Temp 98.7°F | Ht 66.0 in | Wt 204.2 lb

## 2022-11-14 DIAGNOSIS — Z23 Encounter for immunization: Secondary | ICD-10-CM

## 2022-11-14 DIAGNOSIS — L989 Disorder of the skin and subcutaneous tissue, unspecified: Secondary | ICD-10-CM | POA: Diagnosis not present

## 2022-11-14 DIAGNOSIS — Z1283 Encounter for screening for malignant neoplasm of skin: Secondary | ICD-10-CM

## 2022-11-14 DIAGNOSIS — Z0000 Encounter for general adult medical examination without abnormal findings: Secondary | ICD-10-CM | POA: Diagnosis not present

## 2022-11-14 LAB — CBC
HCT: 44.7 % (ref 36.0–46.0)
Hemoglobin: 14.9 g/dL (ref 12.0–15.0)
MCHC: 33.2 g/dL (ref 30.0–36.0)
MCV: 93.9 fL (ref 78.0–100.0)
Platelets: 242 10*3/uL (ref 150.0–400.0)
RBC: 4.76 Mil/uL (ref 3.87–5.11)
RDW: 12.7 % (ref 11.5–15.5)
WBC: 9.3 10*3/uL (ref 4.0–10.5)

## 2022-11-14 LAB — COMPREHENSIVE METABOLIC PANEL
ALT: 32 U/L (ref 0–35)
AST: 23 U/L (ref 0–37)
Albumin: 4.5 g/dL (ref 3.5–5.2)
Alkaline Phosphatase: 86 U/L (ref 39–117)
BUN: 16 mg/dL (ref 6–23)
CO2: 25 meq/L (ref 19–32)
Calcium: 9.4 mg/dL (ref 8.4–10.5)
Chloride: 105 meq/L (ref 96–112)
Creatinine, Ser: 0.63 mg/dL (ref 0.40–1.20)
GFR: 108.9 mL/min (ref >60.00)
Glucose, Bld: 97 mg/dL (ref 70–99)
Potassium: 4.3 meq/L (ref 3.5–5.1)
Sodium: 137 meq/L (ref 135–145)
Total Bilirubin: 0.7 mg/dL (ref 0.2–1.2)
Total Protein: 6.6 g/dL (ref 6.0–8.3)

## 2022-11-14 LAB — LIPID PANEL
Cholesterol: 195 mg/dL (ref 0–200)
HDL: 35.3 mg/dL — ABNORMAL LOW (ref >39.00)
LDL Cholesterol: 127 mg/dL — ABNORMAL HIGH (ref 0–99)
NonHDL: 160.1
Total CHOL/HDL Ratio: 6
Triglycerides: 168 mg/dL — ABNORMAL HIGH (ref 0.0–149.0)
VLDL: 33.6 mg/dL (ref 0.0–40.0)

## 2022-11-14 NOTE — Progress Notes (Signed)
Chief Complaint  Patient presents with   Annual Exam    Discuss with patient a flu shot     Well Woman Erika Garrett is here for a complete physical.   Her last physical was >1 year ago.  Current diet: in general, a "healthy" diet. Current exercise: barre. Weight is stable and she denies fatigue out of ordinary. Seatbelt? Yes Advanced directive? No  Health Maintenance Pap/HPV- N/A- has had hysterectomy Mammogram- Yes Tetanus- Yes Hep C screening- Yes HIV screening- Yes  Past Medical History:  Diagnosis Date   Allergy    Anxiety    Back pain    Bilateral swelling of feet    Constipation    Depression    Elevated liver enzymes    Endometriosis    Hyperlipidemia    hx of, no medications   Hypertension 2016   Intercranial Hypertension   Intracranial hypertension    Joint pain    Other fatigue    Pseudotumor cerebri induced by OCPs 05/22/2017   Shortness of breath on exertion    Subclinical hypothyroidism    Ventral hernia    Vitamin D deficiency      Past Surgical History:  Procedure Laterality Date   ABLATION ON ENDOMETRIOSIS     ROBOTIC ASSISTED LAPAROSCOPIC PARTIAL CYSTECTOMY      Medications  Current Outpatient Medications on File Prior to Visit  Medication Sig Dispense Refill   Cetirizine HCl (ZYRTEC PO) Take 1 tablet by mouth daily.     MAGNESIUM PO Take by mouth. Brand: Pure Encapsulate     Multiple Vitamins-Minerals (MULTIVITAMIN PO) Take 1 tablet by mouth daily.     Omega-3 Fatty Acids (FISH OIL) 1000 MG CAPS Take by mouth.     Vitamin D, Cholecalciferol, 25 MCG (1000 UT) TABS Take 2 tablets by mouth daily.     Vitamin D, Ergocalciferol, (DRISDOL) 1.25 MG (50000 UNIT) CAPS capsule Take 1 capsule (50,000 Units total) by mouth every 7 (seven) days. 12 capsule 0   Allergies Allergies  Allergen Reactions   Estrogens     BIH   Zithromax [Azithromycin] Hives   Doxycycline Other (See Comments)    IIH hx, can raise ICP   Levonorgestrel-Ethinyl Estrad  Other (See Comments)    Review of Systems: Constitutional:  no unexpected weight changes Eye:  no recent significant change in vision Ear/Nose/Mouth/Throat:  Ears:  no recent change in hearing Nose/Mouth/Throat:  no complaints of nasal congestion, no sore throat Cardiovascular: no chest pain Respiratory:  no shortness of breath Gastrointestinal:  no abdominal pain, no change in bowel habits GU:  Female: negative for dysuria or pelvic pain Musculoskeletal/Extremities:  no pain of the joints Integumentary (Skin/Breast):  raised lesion on L hip; otherwise no abnormal skin lesions reported Neurologic:  no headaches Endocrine:  denies fatigue Hematologic/Lymphatic:  No areas of easy bleeding  Exam BP 110/72 (BP Location: Left Arm, Patient Position: Sitting, Cuff Size: Large)   Pulse 61   Temp 98.7 F (37.1 C) (Oral)   Ht 5\' 6"  (1.676 m)   Wt 204 lb 4 oz (92.6 kg)   LMP 11/27/2019   SpO2 96%   BMI 32.97 kg/m  General:  well developed, well nourished, in no apparent distress Skin:  slightly hyperpigmented lesion on anterior L hip that is raised and scaly. Otherwise no significant moles, warts, or growths Head:  no masses, lesions, or tenderness Eyes:  pupils equal and round, sclera anicteric without injection Ears:  canals without lesions, TMs shiny without  retraction, no obvious effusion, no erythema Nose:  nares patent, mucosa normal, and no drainage Throat/Pharynx:  lips and gingiva without lesion; tongue and uvula midline; non-inflamed pharynx; no exudates or postnasal drainage Neck: neck supple without adenopathy, thyromegaly, or masses Lungs:  clear to auscultation, breath sounds equal bilaterally, no respiratory distress Cardio:  regular rate and rhythm, no LE edema Abdomen:  abdomen soft, nontender; bowel sounds normal; no masses or organomegaly Genital: Defer to GYN Musculoskeletal:  symmetrical muscle groups noted without atrophy or deformity Extremities:  no clubbing,  cyanosis, or edema, no deformities, no skin discoloration Neuro:  gait normal; deep tendon reflexes normal and symmetric Psych: well oriented with normal range of affect and appropriate judgment/insight  Assessment and Plan  Well adult exam - Plan: CBC, Comprehensive metabolic panel, Lipid panel  Skin lesion - Plan: Ambulatory referral to Dermatology  Skin cancer screening - Plan: Ambulatory referral to Dermatology   Well 42 y.o. female. Counseled on diet and exercise. Advanced directive form provided today.  Flu shot.  Other orders as above. Follow up in 1 yr. The patient voiced understanding and agreement to the plan.  Jilda Roche [place], DO 11/14/22 10:12 AM

## 2022-11-14 NOTE — Patient Instructions (Addendum)
Give us 2-3 business days to get the results of your labs back.   Keep the diet clean and stay active.  Please get me a copy of your advanced directive form at your convenience.   If you do not hear anything about your referral in the next 1-2 weeks, call our office and ask for an update.  Let us know if you need anything. 

## 2022-11-14 NOTE — Addendum Note (Signed)
Addended by: Scharlene Gloss B on: 11/14/2022 10:19 AM   Modules accepted: Orders

## 2022-11-26 DIAGNOSIS — Z129 Encounter for screening for malignant neoplasm, site unspecified: Secondary | ICD-10-CM | POA: Diagnosis not present

## 2022-11-26 DIAGNOSIS — D225 Melanocytic nevi of trunk: Secondary | ICD-10-CM | POA: Diagnosis not present

## 2022-11-26 DIAGNOSIS — L821 Other seborrheic keratosis: Secondary | ICD-10-CM | POA: Diagnosis not present

## 2022-11-26 DIAGNOSIS — D2261 Melanocytic nevi of right upper limb, including shoulder: Secondary | ICD-10-CM | POA: Diagnosis not present

## 2022-11-26 DIAGNOSIS — L720 Epidermal cyst: Secondary | ICD-10-CM | POA: Diagnosis not present

## 2022-11-26 DIAGNOSIS — D2262 Melanocytic nevi of left upper limb, including shoulder: Secondary | ICD-10-CM | POA: Diagnosis not present

## 2022-11-26 DIAGNOSIS — L918 Other hypertrophic disorders of the skin: Secondary | ICD-10-CM | POA: Diagnosis not present

## 2022-12-11 ENCOUNTER — Encounter (INDEPENDENT_AMBULATORY_CARE_PROVIDER_SITE_OTHER): Payer: Self-pay | Admitting: Family Medicine

## 2022-12-11 ENCOUNTER — Ambulatory Visit (INDEPENDENT_AMBULATORY_CARE_PROVIDER_SITE_OTHER): Payer: 59 | Admitting: Family Medicine

## 2022-12-11 VITALS — BP 121/81 | HR 65 | Temp 97.9°F | Ht 67.0 in | Wt 201.0 lb

## 2022-12-11 DIAGNOSIS — E669 Obesity, unspecified: Secondary | ICD-10-CM

## 2022-12-11 DIAGNOSIS — Z6831 Body mass index (BMI) 31.0-31.9, adult: Secondary | ICD-10-CM

## 2022-12-11 DIAGNOSIS — E559 Vitamin D deficiency, unspecified: Secondary | ICD-10-CM | POA: Diagnosis not present

## 2022-12-11 DIAGNOSIS — R632 Polyphagia: Secondary | ICD-10-CM | POA: Diagnosis not present

## 2022-12-11 DIAGNOSIS — E785 Hyperlipidemia, unspecified: Secondary | ICD-10-CM | POA: Diagnosis not present

## 2022-12-11 DIAGNOSIS — E782 Mixed hyperlipidemia: Secondary | ICD-10-CM

## 2022-12-11 NOTE — Progress Notes (Signed)
.smr  Office: [phone]  /  Fax: [phone]  WEIGHT SUMMARY AND BIOMETRICS  Anthropometric Measurements Height: 5\' 7"  (1.702 m) Weight: 201 lb (91.2 kg) BMI (Calculated): 31.47 Weight at Last Visit: 199 lb Weight Lost Since Last Visit: 0 Weight Gained Since Last Visit: 2 lb Starting Weight: 200 lb Total Weight Loss (lbs): 0 lb (0 kg)   Body Composition  Body Fat %: 38.9 % Fat Mass (lbs): 78.4 lbs Muscle Mass (lbs): 117 lbs Total Body Water (lbs): 78.2 lbs Visceral Fat Rating : 8   Other Clinical Data Fasting: No Labs: No Today's Visit #: 23 Starting Date: 03/17/20    Chief Complaint: OBESITY   History of Present Illness   The patient, diagnosed with obesity and vitamin D deficiency, reports a weight gain of two pounds over the last three months. She has been adhering to a category two eating plan approximately 75% of the time. The patient's work at [employer] has increased her physical activity, but has not facilitated healthier eating habits. She reports consuming an egg sandwich for breakfast during the market days, prioritizing protein intake despite the less than ideal food choices available.  The patient also discusses her upcoming Thanksgiving plans, which will only involve one other person. She expresses a commitment to maintaining control over portion sizes and food choices during the holiday. 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  The patient has a history of endometriosis, which has affected her gastrointestinal transit time, necessitating a daily fiber supplement. She expresses concerns about potential interactions between her hormonal sensitivities and any new medications. The patient is considering a GLP-1 drug for weight management, but has reservations about potential side effects and the sustainability of weight loss after  discontinuing the medication. She plans to deliberate on this option before her next appointment.          PHYSICAL EXAM:  Blood pressure 121/81, pulse 65, temperature 97.9 F (36.6 C), height 5\' 7"  (1.702 m), weight 201 lb (91.2 kg), last menstrual period 11/27/2019, SpO2 96%. Body mass index is 31.48 kg/m.  DIAGNOSTIC DATA REVIEWED:  BMET    Component Value Date/Time   NA 137 11/14/2022 1025   NA 139 07/31/2022 0910   K 4.3 11/14/2022 1025   CL 105 11/14/2022 1025   CO2 25 11/14/2022 1025   GLUCOSE 97 11/14/2022 1025   BUN 16 11/14/2022 1025   BUN 16 07/31/2022 0910   CREATININE 0.63 11/14/2022 1025   CREATININE 0.64 11/20/2019 1201   CALCIUM 9.4 11/14/2022 1025   GFRNONAA 113 03/17/2020 0846   GFRNONAA 112 11/20/2019 1201   GFRAA 130 03/17/2020 0846   GFRAA 129 11/20/2019 1201   Lab Results  Component Value Date   HGBA1C 5.5 07/31/2022   HGBA1C 5.2 03/17/2020   Lab Results  Component Value Date   INSULIN 9.0 07/31/2022   INSULIN 6.4 03/17/2020   Lab Results  Component Value Date   TSH 2.300 07/31/2022   CBC    Component Value Date/Time   WBC 9.3 11/14/2022 1025   RBC 4.76 11/14/2022 1025   HGB 14.9 11/14/2022 1025   HCT 44.7 11/14/2022 1025   PLT 242.0 11/14/2022 1025   MCV 93.9 11/14/2022 1025   MCH 31.9 02/13/2022 0805   MCHC 33.2 11/14/2022 1025   RDW 12.7 11/14/2022 1025   Iron Studies No results found for: "IRON", "TIBC", "FERRITIN", "  IRONPCTSAT" Lipid Panel     Component Value Date/Time   CHOL 195 11/14/2022 1025   CHOL 201 (H) 07/31/2022 0910   TRIG 168.0 (H) 11/14/2022 1025   HDL 35.30 (L) 11/14/2022 1025   HDL 36 (L) 07/31/2022 0910   CHOLHDL 6 11/14/2022 1025   VLDL 33.6 11/14/2022 1025   LDLCALC 127 (H) 11/14/2022 1025   LDLCALC 126 (H) 07/31/2022 0910   LDLCALC 138 (H) 11/20/2019 1201   LDLDIRECT 159.0 10/12/2021 0929   Hepatic Function Panel     Component Value Date/Time   PROT 6.6 11/14/2022 1025   PROT 6.5 07/31/2022  0910   ALBUMIN 4.5 11/14/2022 1025   ALBUMIN 4.5 07/31/2022 0910   AST 23 11/14/2022 1025   ALT 32 11/14/2022 1025   ALKPHOS 86 11/14/2022 1025   BILITOT 0.7 11/14/2022 1025   BILITOT 0.3 07/31/2022 0910   BILIDIR 0.1 12/08/2021 0751      Component Value Date/Time   TSH 2.300 07/31/2022 0910   Nutritional Lab Results  Component Value Date   VD25OH 31.3 07/31/2022   VD25OH 44.7 08/10/2021   VD25OH 43.6 02/27/2021     Assessment and Plan    Obesity and polyphagia Obesity with recent weight gain of two pounds over the last three months. Following the category two eating plan 75% of the time and increased physical activity due to work demands. Discussed potential use of GLP-1 agonists (e.g., ZOXWRU) for weight management. Risks include nausea, gallbladder issues, and thyroid tumors. Benefits include reduced cravings and slower GI transit. Emphasized proper nutrition and gradual weight loss to avoid adverse effects. Discussed preference to avoid Wellbutrin due to past adverse effects. Explained that GLP-1 drugs like Wegovy and Ozempic are gut hormones, not affecting mood or sex hormones. Discussed the importance of adequate protein intake and hydration to prevent muscle mass loss and constipation. Explained that proper follow-up and nutrition can prevent weight regain after stopping the medication. - Consider GLP-1 agonist therapy (e.g., Wegovy) starting at 0.25 mg per week if she decides to proceed - Monitor nutritional intake and ensure adequate protein consumption - Encourage continued physical activity - Follow up in one month to reassess and discuss further options  Hyperlipidemia Hyperlipidemia with recent improvement in triglycerides (from 220 to 168) and well-managed LDL and HDL levels. Discussed the relationship between carbohydrate intake and cholesterol levels, and the role of physical activity in increasing HDL. - Continue monitoring lipid levels - Encourage reduction in  carbohydrate intake - Promote regular physical activity to increase HDL levels  Vitamin D Deficiency Vitamin D deficiency was mentioned but not discussed in detail during this visit. - Review vitamin D levels and consider supplementation if necessary  General Health Maintenance Discussed the importance of maintaining a healthy diet and regular exercise. - Encourage adherence to a balanced diet and regular physical activity - Schedule follow-up appointments as needed  Follow-up - Follow up in December - Schedule follow-up appointment in January due to upcoming work commitments.       She was informed of the importance of frequent follow up visits to maximize her success with intensive lifestyle modifications for her multiple health conditions.    Quillian Quince, MD

## 2023-01-10 ENCOUNTER — Ambulatory Visit (INDEPENDENT_AMBULATORY_CARE_PROVIDER_SITE_OTHER): Payer: 59 | Admitting: Family Medicine

## 2023-02-13 ENCOUNTER — Ambulatory Visit (INDEPENDENT_AMBULATORY_CARE_PROVIDER_SITE_OTHER): Payer: 59 | Admitting: Family Medicine

## 2023-02-13 ENCOUNTER — Encounter (INDEPENDENT_AMBULATORY_CARE_PROVIDER_SITE_OTHER): Payer: Self-pay | Admitting: Family Medicine

## 2023-02-13 VITALS — BP 123/84 | HR 77 | Temp 98.3°F | Ht 67.0 in | Wt 203.0 lb

## 2023-02-13 DIAGNOSIS — F419 Anxiety disorder, unspecified: Secondary | ICD-10-CM | POA: Diagnosis not present

## 2023-02-13 DIAGNOSIS — M25519 Pain in unspecified shoulder: Secondary | ICD-10-CM | POA: Diagnosis not present

## 2023-02-13 DIAGNOSIS — E669 Obesity, unspecified: Secondary | ICD-10-CM | POA: Diagnosis not present

## 2023-02-13 DIAGNOSIS — E559 Vitamin D deficiency, unspecified: Secondary | ICD-10-CM

## 2023-02-13 DIAGNOSIS — M542 Cervicalgia: Secondary | ICD-10-CM | POA: Diagnosis not present

## 2023-02-13 DIAGNOSIS — Z6831 Body mass index (BMI) 31.0-31.9, adult: Secondary | ICD-10-CM | POA: Diagnosis not present

## 2023-02-13 MED ORDER — VITAMIN D (ERGOCALCIFEROL) 1.25 MG (50000 UNIT) PO CAPS: 50000.0000 [IU] | ORAL_CAPSULE | ORAL | 0 refills | Status: DC

## 2023-02-13 NOTE — Progress Notes (Signed)
.smr  Office: [phone]  /  Fax: [phone]  WEIGHT SUMMARY AND BIOMETRICS  Anthropometric Measurements Height: 5\' 7"  (1.702 m) Weight: 203 lb (92.1 kg) BMI (Calculated): 31.79 Weight at Last Visit: 201 lb Weight Lost Since Last Visit: 0 Weight Gained Since Last Visit: 2 lb   Body Composition  Body Fat %: 39.9 % Fat Mass (lbs): 81.2 lbs Muscle Mass (lbs): 116.2 lbs Total Body Water (lbs): 78 lbs Visceral Fat Rating : 9   Other Clinical Data Today's Visit #: 24    Chief Complaint: OBESITY  History of Present Illness   The patient, with a known history of obesity and vitamin D deficiency, presents for a follow-up visit. Over the past two months, the patient reports a weight gain of two pounds, which she attributes to the holiday season and a recent illness. She describes adherence to a category two eating plan approximately 70% of the time but admits to a lack of regular exercise. The patient is currently on prescription vitamin D 50,000 international units weekly for vitamin D deficiency.  The patient describes feeling "fat and sassy" and bloated, which she attributes to recent weight gain and the holiday season. She also reports a recent illness, which was not COVID-19 or the flu, that caused her to feel sick and cough for weeks. This illness, combined with the holiday season, disrupted her regular eating and exercise habits.  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 She attempts to manage this stress through stretching, taking baths, and mental exercises, but expresses concern about the impact of this stress on her health. She also expresses a desire to change jobs but feels constrained by financial considerations.  The patient has a history of a hysterectomy due to adenomyosis and endometriosis. She reports feeling less intense and anxious since the procedure, suggesting a possible reduction in inflammation and  cortisol levels.  The patient acknowledges the need for regular exercise and expresses a desire to incorporate more movement into her daily routine. She also expresses interest in exploring stress management medications and strategies. The patient plans to attend a work event in [facility], which she anticipates will increase her daily step count. She expresses a desire to maintain this increased activity level upon returning home.  The patient is also considering dietary changes, including incorporating more protein into her diet and reducing dairy intake. She expresses a desire to lose weight and improve her overall health. She is currently taking FiberWise daily and plans to continue this regimen. The patient acknowledges the need for regular vitamin D level checks due to her known deficiency.          PHYSICAL EXAM:  Blood pressure 123/84, pulse 77, temperature 98.3 F (36.8 C), height 5\' 7"  (1.702 m), weight 203 lb (92.1 kg), last menstrual period 11/27/2019, SpO2 99%. Body mass index is 31.79 kg/m.  DIAGNOSTIC DATA REVIEWED:  BMET    Component Value Date/Time   NA 137 11/14/2022 1025   NA 139 07/31/2022 0910   K 4.3 11/14/2022 1025   CL 105 11/14/2022 1025   CO2 25 11/14/2022 1025   GLUCOSE 97 11/14/2022 1025   BUN 16 11/14/2022 1025   BUN 16 07/31/2022 0910   CREATININE 0.63 11/14/2022 1025   CREATININE 0.64 11/20/2019 1201   CALCIUM 9.4 11/14/2022 1025   GFRNONAA 113 03/17/2020 0846   GFRNONAA 112 11/20/2019 1201   GFRAA 130 03/17/2020 0846   GFRAA 129 11/20/2019 1201  Lab Results  Component Value Date   HGBA1C 5.5 07/31/2022   HGBA1C 5.2 03/17/2020   Lab Results  Component Value Date   INSULIN 9.0 07/31/2022   INSULIN 6.4 03/17/2020   Lab Results  Component Value Date   TSH 2.300 07/31/2022   CBC    Component Value Date/Time   WBC 9.3 11/14/2022 1025   RBC 4.76 11/14/2022 1025   HGB 14.9 11/14/2022 1025   HCT 44.7 11/14/2022 1025   PLT 242.0  11/14/2022 1025   MCV 93.9 11/14/2022 1025   MCH 31.9 02/13/2022 0805   MCHC 33.2 11/14/2022 1025   RDW 12.7 11/14/2022 1025   Iron Studies No results found for: "IRON", "TIBC", "FERRITIN", "IRONPCTSAT" Lipid Panel     Component Value Date/Time   CHOL 195 11/14/2022 1025   CHOL 201 (H) 07/31/2022 0910   TRIG 168.0 (H) 11/14/2022 1025   HDL 35.30 (L) 11/14/2022 1025   HDL 36 (L) 07/31/2022 0910   CHOLHDL 6 11/14/2022 1025   VLDL 33.6 11/14/2022 1025   LDLCALC 127 (H) 11/14/2022 1025   LDLCALC 126 (H) 07/31/2022 0910   LDLCALC 138 (H) 11/20/2019 1201   LDLDIRECT 159.0 10/12/2021 0929   Hepatic Function Panel     Component Value Date/Time   PROT 6.6 11/14/2022 1025   PROT 6.5 07/31/2022 0910   ALBUMIN 4.5 11/14/2022 1025   ALBUMIN 4.5 07/31/2022 0910   AST 23 11/14/2022 1025   ALT 32 11/14/2022 1025   ALKPHOS 86 11/14/2022 1025   BILITOT 0.7 11/14/2022 1025   BILITOT 0.3 07/31/2022 0910   BILIDIR 0.1 12/08/2021 0751      Component Value Date/Time   TSH 2.300 07/31/2022 0910   Nutritional Lab Results  Component Value Date   VD25OH 31.3 07/31/2022   VD25OH 44.7 08/10/2021   VD25OH 43.6 02/27/2021     Assessment and Plan    Obesity Gained two pounds since the last visit two months ago, likely due to holiday eating and illness. Following a category two eating plan about 70% of the time and not exercising. Discussed the importance of consistent physical activity, benefits of strength training over cardio for weight loss and metabolism improvement, and the impact of stress on weight gain. Emphasized the genetic component of obesity and the importance of movement in weight management. - Encourage 20 minutes of exercise three times a week - Aim for 5,000 steps per day - Reassess weight and exercise routine in six weeks  Anxiety Reports stress and anxiety due to work responsibilities and financial concerns. Discussed non-pharmacological strategies for managing stress,  including stretching, baths, and mental reframing. Mentioned the possibility of medication if needed, and expressed interest in a low-dose medication similar to what her mother takes. Discussed potential benefits of medication in reducing anxiety and improving quality of life. - Consider medication options for anxiety if non-pharmacological strategies are insufficient - Encourage continued use of stress management techniques  Vitamin D Deficiency On prescription vitamin D 50,000 IU weekly. Last level checked in July was 31, which had fallen. Discussed the importance of monitoring vitamin D levels regularly. - Refill prescription for vitamin D 50,000 IU weekly - Check vitamin D levels at the next visit  General Health Maintenance Discussed healthy eating options while traveling, including protein-rich foods and avoiding high-calorie, low-nutrient options. Emphasized the importance of maintaining hydration and having healthy snacks available. - Recommend protein-rich options like egg bites and egg white wraps at [employer] - Suggest keeping yogurts, Babybel cheeses, and jerky  in the hotel room for healthy snacks  Follow-up - Schedule follow-up appointment in six weeks.         I have personally spent 40 minutes total time today in preparation, patient care, and documentation for this visit, including the following: review of clinical lab tests; review of medical tests/procedures/services.    She was informed of the importance of frequent follow up visits to maximize her success with intensive lifestyle modifications for her multiple health conditions.    Quillian Quince, MD

## 2023-02-28 DIAGNOSIS — Z01419 Encounter for gynecological examination (general) (routine) without abnormal findings: Secondary | ICD-10-CM | POA: Diagnosis not present

## 2023-02-28 DIAGNOSIS — N809 Endometriosis, unspecified: Secondary | ICD-10-CM | POA: Diagnosis not present

## 2023-03-21 DIAGNOSIS — R92323 Mammographic fibroglandular density, bilateral breasts: Secondary | ICD-10-CM | POA: Diagnosis not present

## 2023-03-21 DIAGNOSIS — Z1231 Encounter for screening mammogram for malignant neoplasm of breast: Secondary | ICD-10-CM | POA: Diagnosis not present

## 2023-03-21 LAB — HM MAMMOGRAPHY

## 2023-03-22 DIAGNOSIS — M542 Cervicalgia: Secondary | ICD-10-CM | POA: Diagnosis not present

## 2023-03-22 DIAGNOSIS — M25519 Pain in unspecified shoulder: Secondary | ICD-10-CM | POA: Diagnosis not present

## 2023-03-25 ENCOUNTER — Ambulatory Visit (INDEPENDENT_AMBULATORY_CARE_PROVIDER_SITE_OTHER): Payer: 59 | Admitting: Family Medicine

## 2023-03-25 ENCOUNTER — Encounter (INDEPENDENT_AMBULATORY_CARE_PROVIDER_SITE_OTHER): Payer: Self-pay | Admitting: Family Medicine

## 2023-03-25 VITALS — BP 108/74 | HR 69 | Temp 97.7°F | Ht 67.0 in | Wt 204.0 lb

## 2023-03-25 DIAGNOSIS — Z6831 Body mass index (BMI) 31.0-31.9, adult: Secondary | ICD-10-CM

## 2023-03-25 DIAGNOSIS — E785 Hyperlipidemia, unspecified: Secondary | ICD-10-CM

## 2023-03-25 DIAGNOSIS — J302 Other seasonal allergic rhinitis: Secondary | ICD-10-CM | POA: Diagnosis not present

## 2023-03-25 DIAGNOSIS — E782 Mixed hyperlipidemia: Secondary | ICD-10-CM

## 2023-03-25 DIAGNOSIS — E88819 Insulin resistance, unspecified: Secondary | ICD-10-CM

## 2023-03-25 DIAGNOSIS — E559 Vitamin D deficiency, unspecified: Secondary | ICD-10-CM

## 2023-03-25 DIAGNOSIS — E669 Obesity, unspecified: Secondary | ICD-10-CM

## 2023-03-25 DIAGNOSIS — Z6832 Body mass index (BMI) 32.0-32.9, adult: Secondary | ICD-10-CM

## 2023-03-25 NOTE — Progress Notes (Signed)
 .smr  Office: [phone]  /  Fax: [phone]  WEIGHT SUMMARY AND BIOMETRICS  Anthropometric Measurements Height: 5\' 7"  (1.702 m) Weight: 204 lb (92.5 kg) BMI (Calculated): 31.94 Weight at Last Visit: 203 lb Weight Lost Since Last Visit: 0 Weight Gained Since Last Visit: 1 lb Starting Weight: 200 lb Total Weight Loss (lbs): 0 lb (0 kg) Peak Weight: 204 lb   Body Composition  Body Fat %: 39 % Fat Mass (lbs): 79.8 lbs Muscle Mass (lbs): 118.6 lbs Total Body Water (lbs): 79.4 lbs Visceral Fat Rating : 8   Other Clinical Data Fasting: yes Labs: yes Today's Visit #: 25 Starting Date: 03/17/20    Chief Complaint: OBESITY    History of Present Illness   Erika Garrett is a 43 year old female who presents for obesity treatment plan and progress monitoring.  She has gained one pound in the last six weeks since her last visit. She is adhering to the category two eating plan seventy-five percent of the time and engages in walking for exercise for thirty to sixty minutes twice a week. She anticipates increasing her activity level as the weather improves.  She has a history of insulin resistance, managed through diet, exercise, and weight loss. Her last A1c was controlled at 5.5, but her insulin level was elevated at nine. She is not currently on medications for this condition and is due for laboratory tests.  She has a history of mixed hyperlipidemia characterized by elevated triglycerides, elevated LDL, and low HDL. She is not on statins and no longer takes 1000 mg of fish oil omega-3s daily.  She has a history of vitamin D deficiency, with her last vitamin D level below goal at thirty-one.  She takes Zyrtec nightly for general allergies and has been doing so for the past four to five years without significant side effects.          PHYSICAL EXAM:  Blood pressure 108/74, pulse 69, temperature 97.7 F (36.5 C), height 5\' 7"  (1.702 m), weight 204 lb (92.5 kg), last  menstrual period 11/27/2019, SpO2 97%. Body mass index is 31.95 kg/m.  DIAGNOSTIC DATA REVIEWED:  BMET    Component Value Date/Time   NA 137 11/14/2022 1025   NA 139 07/31/2022 0910   K 4.3 11/14/2022 1025   CL 105 11/14/2022 1025   CO2 25 11/14/2022 1025   GLUCOSE 97 11/14/2022 1025   BUN 16 11/14/2022 1025   BUN 16 07/31/2022 0910   CREATININE 0.63 11/14/2022 1025   CREATININE 0.64 11/20/2019 1201   CALCIUM 9.4 11/14/2022 1025   GFRNONAA 113 03/17/2020 0846   GFRNONAA 112 11/20/2019 1201   GFRAA 130 03/17/2020 0846   GFRAA 129 11/20/2019 1201   Lab Results  Component Value Date   HGBA1C 5.5 07/31/2022   HGBA1C 5.2 03/17/2020   Lab Results  Component Value Date   INSULIN 9.0 07/31/2022   INSULIN 6.4 03/17/2020   Lab Results  Component Value Date   TSH 2.300 07/31/2022   CBC    Component Value Date/Time   WBC 9.3 11/14/2022 1025   RBC 4.76 11/14/2022 1025   HGB 14.9 11/14/2022 1025   HCT 44.7 11/14/2022 1025   PLT 242.0 11/14/2022 1025   MCV 93.9 11/14/2022 1025   MCH 31.9 02/13/2022 0805   MCHC 33.2 11/14/2022 1025   RDW 12.7 11/14/2022 1025   Iron Studies No results found for: "IRON", "TIBC", "FERRITIN", "IRONPCTSAT" Lipid Panel     Component Value Date/Time  CHOL 195 11/14/2022 1025   CHOL 201 (H) 07/31/2022 0910   TRIG 168.0 (H) 11/14/2022 1025   HDL 35.30 (L) 11/14/2022 1025   HDL 36 (L) 07/31/2022 0910   CHOLHDL 6 11/14/2022 1025   VLDL 33.6 11/14/2022 1025   LDLCALC 127 (H) 11/14/2022 1025   LDLCALC 126 (H) 07/31/2022 0910   LDLCALC 138 (H) 11/20/2019 1201   LDLDIRECT 159.0 10/12/2021 0929   Hepatic Function Panel     Component Value Date/Time   PROT 6.6 11/14/2022 1025   PROT 6.5 07/31/2022 0910   ALBUMIN 4.5 11/14/2022 1025   ALBUMIN 4.5 07/31/2022 0910   AST 23 11/14/2022 1025   ALT 32 11/14/2022 1025   ALKPHOS 86 11/14/2022 1025   BILITOT 0.7 11/14/2022 1025   BILITOT 0.3 07/31/2022 0910   BILIDIR 0.1 12/08/2021 0751       Component Value Date/Time   TSH 2.300 07/31/2022 0910   Nutritional Lab Results  Component Value Date   VD25OH 31.3 07/31/2022   VD25OH 44.7 08/10/2021   VD25OH 43.6 02/27/2021     Assessment and Plan    Obesity Gained one pound in the last six weeks. Following category two eating plan 75% of the time and walking 30-60 minutes twice weekly. Prefers category two plan over pescetarian due to limited access to fresh seafood. Discussed increasing physical activity as weather improves and importance of hydration to manage sodium intake from processed foods. - Continue category two eating plan - Increase physical activity as weather permits - Ensure adequate hydration - Follow up in six weeks  Insulin Resistance Managed with diet, exercise, and weight loss. Last A1c was 5.5, insulin elevated at 9. Due for labs. - Order glucose and insulin levels - Continue category two eating plan - Increase physical activity as weather permits  Hyperlipidemia Mixed hyperlipidemia with elevated triglycerides, elevated LDL, and low HDL. Takes 1000 mg of fish oil daily. Due for labs. - Order lipid panel - Continue category two eating plan - Increase physical activity as weather permits  Vitamin D Deficiency Last vitamin D level was 31, below goal. Due for labs. - Order vitamin D level and follow    Seasonal Allergies  Takes Zyrtec nightly for allergies. - Continue Zyrtec nightly, ok to stop to see if she still needs this, and if so, she is ok to restart   Follow-up - Schedule follow-up appointment for the week of April 14th.          She was informed of the importance of frequent follow up visits to maximize her success with intensive lifestyle modifications for her multiple health conditions.    Quillian Quince, MD

## 2023-03-26 ENCOUNTER — Encounter (INDEPENDENT_AMBULATORY_CARE_PROVIDER_SITE_OTHER): Payer: Self-pay | Admitting: Family Medicine

## 2023-03-26 LAB — CMP14+EGFR
ALT: 67 IU/L — ABNORMAL HIGH (ref 0–32)
AST: 37 IU/L (ref 0–40)
Albumin: 4.4 g/dL (ref 3.9–4.9)
Alkaline Phosphatase: 95 IU/L (ref 44–121)
BUN/Creatinine Ratio: 25 — ABNORMAL HIGH (ref 9–23)
BUN: 18 mg/dL (ref 6–24)
Bilirubin Total: 0.3 mg/dL (ref 0.0–1.2)
CO2: 21 mmol/L (ref 20–29)
Calcium: 9.9 mg/dL (ref 8.7–10.2)
Chloride: 105 mmol/L (ref 96–106)
Creatinine, Ser: 0.71 mg/dL (ref 0.57–1.00)
Globulin, Total: 1.8 g/dL (ref 1.5–4.5)
Glucose: 98 mg/dL (ref 70–99)
Potassium: 4.6 mmol/L (ref 3.5–5.2)
Sodium: 140 mmol/L (ref 134–144)
Total Protein: 6.2 g/dL (ref 6.0–8.5)
eGFR: 108 mL/min/{1.73_m2} (ref >59)

## 2023-03-26 LAB — HEMOGLOBIN A1C
Est. average glucose Bld gHb Est-mCnc: 105 mg/dL
Hgb A1c MFr Bld: 5.3 % (ref 4.8–5.6)

## 2023-03-26 LAB — LIPID PANEL WITH LDL/HDL RATIO
Cholesterol, Total: 215 mg/dL — ABNORMAL HIGH (ref 100–199)
HDL: 42 mg/dL (ref >39)
LDL Chol Calc (NIH): 142 mg/dL — ABNORMAL HIGH (ref 0–99)
LDL/HDL Ratio: 3.4 ratio — ABNORMAL HIGH (ref 0.0–3.2)
Triglycerides: 173 mg/dL — ABNORMAL HIGH (ref 0–149)
VLDL Cholesterol Cal: 31 mg/dL (ref 5–40)

## 2023-03-26 LAB — INSULIN, RANDOM: INSULIN: 13.1 u[IU]/mL (ref 2.6–24.9)

## 2023-03-26 LAB — VITAMIN D 25 HYDROXY (VIT D DEFICIENCY, FRACTURES): Vit D, 25-Hydroxy: 40.5 ng/mL (ref 30.0–100.0)

## 2023-03-26 LAB — VITAMIN B12: Vitamin B-12: 595 pg/mL (ref 232–1245)

## 2023-04-02 DIAGNOSIS — M542 Cervicalgia: Secondary | ICD-10-CM | POA: Diagnosis not present

## 2023-04-02 DIAGNOSIS — M25519 Pain in unspecified shoulder: Secondary | ICD-10-CM | POA: Diagnosis not present

## 2023-04-24 DIAGNOSIS — M25519 Pain in unspecified shoulder: Secondary | ICD-10-CM | POA: Diagnosis not present

## 2023-04-24 DIAGNOSIS — M542 Cervicalgia: Secondary | ICD-10-CM | POA: Diagnosis not present

## 2023-05-07 ENCOUNTER — Encounter (INDEPENDENT_AMBULATORY_CARE_PROVIDER_SITE_OTHER): Payer: Self-pay | Admitting: Family Medicine

## 2023-05-07 ENCOUNTER — Ambulatory Visit (INDEPENDENT_AMBULATORY_CARE_PROVIDER_SITE_OTHER): Admitting: Family Medicine

## 2023-05-07 VITALS — BP 118/79 | HR 71 | Temp 97.8°F | Ht 67.0 in | Wt 202.0 lb

## 2023-05-07 DIAGNOSIS — R7989 Other specified abnormal findings of blood chemistry: Secondary | ICD-10-CM

## 2023-05-07 DIAGNOSIS — E669 Obesity, unspecified: Secondary | ICD-10-CM | POA: Diagnosis not present

## 2023-05-07 DIAGNOSIS — E559 Vitamin D deficiency, unspecified: Secondary | ICD-10-CM | POA: Diagnosis not present

## 2023-05-07 DIAGNOSIS — E785 Hyperlipidemia, unspecified: Secondary | ICD-10-CM | POA: Diagnosis not present

## 2023-05-07 DIAGNOSIS — Z6831 Body mass index (BMI) 31.0-31.9, adult: Secondary | ICD-10-CM | POA: Diagnosis not present

## 2023-05-07 DIAGNOSIS — E782 Mixed hyperlipidemia: Secondary | ICD-10-CM

## 2023-05-07 NOTE — Progress Notes (Signed)
 Office: [phone]  /  Fax: [phone]  WEIGHT SUMMARY AND BIOMETRICS  Anthropometric Measurements Height: 5\' 7"  (1.702 m) Weight: 202 lb (91.6 kg) BMI (Calculated): 31.63 Weight at Last Visit: 204lb Weight Lost Since Last Visit: 2lb Weight Gained Since Last Visit: 0lb Starting Weight: 200lb Total Weight Loss (lbs): 0 lb (0 kg) Peak Weight: 204lb   Body Composition  Body Fat %: 37.8 % Fat Mass (lbs): 76.6 lbs Muscle Mass (lbs): 119.8 lbs Total Body Water (lbs): 78 lbs Visceral Fat Rating : 8   Other Clinical Data Fasting: Yes Labs: Yes Today's Visit #: 26 Starting Date: 03/17/20    Chief Complaint: OBESITY   History of Present Illness Erika Garrett is a 43 year old female with obesity who presents for a follow-up on her treatment plan and progress assessment.  She follows her prescribed category two eating plan approximately 75% of the time and engages in physical activity by walking for exercise for 30 minutes twice a week. Despite these efforts, she has gained two pounds in the last month. She recently started taking a supplement called Wonder Calm, which contains ashwagandha and L-theanine, since last Friday. She notices a reduction in tension and stress, which she believes may have been affecting her weight loss efforts. She experiences tension in her shoulders and neck, attributing it to stress and possibly her sleeping position. She has a history of jaw grinding and tension-related issues.  She has a history of elevated liver enzymes, with her ALT previously reaching 67. An MRI in 2021 showed a hemangioma but no significant fat in the liver at that time. She has experienced weight gain since moving to [place], which she believes may be contributing to her liver issues. She has a history of heavy use of ibuprofen and naproxen for pain management, which she has since reduced.  Her cholesterol levels have increased with her weight gain, with her LDL currently  at 142. She follows a low cholesterol diet and is aware of the genetic component of her cholesterol levels. She is concerned about the long-term implications of her cholesterol and liver enzyme levels.      PHYSICAL EXAM:  Blood pressure 118/79, pulse 71, temperature 97.8 F (36.6 C), height 5\' 7"  (1.702 m), weight 202 lb (91.6 kg), last menstrual period 11/27/2019, SpO2 98%. Body mass index is 31.64 kg/m.  DIAGNOSTIC DATA REVIEWED:  BMET    Component Value Date/Time   NA 140 03/25/2023 1336   K 4.6 03/25/2023 1336   CL 105 03/25/2023 1336   CO2 21 03/25/2023 1336   GLUCOSE 98 03/25/2023 1336   GLUCOSE 97 11/14/2022 1025   BUN 18 03/25/2023 1336   CREATININE 0.71 03/25/2023 1336   CREATININE 0.64 11/20/2019 1201   CALCIUM 9.9 03/25/2023 1336   GFRNONAA 113 03/17/2020 0846   GFRNONAA 112 11/20/2019 1201   GFRAA 130 03/17/2020 0846   GFRAA 129 11/20/2019 1201   Lab Results  Component Value Date   HGBA1C 5.3 03/25/2023   HGBA1C 5.2 03/17/2020   Lab Results  Component Value Date   INSULIN 13.1 03/25/2023   INSULIN 6.4 03/17/2020   Lab Results  Component Value Date   TSH 2.300 07/31/2022   CBC    Component Value Date/Time   WBC 9.3 11/14/2022 1025   RBC 4.76 11/14/2022 1025   HGB 14.9 11/14/2022 1025   HCT 44.7 11/14/2022 1025   PLT 242.0 11/14/2022 1025   MCV 93.9 11/14/2022 1025   MCH 31.9 02/13/2022 0805  MCHC 33.2 11/14/2022 1025   RDW 12.7 11/14/2022 1025   Iron Studies No results found for: "IRON", "TIBC", "FERRITIN", "IRONPCTSAT" Lipid Panel     Component Value Date/Time   CHOL 215 (H) 03/25/2023 1336   TRIG 173 (H) 03/25/2023 1336   HDL 42 03/25/2023 1336   CHOLHDL 6 11/14/2022 1025   VLDL 33.6 11/14/2022 1025   LDLCALC 142 (H) 03/25/2023 1336   LDLCALC 138 (H) 11/20/2019 1201   LDLDIRECT 159.0 10/12/2021 0929   Hepatic Function Panel     Component Value Date/Time   PROT 6.2 03/25/2023 1336   ALBUMIN 4.4 03/25/2023 1336   AST 37  03/25/2023 1336   ALT 67 (H) 03/25/2023 1336   ALKPHOS 95 03/25/2023 1336   BILITOT 0.3 03/25/2023 1336   BILIDIR 0.1 12/08/2021 0751      Component Value Date/Time   TSH 2.300 07/31/2022 0910   Nutritional Lab Results  Component Value Date   VD25OH 40.5 03/25/2023   VD25OH 31.3 07/31/2022   VD25OH 44.7 08/10/2021     Assessment and Plan Assessment & Plan Obesity She adheres to a category two eating plan 75% of the time and exercises by walking for 30 minutes twice a week. Despite these efforts, she has gained two pounds in the last month. Stress and tension may be contributing to her weight gain. She has started taking [employer], containing ashwagandha and L-theanine, and reports feeling less tense. The role of stress in weight gain and the potential benefits of magnesium threonate for muscle relaxation were discussed. The potential lack of long-term studies on ashwagandha was noted, but no immediate concerns were identified. - Increase adherence to category two eating plan. - Increase exercise frequency and duration. - Continue Wonder Calm supplement. - Consider magnesium threonate for muscle relaxation.  Elevated Liver Enzymes Her ALT was elevated to 67, higher than her normal levels. Fatty liver is suspected as the most likely cause, given her weight gain and history of low-level liver enzyme elevations. Other potential causes such as copper metabolism issues, cysts, or autoimmune conditions were discussed but deemed less likely. A liver MRI in 2021 showed a hemangioma but no significant fat in the liver at that time. The importance of weight loss in improving liver function was emphasized. - Repeat liver function tests to monitor ALT levels. - Consider referral to a gastroenterologist if ALT remains elevated.  Hyperlipidemia Her LDL cholesterol is 142, above the target of less than 100 for her risk factors. Cholesterol levels have increased with weight gain, and a potential  genetic component was discussed. The importance of diet and exercise in managing cholesterol was emphasized, although genetic factors may limit the effectiveness of lifestyle changes alone. The potential need for statins will be assessed by her primary care provider based on risk factors and possibly a cardiac calcium score. - Continue low cholesterol diet. - Increase exercise to improve HDL levels. - Discuss potential need for statins with primary care provider at next visit. Vit D deficiency Vitamin D levels are within acceptable ranges, but vitamin D could be improved. - Continue vitamin D supplementation to reach target levels in the 50s.    She was informed of the importance of frequent follow up visits to maximize her success with intensive lifestyle modifications for her multiple health conditions.    Quillian Quince, MD

## 2023-05-08 ENCOUNTER — Encounter (INDEPENDENT_AMBULATORY_CARE_PROVIDER_SITE_OTHER): Payer: Self-pay | Admitting: Family Medicine

## 2023-05-08 LAB — CMP14+EGFR
ALT: 37 IU/L — ABNORMAL HIGH (ref 0–32)
AST: 22 IU/L (ref 0–40)
Albumin: 4.6 g/dL (ref 3.9–4.9)
Alkaline Phosphatase: 97 IU/L (ref 44–121)
BUN/Creatinine Ratio: 21 (ref 9–23)
BUN: 16 mg/dL (ref 6–24)
Bilirubin Total: 0.4 mg/dL (ref 0.0–1.2)
CO2: 19 mmol/L — ABNORMAL LOW (ref 20–29)
Calcium: 9.5 mg/dL (ref 8.7–10.2)
Chloride: 103 mmol/L (ref 96–106)
Creatinine, Ser: 0.75 mg/dL (ref 0.57–1.00)
Globulin, Total: 2.2 g/dL (ref 1.5–4.5)
Glucose: 96 mg/dL (ref 70–99)
Potassium: 4.5 mmol/L (ref 3.5–5.2)
Sodium: 140 mmol/L (ref 134–144)
Total Protein: 6.8 g/dL (ref 6.0–8.5)
eGFR: 101 mL/min/{1.73_m2} (ref >59)

## 2023-05-30 DIAGNOSIS — M542 Cervicalgia: Secondary | ICD-10-CM | POA: Diagnosis not present

## 2023-05-30 DIAGNOSIS — M25519 Pain in unspecified shoulder: Secondary | ICD-10-CM | POA: Diagnosis not present

## 2023-06-07 DIAGNOSIS — M25519 Pain in unspecified shoulder: Secondary | ICD-10-CM | POA: Diagnosis not present

## 2023-06-07 DIAGNOSIS — M542 Cervicalgia: Secondary | ICD-10-CM | POA: Diagnosis not present

## 2023-06-21 ENCOUNTER — Encounter: Payer: Self-pay | Admitting: Physician Assistant

## 2023-06-24 ENCOUNTER — Ambulatory Visit (INDEPENDENT_AMBULATORY_CARE_PROVIDER_SITE_OTHER): Admitting: Family Medicine

## 2023-06-24 ENCOUNTER — Encounter (INDEPENDENT_AMBULATORY_CARE_PROVIDER_SITE_OTHER): Payer: Self-pay | Admitting: Family Medicine

## 2023-06-24 VITALS — BP 121/87 | HR 96 | Temp 98.5°F | Ht 67.0 in | Wt 206.0 lb

## 2023-06-24 DIAGNOSIS — E88819 Insulin resistance, unspecified: Secondary | ICD-10-CM | POA: Diagnosis not present

## 2023-06-24 DIAGNOSIS — Z6831 Body mass index (BMI) 31.0-31.9, adult: Secondary | ICD-10-CM

## 2023-06-24 DIAGNOSIS — R7989 Other specified abnormal findings of blood chemistry: Secondary | ICD-10-CM | POA: Diagnosis not present

## 2023-06-24 DIAGNOSIS — E559 Vitamin D deficiency, unspecified: Secondary | ICD-10-CM

## 2023-06-24 DIAGNOSIS — E669 Obesity, unspecified: Secondary | ICD-10-CM

## 2023-06-24 MED ORDER — METFORMIN HCL 500 MG PO TABS: 500.0000 mg | ORAL_TABLET | Freq: Every day | ORAL | 0 refills | Status: DC

## 2023-06-24 MED ORDER — VITAMIN D (ERGOCALCIFEROL) 1.25 MG (50000 UNIT) PO CAPS
50000.0000 [IU] | ORAL_CAPSULE | ORAL | 0 refills | Status: DC
Start: 2023-06-24 — End: 2023-09-02

## 2023-06-24 NOTE — Progress Notes (Signed)
 Office: [phone]  /  Fax: [phone]  WEIGHT SUMMARY AND BIOMETRICS  Anthropometric Measurements Height: 5\' 7"  (1.702 m) Weight: 206 lb (93.4 kg) BMI (Calculated): 32.26 Weight at Last Visit: 202 lb Weight Lost Since Last Visit: 0 Weight Gained Since Last Visit: 4 lb Starting Weight: 200 lb Total Weight Loss (lbs): 0 lb (0 kg) Peak Weight: 206 lb   Body Composition  Body Fat %: 39.3 % Fat Mass (lbs): 81.2 lbs Muscle Mass (lbs): 118.8 lbs Total Body Water (lbs): 81.8 lbs Visceral Fat Rating : 9   Other Clinical Data Fasting: no Labs: no Today's Visit #: 27 Starting Date: 03/17/20    Chief Complaint: OBESITY   History of Present Illness Erika Garrett is a 43 year old female who presents for obesity treatment plan assessment and progress evaluation.  She has been adhering to a category two eating plan 90% of the time and engages in physical activity using the [employer] app for about 45 minutes, four times per week. Despite these efforts, she has gained four pounds in the last six weeks since her last visit. Her BMI is currently 32.26, and she aims to reduce it below 30. She has been experiencing gradual weight gain since last year.  She has a history of vitamin D  deficiency and is currently taking both prescription and over-the-counter vitamin D . Her most recent vitamin D  level, checked in March, was 40.5. She requests a refill today.  She has recently altered her morning routine by consuming a protein breakfast before coffee to manage cortisol levels, which she believes has helped reduce heartburn. She has increased her protein intake and is focusing on maintaining a balanced diet.  She mentions a past episode involving a salivary gland blockage, which required intervention by an oral Careers adviser. This incident led to a change in her pharmacy for medication management.  No current symptoms related to liver issues such as jaundice or abdominal pain.      PHYSICAL  EXAM:  Blood pressure 121/87, pulse 96, temperature 98.5 F (36.9 C), height 5\' 7"  (1.702 m), weight 206 lb (93.4 kg), last menstrual period 11/27/2019, SpO2 98%. Body mass index is 32.26 kg/m.  DIAGNOSTIC DATA REVIEWED:  BMET    Component Value Date/Time   NA 140 05/07/2023 1047   K 4.5 05/07/2023 1047   CL 103 05/07/2023 1047   CO2 19 (L) 05/07/2023 1047   GLUCOSE 96 05/07/2023 1047   GLUCOSE 97 11/14/2022 1025   BUN 16 05/07/2023 1047   CREATININE 0.75 05/07/2023 1047   CREATININE 0.64 11/20/2019 1201   CALCIUM 9.5 05/07/2023 1047   GFRNONAA 113 03/17/2020 0846   GFRNONAA 112 11/20/2019 1201   GFRAA 130 03/17/2020 0846   GFRAA 129 11/20/2019 1201   Lab Results  Component Value Date   HGBA1C 5.3 03/25/2023   HGBA1C 5.2 03/17/2020   Lab Results  Component Value Date   INSULIN  13.1 03/25/2023   INSULIN  6.4 03/17/2020   Lab Results  Component Value Date   TSH 2.300 07/31/2022   CBC    Component Value Date/Time   WBC 9.3 11/14/2022 1025   RBC 4.76 11/14/2022 1025   HGB 14.9 11/14/2022 1025   HCT 44.7 11/14/2022 1025   PLT 242.0 11/14/2022 1025   MCV 93.9 11/14/2022 1025   MCH 31.9 02/13/2022 0805   MCHC 33.2 11/14/2022 1025   RDW 12.7 11/14/2022 1025   Iron Studies No results found for: "IRON", "TIBC", "FERRITIN", "IRONPCTSAT" Lipid Panel  Component Value Date/Time   CHOL 215 (H) 03/25/2023 1336   TRIG 173 (H) 03/25/2023 1336   HDL 42 03/25/2023 1336   CHOLHDL 6 11/14/2022 1025   VLDL 33.6 11/14/2022 1025   LDLCALC 142 (H) 03/25/2023 1336   LDLCALC 138 (H) 11/20/2019 1201   LDLDIRECT 159.0 10/12/2021 0929   Hepatic Function Panel     Component Value Date/Time   PROT 6.8 05/07/2023 1047   ALBUMIN 4.6 05/07/2023 1047   AST 22 05/07/2023 1047   ALT 37 (H) 05/07/2023 1047   ALKPHOS 97 05/07/2023 1047   BILITOT 0.4 05/07/2023 1047   BILIDIR 0.1 12/08/2021 0751      Component Value Date/Time   TSH 2.300 07/31/2022 0910   Nutritional Lab  Results  Component Value Date   VD25OH 40.5 03/25/2023   VD25OH 31.3 07/31/2022   VD25OH 44.7 08/10/2021     Assessment and Plan Assessment & Plan Obesity Obesity with a BMI of 32.26. Despite adherence to a category two eating plan 90% of the time and exercising with the [facility] app four times a week, there has been a weight gain of four pounds over the last six weeks. She aims to reduce BMI below 30. Metformin was discussed as a non-hormonal option to aid in weight management by improving insulin  resistance and reducing cortisol effects. She expressed concerns about hormonal treatments and medication side effects. Metformin's potential side effects include gastrointestinal upset if taken on an empty stomach. It is non-addictive and does not cause diabetes. Shared decision-making involved considering metformin as an adjunct to lifestyle modifications. Metformin may also improve endometriosis symptoms by stabilizing insulin  and sex hormone levels. She is not close to diabetes, and metformin is considered a preventive measure. - Prescribe metformin - Continue category two eating plan - Continue exercise regimen with Pel[employer] app - Discuss non-scale victories and psychological benefits of lifestyle changes  Insulin  resistance Insulin  resistance with elevated insulin  levels but normal glucose levels. Metformin was discussed to improve insulin  sensitivity and reduce cortisol effects, contributing to central fat deposition. Metformin may aid in weight management and improve endometriosis symptoms by stabilizing insulin  and sex hormone levels. She is not close to diabetes, and metformin is considered a preventive measure. - Prescribe metformin - Monitor insulin  levels  Elevated liver function tests (LFTs) Elevated LFTs likely related to obesity and potential fatty liver disease. A gastroenterology consultation is scheduled to discuss liver health. No immediate concerns for liver failure or other  severe liver conditions. - Attend gastroenterology consultation and will follow up to discuss eating strategy after that visit  Vitamin D  deficiency Vitamin D  level is 40.5, with a goal of 50-60. She is on prescription and over-the-counter vitamin D  and requests a refill. - Refill prescription vitamin D      She was informed of the importance of frequent follow up visits to maximize her success with intensive lifestyle modifications for her multiple health conditions.    Jasmine Mesi, MD

## 2023-06-28 ENCOUNTER — Encounter: Payer: Self-pay | Admitting: Physician Assistant

## 2023-06-28 ENCOUNTER — Ambulatory Visit: Admitting: Physician Assistant

## 2023-06-28 NOTE — Progress Notes (Deleted)
 06/28/2023 Erika Garrett [id number] 1980-06-07  Referring provider: Jobe Mulder* Primary GI doctor: {[email or web address]:[id number]}  ASSESSMENT AND PLAN:    Patient Care Team: Jobe Mulder, DO as PCP - General (Family Medicine) Brian Campanile, MD (Inactive) as Consulting Physician (Neurology) Dr. Blase Bur (Dentistry) Pa, [facility] (Ophthalmology) Cindra Cree, MD as Consulting Physician (Ophthalmology) Orelia Binet, MD as Referring Physician (Obstetrics and Gynecology)  HISTORY OF PRESENT ILLNESS: 43 y.o. female with a past medical history listed below presents for evaluation of ***.   *** Discussed the use of AI scribe software for clinical note transcription with the patient, who gave verbal consent to proceed.  History of Present Illness            She  reports that she has quit smoking. Her smoking use included cigarettes. She has never used smokeless tobacco. She reports current alcohol use. She reports that she does not use drugs.  RELEVANT GI HISTORY, IMAGING AND LABS: Results          CBC    Component Value Date/Time   WBC 9.3 11/14/2022 1025   RBC 4.76 11/14/2022 1025   HGB 14.9 11/14/2022 1025   HCT 44.7 11/14/2022 1025   PLT 242.0 11/14/2022 1025   MCV 93.9 11/14/2022 1025   MCH 31.9 02/13/2022 0805   MCHC 33.2 11/14/2022 1025   RDW 12.7 11/14/2022 1025   LYMPHSABS 3,274 02/13/2022 0805   MONOABS 0.9 12/08/2021 0751   EOSABS 153 02/13/2022 0805   BASOSABS 31 02/13/2022 0805   Recent Labs    11/14/22 1025  HGB 14.9    CMP     Component Value Date/Time   NA 140 05/07/2023 1047   K 4.5 05/07/2023 1047   CL 103 05/07/2023 1047   CO2 19 (L) 05/07/2023 1047   GLUCOSE 96 05/07/2023 1047   GLUCOSE 97 11/14/2022 1025   BUN 16 05/07/2023 1047   CREATININE 0.75 05/07/2023 1047   CREATININE 0.64 11/20/2019 1201   CALCIUM 9.5 05/07/2023 1047   PROT 6.8 05/07/2023 1047   ALBUMIN 4.6 05/07/2023 1047   AST 22  05/07/2023 1047   ALT 37 (H) 05/07/2023 1047   ALKPHOS 97 05/07/2023 1047   BILITOT 0.4 05/07/2023 1047   GFRNONAA 113 03/17/2020 0846   GFRNONAA 112 11/20/2019 1201   GFRAA 130 03/17/2020 0846   GFRAA 129 11/20/2019 1201      Latest Ref Rng & Units 05/07/2023   10:47 AM 03/25/2023    1:36 PM 11/14/2022   10:25 AM  Hepatic Function  Total Protein 6.0 - 8.5 g/dL 6.8  6.2  6.6   Albumin 3.9 - 4.9 g/dL 4.6  4.4  4.5   AST 0 - 40 IU/L 22  37  23   ALT 0 - 32 IU/L 37  67  32   Alk Phosphatase 44 - 121 IU/L 97  95  86   Total Bilirubin 0.0 - 1.2 mg/dL 0.4  0.3  0.7       Current Medications:   Current Outpatient Medications (Endocrine & Metabolic):    metFORMIN (GLUCOPHAGE) 500 MG tablet, Take 1 tablet (500 mg total) by mouth daily with breakfast.   Current Outpatient Medications (Respiratory):    Cetirizine HCl (ZYRTEC PO), Take 1 tablet by mouth daily.    Current Outpatient Medications (Other):    MAGNESIUM PO, Take by mouth. Brand: Pure Encapsulate   Multiple Vitamins-Minerals (MULTIVITAMIN PO), Take 1 tablet by mouth daily.  Omega-3 Fatty Acids (FISH OIL) 1000 MG CAPS, Take by mouth.   Vitamin D , Cholecalciferol, 25 MCG (1000 UT) TABS, Take 2 tablets by mouth daily.   Vitamin D , Ergocalciferol , (DRISDOL ) 1.25 MG (50000 UNIT) CAPS capsule, Take 1 capsule (50,000 Units total) by mouth every 7 (seven) days.  Medical History:  Past Medical History:  Diagnosis Date   Allergy     Anxiety    Back pain    Bilateral swelling of feet    Constipation    Depression    Elevated liver enzymes    Endometriosis    Hyperlipidemia    hx of, no medications   Hypertension 2016   Intercranial Hypertension   Intracranial hypertension    Joint pain    Other fatigue    Pseudotumor cerebri induced by OCPs 05/22/2017   Shortness of breath on exertion    Subclinical hypothyroidism    Ventral hernia    Vitamin D  deficiency    Allergies:  Allergies  Allergen Reactions   Estrogens      BIH   Zithromax [Azithromycin] Hives   Doxycycline  Other (See Comments)    IIH hx, can raise ICP   Levonorgestrel-Ethinyl Estrad Other (See Comments)     Surgical History:  She  has a past surgical history that includes Robotic assisted laparoscopic partial cystectomy and Ablation on endometriosis. Family History:  Her family history includes Alcohol abuse in her father; Alcoholism in her father; Alzheimer's disease in her maternal grandmother; Cancer in her father, maternal grandmother, and mother; Colon polyps in her mother; Diabetes in her father; Endocrine tumor in her father; Healthy in her sister; Hemachromatosis in her paternal grandmother; Ovarian cancer in her mother; Pancreatic cancer in her father.  REVIEW OF SYSTEMS  : All other systems reviewed and negative except where noted in the History of Present Illness.  PHYSICAL EXAM: LMP 11/27/2019  Physical Exam          Edmonia Gottron, PA-C 8:11 AM

## 2023-07-05 DIAGNOSIS — M542 Cervicalgia: Secondary | ICD-10-CM | POA: Diagnosis not present

## 2023-07-05 DIAGNOSIS — M25519 Pain in unspecified shoulder: Secondary | ICD-10-CM | POA: Diagnosis not present

## 2023-07-19 DIAGNOSIS — M25519 Pain in unspecified shoulder: Secondary | ICD-10-CM | POA: Diagnosis not present

## 2023-07-19 DIAGNOSIS — M542 Cervicalgia: Secondary | ICD-10-CM | POA: Diagnosis not present

## 2023-07-31 DIAGNOSIS — M25519 Pain in unspecified shoulder: Secondary | ICD-10-CM | POA: Diagnosis not present

## 2023-07-31 DIAGNOSIS — M542 Cervicalgia: Secondary | ICD-10-CM | POA: Diagnosis not present

## 2023-08-01 ENCOUNTER — Other Ambulatory Visit (INDEPENDENT_AMBULATORY_CARE_PROVIDER_SITE_OTHER): Payer: Self-pay | Admitting: Family Medicine

## 2023-08-01 DIAGNOSIS — E88819 Insulin resistance, unspecified: Secondary | ICD-10-CM

## 2023-08-05 ENCOUNTER — Ambulatory Visit (INDEPENDENT_AMBULATORY_CARE_PROVIDER_SITE_OTHER): Admitting: Family Medicine

## 2023-08-08 ENCOUNTER — Other Ambulatory Visit (INDEPENDENT_AMBULATORY_CARE_PROVIDER_SITE_OTHER): Payer: Self-pay | Admitting: Family Medicine

## 2023-08-08 DIAGNOSIS — M25519 Pain in unspecified shoulder: Secondary | ICD-10-CM | POA: Diagnosis not present

## 2023-08-08 DIAGNOSIS — M542 Cervicalgia: Secondary | ICD-10-CM | POA: Diagnosis not present

## 2023-08-08 DIAGNOSIS — E88819 Insulin resistance, unspecified: Secondary | ICD-10-CM

## 2023-08-29 DIAGNOSIS — M25519 Pain in unspecified shoulder: Secondary | ICD-10-CM | POA: Diagnosis not present

## 2023-08-29 DIAGNOSIS — M542 Cervicalgia: Secondary | ICD-10-CM | POA: Diagnosis not present

## 2023-08-30 ENCOUNTER — Other Ambulatory Visit

## 2023-08-30 ENCOUNTER — Encounter: Payer: Self-pay | Admitting: Physician Assistant

## 2023-08-30 ENCOUNTER — Ambulatory Visit: Admitting: Physician Assistant

## 2023-08-30 VITALS — BP 118/80 | HR 84 | Ht 66.0 in | Wt 210.5 lb

## 2023-08-30 DIAGNOSIS — R1030 Lower abdominal pain, unspecified: Secondary | ICD-10-CM

## 2023-08-30 DIAGNOSIS — Z1159 Encounter for screening for other viral diseases: Secondary | ICD-10-CM

## 2023-08-30 DIAGNOSIS — K5900 Constipation, unspecified: Secondary | ICD-10-CM | POA: Diagnosis not present

## 2023-08-30 DIAGNOSIS — N80C0 Endometriosis of the abdomen, unspecified: Secondary | ICD-10-CM

## 2023-08-30 DIAGNOSIS — Z6833 Body mass index (BMI) 33.0-33.9, adult: Secondary | ICD-10-CM | POA: Diagnosis not present

## 2023-08-30 DIAGNOSIS — R140 Abdominal distension (gaseous): Secondary | ICD-10-CM

## 2023-08-30 DIAGNOSIS — R7989 Other specified abnormal findings of blood chemistry: Secondary | ICD-10-CM

## 2023-08-30 DIAGNOSIS — E669 Obesity, unspecified: Secondary | ICD-10-CM | POA: Diagnosis not present

## 2023-08-30 DIAGNOSIS — G8929 Other chronic pain: Secondary | ICD-10-CM | POA: Diagnosis not present

## 2023-08-30 LAB — BASIC METABOLIC PANEL WITH GFR
BUN: 19 mg/dL (ref 6–23)
CO2: 23 meq/L (ref 19–32)
Calcium: 9.4 mg/dL (ref 8.4–10.5)
Chloride: 104 meq/L (ref 96–112)
Creatinine, Ser: 0.59 mg/dL (ref 0.40–1.20)
GFR: 110.02 mL/min (ref >60.00)
Glucose, Bld: 115 mg/dL — ABNORMAL HIGH (ref 70–99)
Potassium: 4 meq/L (ref 3.5–5.1)
Sodium: 138 meq/L (ref 135–145)

## 2023-08-30 LAB — CBC WITH DIFFERENTIAL/PLATELET
Basophils Absolute: 0.1 K/uL (ref 0.0–0.1)
Basophils Relative: 0.6 % (ref 0.0–3.0)
Eosinophils Absolute: 0.1 K/uL (ref 0.0–0.7)
Eosinophils Relative: 1.3 % (ref 0.0–5.0)
HCT: 46.2 % — ABNORMAL HIGH (ref 36.0–46.0)
Hemoglobin: 15.8 g/dL — ABNORMAL HIGH (ref 12.0–15.0)
Lymphocytes Relative: 27.2 % (ref 12.0–46.0)
Lymphs Abs: 2.3 K/uL (ref 0.7–4.0)
MCHC: 34.1 g/dL (ref 30.0–36.0)
MCV: 91.4 fl (ref 78.0–100.0)
Monocytes Absolute: 0.5 K/uL (ref 0.1–1.0)
Monocytes Relative: 6.3 % (ref 3.0–12.0)
Neutro Abs: 5.4 K/uL (ref 1.4–7.7)
Neutrophils Relative %: 64.6 % (ref 43.0–77.0)
Platelets: 226 K/uL (ref 150.0–400.0)
RBC: 5.06 Mil/uL (ref 3.87–5.11)
RDW: 12.9 % (ref 11.5–15.5)
WBC: 8.4 K/uL (ref 4.0–10.5)

## 2023-08-30 LAB — IBC + FERRITIN
Ferritin: 107.1 ng/mL (ref 10.0–291.0)
Iron: 84 ug/dL (ref 42–145)
Saturation Ratios: 21.9 % (ref 20.0–50.0)
TIBC: 383.6 ug/dL (ref 250.0–450.0)
Transferrin: 274 mg/dL (ref 212.0–360.0)

## 2023-08-30 LAB — HEPATIC FUNCTION PANEL
ALT: 49 U/L — ABNORMAL HIGH (ref 0–35)
AST: 24 U/L (ref 0–37)
Albumin: 4.5 g/dL (ref 3.5–5.2)
Alkaline Phosphatase: 88 U/L (ref 39–117)
Bilirubin, Direct: 0.1 mg/dL (ref 0.0–0.3)
Total Bilirubin: 0.4 mg/dL (ref 0.2–1.2)
Total Protein: 7 g/dL (ref 6.0–8.3)

## 2023-08-30 LAB — MAGNESIUM: Magnesium: 2 mg/dL (ref 1.5–2.5)

## 2023-08-30 NOTE — Progress Notes (Signed)
 08/30/2023 Erika Garrett [id number] [birth year]  Referring provider: Frann Mabel Garrett* Primary GI doctor: Dr. Federico (Dr. Teressa)  ASSESSMENT AND PLAN:  Elevated liver function with history of focal fat MRI liver 2021 From chart review patient's had intermittent ALT elevation since 2020 02/2019 MRI liver with and without contrast for abnormal ultrasound with liver foci showed small hemangioma focal subcapsular fat in the left liver lobe adjacent to the porta hepatitis no suspicious liver masses, small hiatal hernia unremarkable pancreas normal gallbladder no ductal dilation No family history of liver cancer, paternal GM with hemachromatosis, rare ETOH, no tylenol  use, no history of blood transfusion/IV drug use 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  Must exclude other chronic causes of hepatocellular inflammation that can mimic fatty liver on ultrasound especially with family history of hematochromatosis, she is not on an iron    Latest Ref Rng & Units 05/07/2023   10:47 AM 03/25/2023    1:36 PM 11/14/2022   10:25 AM  Hepatic Function  Total Protein 6.0 - 8.5 g/dL 6.8  6.2  6.6   Albumin 3.9 - 4.9 g/dL 4.6  4.4  4.5   AST 0 - 40 IU/L 22  37  23   ALT 0 - 32 IU/L 37  67  32   Alk Phosphatase 44 - 121 IU/L 97  95  86   Total Bilirubin 0.0 - 1.2 mg/dL 0.4  0.3  0.7    Platelets 242.0  FIB 4 =0.64 based off labs in April and advanced fibrosis is excluded - Labs to include: hepatitis panel, iron, ferritin, TIBC,  IgG, ANA, Antismooth muscle antibody, AMA, celiac, thyroid , alpha-one-antitrypsin level -will get elastography, consider fibrosure - if stage 2-3 fibrosis can discuss Rezdiffra - need LFTs and CBC monitored every 6 months, - revaluation with imaging every 2-3 years.  -Continue to work on risk factor modification including diet exercise and control of risk factors including blood sugars. Continue follow up  with weight loss center.  Constipation with bloating chronic lower abdominal pain 2018 CTAP W for abdominal pain showed uterine fibroids otherwise unremarkable - Increase fiber/ water intake, decrease caffeine, increase activity level.  History of severe endometriosis Requiring exploratory laparotomy in early 2000's S/p hysterectomy in 2022 with improvement of her pain  Screening colonoscopy 02/25/2019 with Dr. Teressa for abdominal pain and constipation good bowel prep normal TI no specimens collected recall 10 years  Obesity  Body mass index is 33.98 kg/m.  - continue with healthy weight loss center  Patient Care Team: Erika Mabel Mt, DO as PCP - General (Family Medicine) Erika Carlin POUR, MD (Inactive) as Consulting Physician (Neurology) Dr. Paris (Dentistry) Pa, Erika Garrett (Ophthalmology) Erika Cain, MD as Consulting Physician (Ophthalmology) Erika Sharleen DEL, MD as Referring Physician (Obstetrics and Gynecology)  HISTORY OF PRESENT ILLNESS: 43 y.o. female with a past medical history listed below presents for evaluation of elevated LFTs.   Discussed the use of AI scribe software for clinical note transcription with the patient, who gave verbal consent to proceed.  History of Present Illness   Erika Garrett is a 43 year old female who presents with evaluation of liver function abnormalities. She was referred by her primary care doctor due to fluctuating liver function tests.  She reports that her liver function tests have fluctuated since at least 2020, with some results showing normal levels and others indicating significant abnormalities. She reports no symptoms related to liver dysfunction and  states she never had liver problems prior to moving to her current location in 2018. In 2021, an MRI and ultrasound conducted during an evaluation for endometriosis revealed a small hemangioma and focal fat on her liver. She was told that her pancreas and  gallbladder appeared normal on prior imaging. She has not had any imaging of her liver since 2021.  Since moving from [place] in 2018, she has gained 50 pounds. Despite efforts to manage her weight through healthy eating and exercise, she has been unable to lose weight, which she finds frustrating. She has been involved in a weight management program but has not seen results, leading to feelings of discouragement. She was recently prescribed metformin  for weight management but has not started it due to concerns about medication side effects.  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 She also has endometriosis and adenomyosis, which were addressed surgically in 2022 with a hysterectomy and appendectomy, resolving her chronic pain issues. She has not experienced any pain since her surgery.  Her paternal grandmother had hemochromatosis, but there is no known family history of liver or autoimmune issues. She drinks alcohol rarely due to swelling and inflammation, and she has a history of high iron levels but avoids iron supplements. She has no history of blood transfusions, jaundice, or significant gastrointestinal symptoms. No jaundice, significant itching, gastrointestinal bleeding, nausea, vomiting, shortness of breath, chest discomfort, or significant swelling. She reports congestion earlier in the week.      She  reports that she quit smoking about 2 years ago. Her smoking use included cigarettes. She has never used smokeless tobacco. She reports current alcohol use. She reports that she does not use drugs.  RELEVANT GI HISTORY, IMAGING AND LABS: Results   LABS Random insulin : Normal  RADIOLOGY Upper abdominal MRI: Small hemangioma on liver, focal hepatic steatosis, normal pancreas and gallbladder (2021) Lower abdominal MRI: Bladder and colon adhered to uterus (2021)      CBC     Component Value Date/Time   WBC 9.3 11/14/2022 1025   RBC 4.76 11/14/2022 1025   HGB 14.9 11/14/2022 1025   HCT 44.7 11/14/2022 1025   PLT 242.0 11/14/2022 1025   MCV 93.9 11/14/2022 1025   MCH 31.9 02/13/2022 0805   MCHC 33.2 11/14/2022 1025   RDW 12.7 11/14/2022 1025   LYMPHSABS 3,274 02/13/2022 0805   MONOABS 0.9 12/08/2021 0751   EOSABS 153 02/13/2022 0805   BASOSABS 31 02/13/2022 0805   Recent Labs    11/14/22 1025  HGB 14.9    CMP     Component Value Date/Time   NA 140 05/07/2023 1047   K 4.5 05/07/2023 1047   CL 103 05/07/2023 1047   CO2 19 (L) 05/07/2023 1047   GLUCOSE 96 05/07/2023 1047   GLUCOSE 97 11/14/2022 1025   BUN 16 05/07/2023 1047   CREATININE 0.75 05/07/2023 1047   CREATININE 0.64 11/20/2019 1201   CALCIUM 9.5 05/07/2023 1047   PROT 6.8 05/07/2023 1047   ALBUMIN 4.6 05/07/2023 1047   AST 22 05/07/2023 1047   ALT 37 (H) 05/07/2023 1047   ALKPHOS 97 05/07/2023 1047   BILITOT 0.4 05/07/2023 1047   GFRNONAA 113 03/17/2020 0846   GFRNONAA 112 11/20/2019 1201   GFRAA 130 03/17/2020 0846   GFRAA 129 11/20/2019 1201      Latest Ref Rng & Units 05/07/2023   10:47 AM 03/25/2023  1:36 PM 11/14/2022   10:25 AM  Hepatic Function  Total Protein 6.0 - 8.5 g/dL 6.8  6.2  6.6   Albumin 3.9 - 4.9 g/dL 4.6  4.4  4.5   AST 0 - 40 IU/L 22  37  23   ALT 0 - 32 IU/L 37  67  32   Alk Phosphatase 44 - 121 IU/L 97  95  86   Total Bilirubin 0.0 - 1.2 mg/dL 0.4  0.3  0.7       Current Medications:   Current Outpatient Medications (Endocrine & Metabolic):    metFORMIN  (GLUCOPHAGE ) 500 MG tablet, Take 1 tablet (500 mg total) by mouth daily with breakfast. (Patient not taking: Reported on 08/30/2023)   Current Outpatient Medications (Respiratory):    Cetirizine HCl (ZYRTEC PO), Take 1 tablet by mouth daily. Alternating with Allegra   fexofenadine (ALLEGRA) 180 MG tablet, Take 180 mg by mouth daily. Alternating with cetirizine    Current Outpatient  Medications (Other):    MAGNESIUM PO, Take 2 tablets by mouth at bedtime. Brand: Pure Encapsulate   Multiple Vitamins-Minerals (MULTIVITAMIN PO), Take 1 tablet by mouth daily.   Omega-3 Fatty Acids (FISH OIL) 1000 MG CAPS, Take by mouth.   Vitamin D , Ergocalciferol , (DRISDOL ) 1.25 MG (50000 UNIT) CAPS capsule, Take 1 capsule (50,000 Units total) by mouth every 7 (seven) days.  Medical History:  Past Medical History:  Diagnosis Date   Allergy     Anxiety    Back pain    Bilateral swelling of feet    Constipation    Depression    Elevated liver enzymes    Endometriosis    Hyperlipidemia    hx of, no medications   Hypertension 2016   Intercranial Hypertension   Intracranial hypertension    Joint pain    Other fatigue    Pseudotumor cerebri induced by OCPs 05/22/2017   Shortness of breath on exertion    Subclinical hypothyroidism    Ventral hernia    Vitamin D  deficiency    Allergies:  Allergies  Allergen Reactions   Estrogens     BIH   Zithromax [Azithromycin] Hives   Doxycycline  Other (See Comments)    IIH hx, can raise ICP   Levonorgestrel-Ethinyl Estrad Other (See Comments)   Other     All birth control pills     Surgical History:  She  has a past surgical history that includes Robotic assisted laparoscopic partial cystectomy; Ablation on endometriosis; and Partial hysterectomy. Family History:  Her family history includes Alcohol abuse in her father; Alzheimer's disease in her maternal grandmother; Breast cancer in her maternal grandmother; Colon polyps in her mother; Diabetes in her father; Endocrine tumor in her father; Healthy in her sister; Hemachromatosis in her paternal grandmother; Mental illness in her paternal grandmother; Ovarian cancer in her mother; Pancreatic cancer in her father.  REVIEW OF SYSTEMS  : All other systems reviewed and negative except where noted in the History of Present Illness.  PHYSICAL EXAM: BP 118/80 (BP Location: Left Arm, Patient  Position: Sitting, Cuff Size: Large)   Pulse 84   Ht 5' 6 (1.676 m) Comment: height measured without shoes  Wt 210 lb 8 oz (95.5 kg)   LMP 11/27/2019   BMI 33.98 kg/m  Physical Exam   GENERAL APPEARANCE: Well nourished, in no apparent distress. HEENT: No cervical lymphadenopathy, unremarkable thyroid , sclerae anicteric, conjunctiva pink. RESPIRATORY: Respiratory effort normal, breath sounds equal bilaterally without rales, rhonchi, or wheezing. Lungs clear to  auscultation bilaterally. CARDIO: Regular rate and rhythm with no murmurs, rubs, or gallops, peripheral pulses intact. ABDOMEN: Soft, non-distended, active bowel sounds in all four quadrants, no tenderness to palpation, no rebound, no mass appreciated. RECTAL: Declines. MUSCULOSKELETAL: Full range of motion, normal gait, without edema. SKIN: Dry, intact without rashes or lesions. No jaundice. NEURO: Alert, oriented, no focal deficits. PSYCH: Cooperative, normal mood and affect.      Alan JONELLE Coombs, PA-C 11:45 AM

## 2023-08-30 NOTE — Progress Notes (Signed)
 I agree with the assessment and plan as outlined by Ms. Steffanie Dunn.

## 2023-08-30 NOTE — Patient Instructions (Signed)
 Your provider has requested that you go to the basement level for lab work before leaving today. Press B on the elevator. The lab is located at the first door on the left as you exit the elevator.   You have been scheduled for an abdominal ultrasound/Elastography at [facility] (1st floor of hospital) on 09/05/2023 at 9:30 am . Please arrive 30 minutes prior to your appointment for registration. Make certain not to have anything to eat or drink 6 hours prior to your appointment. Should you need to reschedule your appointment, please contact radiology at [phone]. This test typically takes about 30 minutes to perform.  Metabolic dysfunction associated seatohepatitis  Now the leading cause of liver failure in the [place] .  It is normally from such risk factors as obesity, diabetes, insulin  resistance, high cholesterol, or metabolic syndrome.  The only definitive therapy is weight loss and exercise.   Suggest walking 20-30 mins daily.  Decreasing carbohydrates, increasing veggies.    Fatty Liver Fatty liver is the accumulation of fat in liver cells. It is also called hepatosteatosis or steatohepatitis. It is normal for your liver to contain some fat. If fat is more than 5 to 10% of your liver's weight, you have fatty liver.  There are often no symptoms (problems) for years while damage is still occurring. People often learn about their fatty liver when they have medical tests for other reasons. Fat can damage your liver for years or even decades without causing problems. When it becomes severe, it can cause fatigue, weight loss, weakness, and confusion. This makes you more likely to develop more serious liver problems. The liver is the largest organ in the body. It does a lot of work and often gives no warning signs when it is sick until late in a disease. The liver has many important jobs including: Breaking down foods. Storing vitamins, iron, and other minerals. Making  proteins. Making bile for food digestion. Breaking down many products including medications, alcohol and some poisons.  PROGNOSIS  Fatty liver may cause no damage or it can lead to an inflammation of the liver. This is, called steatohepatitis.  Over time the liver may become scarred and hardened. This condition is called cirrhosis. Cirrhosis is serious and may lead to liver failure or cancer. NASH is one of the leading causes of cirrhosis. About 10-20% of Americans have fatty liver and a smaller 2-5% has NASH.  TREATMENT  Weight loss, fat restriction, and exercise in overweight patients produces inconsistent results but is worth trying. Good control of diabetes may reduce fatty liver. Eat a balanced, healthy diet. Increase your physical activity. There are no medical or surgical treatments for a fatty liver or NASH, but improving your diet and increasing your exercise may help prevent or reverse some of the damage.  Due to recent changes in healthcare laws, you may see the results of your imaging and laboratory studies on MyChart before your provider has had a chance to review them.  We understand that in some cases there may be results that are confusing or concerning to you. Not all laboratory results come back in the same time frame and the provider may be waiting for multiple results in order to interpret others.  Please give us  48 hours in order for your provider to thoroughly review all the results before contacting the office for clarification of your results. '  I appreciate the  opportunity to care for you  Thank You   [facility]

## 2023-09-02 ENCOUNTER — Encounter (INDEPENDENT_AMBULATORY_CARE_PROVIDER_SITE_OTHER): Payer: Self-pay | Admitting: Family Medicine

## 2023-09-02 ENCOUNTER — Ambulatory Visit (INDEPENDENT_AMBULATORY_CARE_PROVIDER_SITE_OTHER): Admitting: Family Medicine

## 2023-09-02 VITALS — BP 123/88 | HR 57 | Temp 98.0°F | Ht 67.0 in | Wt 205.0 lb

## 2023-09-02 DIAGNOSIS — Z6832 Body mass index (BMI) 32.0-32.9, adult: Secondary | ICD-10-CM | POA: Diagnosis not present

## 2023-09-02 DIAGNOSIS — E559 Vitamin D deficiency, unspecified: Secondary | ICD-10-CM | POA: Diagnosis not present

## 2023-09-02 DIAGNOSIS — E669 Obesity, unspecified: Secondary | ICD-10-CM | POA: Diagnosis not present

## 2023-09-02 DIAGNOSIS — E88819 Insulin resistance, unspecified: Secondary | ICD-10-CM

## 2023-09-02 DIAGNOSIS — E66811 Obesity, class 1: Secondary | ICD-10-CM | POA: Insufficient documentation

## 2023-09-02 DIAGNOSIS — Z6833 Body mass index (BMI) 33.0-33.9, adult: Secondary | ICD-10-CM

## 2023-09-02 DIAGNOSIS — Z6831 Body mass index (BMI) 31.0-31.9, adult: Secondary | ICD-10-CM | POA: Insufficient documentation

## 2023-09-02 DIAGNOSIS — R7989 Other specified abnormal findings of blood chemistry: Secondary | ICD-10-CM

## 2023-09-02 MED ORDER — VITAMIN D (ERGOCALCIFEROL) 1.25 MG (50000 UNIT) PO CAPS: 50000.0000 [IU] | ORAL_CAPSULE | ORAL | 0 refills | Status: AC

## 2023-09-02 NOTE — Progress Notes (Signed)
 Office: [phone]  /  Fax: [phone]  WEIGHT SUMMARY AND BIOMETRICS  Anthropometric Measurements Height: 5' 7 (1.702 m) Weight: 205 lb (93 kg) BMI (Calculated): 32.1 Weight at Last Visit: 206 lb Weight Lost Since Last Visit: 1 lb Weight Gained Since Last Visit: 0 Starting Weight: 200 lb Total Weight Loss (lbs): 0 lb (0 kg) Peak Weight: 206 lb   Body Composition  Body Fat %: 38.9 % Fat Mass (lbs): 79.8 lbs Muscle Mass (lbs): 119 lbs Total Body Water (lbs): 80.8 lbs Visceral Fat Rating : 8   Other Clinical Data Fasting: yes Labs: no Today's Visit #: 28 Starting Date: 03/17/20    Chief Complaint: OBESITY   History of Present Illness Erika Garrett is a 43 year old female who presents for obesity treatment and progress assessment.  She has been following a category two eating plan approximately 80% of the time and exercises four days a week, combining cardio and strengthening exercises for 30 to 45 minutes. Despite these efforts, she has only lost one pound over the past two months, leading to some frustration.  She is currently being treated for vitamin D  deficiency with prescription vitamin D . Her most recent vitamin D  level was 40.5 in March, and she has requested a refill of her prescription.  She has a history of insulin  resistance and is prescribed metformin , but has not been taking it recently due to travel and illness. She has the medication at home but has not resumed it yet.  For the past two weeks, she has been experiencing symptoms resembling a cold, including a persistent cough. She has been taking over-the-counter medications such as Zyrtec, Allegra, Benadryl , Sudafed PE, Mucinex  DM, and Delsym. Her symptoms are improving but have not completely resolved. She has tested negative for COVID-19.  She recently visited a gastroenterologist and underwent several blood tests, which showed slightly elevated hemoglobin and hematocrit levels, and low IgG serum.  She attributes these results to not fasting before the tests and taking cold medications. She is scheduled for an ultrasound and a fibroscan to assess her liver condition.  Her family history includes her grandmother having hemochromatosis and her father being diagnosed with a rare pancreatic tumor at age 28, discovered during a routine GI scan.      PHYSICAL EXAM:  Blood pressure 123/88, pulse (!) 57, temperature 98 F (36.7 C), height 5' 7 (1.702 m), weight 205 lb (93 kg), last menstrual period 11/27/2019, SpO2 98%. Body mass index is 32.11 kg/m.  DIAGNOSTIC DATA REVIEWED:  BMET    Component Value Date/Time   NA 138 08/30/2023 1153   NA 140 05/07/2023 1047   K 4.0 08/30/2023 1153   CL 104 08/30/2023 1153   CO2 23 08/30/2023 1153   GLUCOSE 115 (H) 08/30/2023 1153   BUN 19 08/30/2023 1153   BUN 16 05/07/2023 1047   CREATININE 0.59 08/30/2023 1153   CREATININE 0.64 11/20/2019 1201   CALCIUM 9.4 08/30/2023 1153   GFRNONAA 113 03/17/2020 0846   GFRNONAA 112 11/20/2019 1201   GFRAA 130 03/17/2020 0846   GFRAA 129 11/20/2019 1201   Lab Results  Component Value Date   HGBA1C 5.3 03/25/2023   HGBA1C 5.2 03/17/2020   Lab Results  Component Value Date   INSULIN  13.1 03/25/2023   INSULIN  6.4 03/17/2020   Lab Results  Component Value Date   TSH 2.300 07/31/2022   CBC    Component Value Date/Time   WBC 8.4 08/30/2023 1153   RBC 5.06 08/30/2023  1153   HGB 15.8 (H) 08/30/2023 1153   HCT 46.2 (H) 08/30/2023 1153   PLT 226.0 08/30/2023 1153   MCV 91.4 08/30/2023 1153   MCH 31.9 02/13/2022 0805   MCHC 34.1 08/30/2023 1153   RDW 12.9 08/30/2023 1153   Iron Studies    Component Value Date/Time   IRON 84 08/30/2023 1153   TIBC 383.6 08/30/2023 1153   FERRITIN 107.1 08/30/2023 1153   IRONPCTSAT 21.9 08/30/2023 1153   Lipid Panel     Component Value Date/Time   CHOL 215 (H) 03/25/2023 1336   TRIG 173 (H) 03/25/2023 1336   HDL 42 03/25/2023 1336   CHOLHDL 6  11/14/2022 1025   VLDL 33.6 11/14/2022 1025   LDLCALC 142 (H) 03/25/2023 1336   LDLCALC 138 (H) 11/20/2019 1201   LDLDIRECT 159.0 10/12/2021 0929   Hepatic Function Panel     Component Value Date/Time   PROT 7.0 08/30/2023 1153   PROT 6.8 05/07/2023 1047   ALBUMIN 4.5 08/30/2023 1153   ALBUMIN 4.6 05/07/2023 1047   AST 24 08/30/2023 1153   ALT 49 (H) 08/30/2023 1153   ALKPHOS 88 08/30/2023 1153   BILITOT 0.4 08/30/2023 1153   BILITOT 0.4 05/07/2023 1047   BILIDIR 0.1 08/30/2023 1153      Component Value Date/Time   TSH 2.300 07/31/2022 0910   Nutritional Lab Results  Component Value Date   VD25OH 40.5 03/25/2023   VD25OH 31.3 07/31/2022   VD25OH 44.7 08/10/2021     Assessment and Plan Assessment & Plan Obesity Obesity management with category two eating plan and exercise regimen. Reports adherence to the eating plan about 80% of the time and exercises four days a week for 30-45 minutes, combining cardio and strengthening exercises. Weight loss of one pound in the last two months, with an increase in muscle mass and a decrease in visceral fat. Emphasizes the benefits of muscle gain, which burns more calories and aids in weight loss. - Continue category two eating plan. - Continue exercise regimen four days a week. - Schedule follow-up appointments in September and October.  Vitamin D  deficiency Vitamin D  deficiency managed with prescription vitamin D . Recent vitamin D  level was 40.5 in March. - Refill prescription vitamin D .  Insulin  resistance Insulin  resistance previously managed with metformin , which she has not been taking recently due to travel and illness. Reassured that metformin  can be resumed at any time and does not affect other treatments. - Resume metformin  as needed.  Elevated LFT Elevated liver function tests with one liver enzyme slightly elevated. Differential diagnosis includes fatty liver versus scarring liver. Scheduled for a fibroscan to assess  liver elasticity and potential fibrosis. Explained the fibroscan as a non-invasive ultrasound that evaluates liver stretchiness and potential scarring. Discussed that the fibroscan is expected to become standard of care due to its accuracy in assessing specific liver conditions. - Complete fibroscan and elasticity test at the hospital.    She was informed of the importance of frequent follow up visits to maximize her success with intensive lifestyle modifications for her multiple health conditions.    Louann Penton, MD

## 2023-09-04 ENCOUNTER — Ambulatory Visit: Payer: Self-pay | Admitting: Physician Assistant

## 2023-09-04 DIAGNOSIS — R7989 Other specified abnormal findings of blood chemistry: Secondary | ICD-10-CM

## 2023-09-05 ENCOUNTER — Ambulatory Visit (HOSPITAL_COMMUNITY)
Admission: RE | Admit: 2023-09-05 | Discharge: 2023-09-05 | Disposition: A | Discharge status: Home / Self Care | Source: Ambulatory Visit | Attending: Physician Assistant | Admitting: Physician Assistant

## 2023-09-05 DIAGNOSIS — R7989 Other specified abnormal findings of blood chemistry: Secondary | ICD-10-CM | POA: Insufficient documentation

## 2023-09-05 LAB — CERULOPLASMIN: Ceruloplasmin: 30 mg/dL (ref 14–48)

## 2023-09-05 LAB — TISSUE TRANSGLUTAMINASE, IGA: (tTG) Ab, IgA: <1 U/mL

## 2023-09-05 LAB — ANA: Anti Nuclear Antibody (ANA): NEGATIVE

## 2023-09-05 LAB — HEPATITIS A ANTIBODY, TOTAL: Hepatitis A AB,Total: NONREACTIVE

## 2023-09-05 LAB — MITOCHONDRIAL ANTIBODIES: Mitochondrial M2 Ab, IgG: 66.6 U — ABNORMAL HIGH (ref <=20.0)

## 2023-09-05 LAB — ALPHA-1-ANTITRYPSIN: A-1 Antitrypsin, Ser: 150 mg/dL (ref 83–199)

## 2023-09-05 LAB — IGA: Immunoglobulin A: 235 mg/dL (ref 47–310)

## 2023-09-05 LAB — HEPATITIS B SURFACE ANTIBODY,QUALITATIVE: Hep B S Ab: NONREACTIVE

## 2023-09-05 LAB — IGG: IgG (Immunoglobin G), Serum: 572 mg/dL — ABNORMAL LOW (ref 600–1640)

## 2023-09-05 LAB — HEPATITIS C ANTIBODY: Hepatitis C Ab: NONREACTIVE

## 2023-09-05 LAB — HEPATITIS B SURFACE ANTIGEN: Hepatitis B Surface Ag: NONREACTIVE

## 2023-09-05 LAB — ANTI-SMOOTH MUSCLE ANTIBODY, IGG: Actin (Smooth Muscle) Antibody (IGG): <20 U (ref <20)

## 2023-09-06 NOTE — Telephone Encounter (Signed)
 Long discussion with the patient, Has had elevated LFT for at least 5 years predominantly ALT.negative IgG, ANA patient did have significantly elevated antimitochondrial antibody at 65.  Pending abdominal ultrasound. Discussed with Dr. Federico and she did agree that liver biopsy would likely be next step. I called the patient to discuss this and she would prefer to recheck labs and wait on ultrasound which I think is reasonable at this time. Patient states she was in [place]  on a trip 8 days before she was not drinking much alcohol but was not eating how she normally does and did have a cold was on several cold medications. I think with the length of LFT elevations and labs uncertain if healthy diet will change to this but we will plan on repeating LFTs, AMA in 4 weeks. If abdominal ultrasound is abnormal may suggest liver biopsy or MRCP.

## 2023-09-10 DIAGNOSIS — M542 Cervicalgia: Secondary | ICD-10-CM | POA: Diagnosis not present

## 2023-09-10 DIAGNOSIS — M25519 Pain in unspecified shoulder: Secondary | ICD-10-CM | POA: Diagnosis not present

## 2023-09-12 ENCOUNTER — Ambulatory Visit (INDEPENDENT_AMBULATORY_CARE_PROVIDER_SITE_OTHER): Admitting: Family Medicine

## 2023-09-26 DIAGNOSIS — M542 Cervicalgia: Secondary | ICD-10-CM | POA: Diagnosis not present

## 2023-09-26 DIAGNOSIS — M25519 Pain in unspecified shoulder: Secondary | ICD-10-CM | POA: Diagnosis not present

## 2023-10-02 ENCOUNTER — Ambulatory Visit (INDEPENDENT_AMBULATORY_CARE_PROVIDER_SITE_OTHER): Admitting: Family Medicine

## 2023-10-02 ENCOUNTER — Encounter (INDEPENDENT_AMBULATORY_CARE_PROVIDER_SITE_OTHER): Payer: Self-pay | Admitting: Family Medicine

## 2023-10-02 VITALS — BP 108/73 | HR 68 | Temp 97.7°F | Ht 67.0 in | Wt 206.0 lb

## 2023-10-02 DIAGNOSIS — E66811 Obesity, class 1: Secondary | ICD-10-CM | POA: Diagnosis not present

## 2023-10-02 DIAGNOSIS — R760 Raised antibody titer: Secondary | ICD-10-CM

## 2023-10-02 DIAGNOSIS — Z6832 Body mass index (BMI) 32.0-32.9, adult: Secondary | ICD-10-CM | POA: Diagnosis not present

## 2023-10-02 DIAGNOSIS — E88819 Insulin resistance, unspecified: Secondary | ICD-10-CM | POA: Diagnosis not present

## 2023-10-02 MED ORDER — METFORMIN HCL 500 MG PO TABS: 500.0000 mg | ORAL_TABLET | Freq: Every day | ORAL | 0 refills | Status: DC

## 2023-10-02 NOTE — Progress Notes (Signed)
 Office: [phone]  /  Fax: [phone]  WEIGHT SUMMARY AND BIOMETRICS  Anthropometric Measurements Height: 5' 7 (1.702 m) Weight: 206 lb (93.4 kg) BMI (Calculated): 32.26 Weight at Last Visit: 205 lb Weight Lost Since Last Visit: 0 Weight Gained Since Last Visit: 1 lb Starting Weight: 200 lb Total Weight Loss (lbs): 0 lb (0 kg) Peak Weight: 206 lb   Body Composition  Body Fat %: 39.1 % Fat Mass (lbs): 80.8 lbs Muscle Mass (lbs): 119.6 lbs Total Body Water (lbs): 82.4 lbs Visceral Fat Rating : 9   Other Clinical Data Fasting: no Labs: no Today's Visit #: 29 Starting Date: 03/17/20    Chief Complaint: OBESITY    History of Present Illness Britani Beattie is a 43 year old female with insulin  resistance who presents for special EDC treatment and progress assessment.  She has been following a category two eating plan 85% of the time and exercises for 30 minutes, five days a week. Despite these efforts, she has gained one pound in the last month. She is concerned about not achieving desired weight loss despite significant lifestyle changes, including stopping sweets for the past three to four weeks.  Her insulin  levels were measured at 9 and 13. She has not been on metformin , opting instead to focus on diet and exercise.  She is concerned about a potential liver issue. In August, she was informed by a nurse practitioner that she might have primary biliary cholangitis (PBC) due to elevated mitochondrial antibodies, although her ultrasound and elastography results were normal. She is scheduled to have her blood retested five weeks after the initial test, as she was sick during the first test, which might have affected the results. She has no symptoms of liver disease and is apprehensive about undergoing a liver biopsy without conclusive evidence.  She has a history of endometriosis, which she notes could be related to autoimmune issues. She has no family history of  autoimmune diseases.      PHYSICAL EXAM:  Blood pressure 108/73, pulse 68, temperature 97.7 F (36.5 C), height 5' 7 (1.702 m), weight 206 lb (93.4 kg), last menstrual period 11/27/2019, SpO2 97%. Body mass index is 32.26 kg/m.  DIAGNOSTIC DATA REVIEWED:  BMET    Component Value Date/Time   NA 138 08/30/2023 1153   NA 140 05/07/2023 1047   K 4.0 08/30/2023 1153   CL 104 08/30/2023 1153   CO2 23 08/30/2023 1153   GLUCOSE 115 (H) 08/30/2023 1153   BUN 19 08/30/2023 1153   BUN 16 05/07/2023 1047   CREATININE 0.59 08/30/2023 1153   CREATININE 0.64 11/20/2019 1201   CALCIUM 9.4 08/30/2023 1153   GFRNONAA 113 03/17/2020 0846   GFRNONAA 112 11/20/2019 1201   GFRAA 130 03/17/2020 0846   GFRAA 129 11/20/2019 1201   Lab Results  Component Value Date   HGBA1C 5.3 03/25/2023   HGBA1C 5.2 03/17/2020   Lab Results  Component Value Date   INSULIN  13.1 03/25/2023   INSULIN  6.4 03/17/2020   Lab Results  Component Value Date   TSH 2.300 07/31/2022   CBC    Component Value Date/Time   WBC 8.4 08/30/2023 1153   RBC 5.06 08/30/2023 1153   HGB 15.8 (H) 08/30/2023 1153   HCT 46.2 (H) 08/30/2023 1153   PLT 226.0 08/30/2023 1153   MCV 91.4 08/30/2023 1153   MCH 31.9 02/13/2022 0805   MCHC 34.1 08/30/2023 1153   RDW 12.9 08/30/2023 1153   Iron Studies  Component Value Date/Time   IRON 84 08/30/2023 1153   TIBC 383.6 08/30/2023 1153   FERRITIN 107.1 08/30/2023 1153   IRONPCTSAT 21.9 08/30/2023 1153   Lipid Panel     Component Value Date/Time   CHOL 215 (H) 03/25/2023 1336   TRIG 173 (H) 03/25/2023 1336   HDL 42 03/25/2023 1336   CHOLHDL 6 11/14/2022 1025   VLDL 33.6 11/14/2022 1025   LDLCALC 142 (H) 03/25/2023 1336   LDLCALC 138 (H) 11/20/2019 1201   LDLDIRECT 159.0 10/12/2021 0929   Hepatic Function Panel     Component Value Date/Time   PROT 7.0 08/30/2023 1153   PROT 6.8 05/07/2023 1047   ALBUMIN 4.5 08/30/2023 1153   ALBUMIN 4.6 05/07/2023 1047   AST  24 08/30/2023 1153   ALT 49 (H) 08/30/2023 1153   ALKPHOS 88 08/30/2023 1153   BILITOT 0.4 08/30/2023 1153   BILITOT 0.4 05/07/2023 1047   BILIDIR 0.1 08/30/2023 1153      Component Value Date/Time   TSH 2.300 07/31/2022 0910   Nutritional Lab Results  Component Value Date   VD25OH 40.5 03/25/2023   VD25OH 31.3 07/31/2022   VD25OH 44.7 08/10/2021     Assessment and Plan Assessment & Plan Obesity, class 1 Following the category two eating plan 85% of the time and exercising 30 minutes, five days a week. Despite these efforts, she has gained one pound in the last month. Reports feeling better and less bloated, with an increase in muscle mass as indicated by the scale. Metabolism was previously lower than expected, contributing to difficulty in losing weight. Body may have adapted to previous weight loss efforts, making it more resistant to weight loss now. - Reevaluate metabolism with a fasting metabolism test at the next visit. - Maintain a very strict food journal to track macros and caloric intake. - Aim for a caloric intake of 1200-1300 calories per day with at least 90 grams of protein. - Schedule a fasting metabolism test 30 minutes before the next appointment. - Consider a sleep evaluation if metabolism remains low.  Insulin  resistance Insulin  resistance indicated by fasting insulin  levels of 9 and 13, above the normal range. Insulin  resistance is a genetic predisposition and cannot be completely eliminated, but it can be controlled. Managing condition through diet and exercise, but not taking metformin , which was previously discussed as an option. Metformin  could help address underlying issues related to insulin  resistance, but she expresses reluctance to rely on medication. - Consider starting metformin  to help control insulin  resistance. - Continue current diet and exercise regimen.  Potential primary biliary cholangitis (PBC) Concerned about potential diagnosis of PBC due  to elevated mitochondrial antibodies. Hesitant to undergo a liver biopsy and prefers to seek a second opinion from a hepatologist. Supports decision to get a second opinion and suggests watchful waiting as an option, given the lack of symptoms and normal ultrasound and elastography results. Discussed risks of liver biopsy, including infection and bleeding. - Obtain a second opinion from a hepatologist regarding potential PBC diagnosis. - Repeat blood tests to reassess mitochondrial antibody levels. - Consider watchful waiting if further tests remain inconclusive.     Lake was informed of the importance of frequent follow up visits to maximize her success with intensive lifestyle modifications for her obesity and obesity related health conditions as recommended by USPSTF and CMS guidelines   Louann Penton, MD

## 2023-10-04 ENCOUNTER — Ambulatory Visit: Payer: Self-pay | Admitting: Physician Assistant

## 2023-10-04 ENCOUNTER — Other Ambulatory Visit (INDEPENDENT_AMBULATORY_CARE_PROVIDER_SITE_OTHER)

## 2023-10-04 DIAGNOSIS — R7989 Other specified abnormal findings of blood chemistry: Secondary | ICD-10-CM

## 2023-10-04 LAB — HEPATIC FUNCTION PANEL
ALT: 29 U/L (ref 0–35)
AST: 21 U/L (ref 0–37)
Albumin: 4.4 g/dL (ref 3.5–5.2)
Alkaline Phosphatase: 82 U/L (ref 39–117)
Bilirubin, Direct: 0.1 mg/dL (ref 0.0–0.3)
Total Bilirubin: 0.5 mg/dL (ref 0.2–1.2)
Total Protein: 6.8 g/dL (ref 6.0–8.3)

## 2023-10-09 DIAGNOSIS — M25519 Pain in unspecified shoulder: Secondary | ICD-10-CM | POA: Diagnosis not present

## 2023-10-09 DIAGNOSIS — M542 Cervicalgia: Secondary | ICD-10-CM | POA: Diagnosis not present

## 2023-10-09 LAB — MITOCHONDRIAL ANTIBODIES: Mitochondrial M2 Ab, IgG: 74.4 U — ABNORMAL HIGH (ref <=20.0)

## 2023-11-04 ENCOUNTER — Ambulatory Visit (INDEPENDENT_AMBULATORY_CARE_PROVIDER_SITE_OTHER): Admitting: Family Medicine

## 2023-11-04 ENCOUNTER — Encounter (INDEPENDENT_AMBULATORY_CARE_PROVIDER_SITE_OTHER): Payer: Self-pay | Admitting: Family Medicine

## 2023-11-04 VITALS — BP 121/80 | HR 65 | Temp 98.1°F | Ht 67.0 in | Wt 203.0 lb

## 2023-11-04 DIAGNOSIS — Z8261 Family history of arthritis: Secondary | ICD-10-CM

## 2023-11-04 DIAGNOSIS — E669 Obesity, unspecified: Secondary | ICD-10-CM

## 2023-11-04 DIAGNOSIS — E559 Vitamin D deficiency, unspecified: Secondary | ICD-10-CM

## 2023-11-04 DIAGNOSIS — Z6831 Body mass index (BMI) 31.0-31.9, adult: Secondary | ICD-10-CM

## 2023-11-04 DIAGNOSIS — E88819 Insulin resistance, unspecified: Secondary | ICD-10-CM

## 2023-11-04 DIAGNOSIS — R760 Raised antibody titer: Secondary | ICD-10-CM

## 2023-11-04 DIAGNOSIS — R0602 Shortness of breath: Secondary | ICD-10-CM

## 2023-11-04 NOTE — Progress Notes (Signed)
 Office: [phone]  /  Fax: [phone]  WEIGHT SUMMARY AND BIOMETRICS  Anthropometric Measurements Height: 5' 7 (1.702 m) Weight: 203 lb (92.1 kg) BMI (Calculated): 31.79 Weight at Last Visit: 206lb Weight Lost Since Last Visit: 3lb Weight Gained Since Last Visit: 0lb Starting Weight: 200lb Total Weight Loss (lbs): 0 lb (0 kg) Peak Weight: 206lb   Body Composition  Body Fat %: 38.1 % Fat Mass (lbs): 77.6 lbs Muscle Mass (lbs): 119.6 lbs Total Body Water (lbs): 78 lbs Visceral Fat Rating : 8   Other Clinical Data RMR: [id number] Fasting: Yes Labs: yes Today's Visit #: 30 Starting Date: 03/17/20    Chief Complaint: OBESITY    History of Present Illness Hanako Tipping is a 43 year old female with obesity who presents for a follow-up on her weight management treatment.  She adheres to a category two eating plan approximately 85% of the time and engages in cardio and strength exercises four days a week for 30 minutes each session. Over the past month, she has lost three pounds. She has experienced shortness of breath with exercise in the past and is focusing on weight loss and exercise to alleviate this symptom.  She has vitamin D  deficiency and insulin  resistance. She is not taking metformin  but is using vitamins, including vitamin D , which she obtains from the pharmacy. She is due for lab work today to monitor these conditions.  There is a family history of autoimmune conditions, including rheumatoid arthritis, affecting a great aunt, a second cousin, and an aunt. She has been tested for various conditions, including hepatitis, lupus, MS, and hemochromatosis, with negative results. She also has a history of intracranial hypertension and endometriosis.      PHYSICAL EXAM:  Blood pressure 121/80, pulse 65, temperature 98.1 F (36.7 C), height 5' 7 (1.702 m), weight 203 lb (92.1 kg), last menstrual period 11/27/2019, SpO2 97%. Body mass index is 31.79  kg/m.  DIAGNOSTIC DATA REVIEWED:  BMET    Component Value Date/Time   NA 138 08/30/2023 1153   NA 140 05/07/2023 1047   K 4.0 08/30/2023 1153   CL 104 08/30/2023 1153   CO2 23 08/30/2023 1153   GLUCOSE 115 (H) 08/30/2023 1153   BUN 19 08/30/2023 1153   BUN 16 05/07/2023 1047   CREATININE 0.59 08/30/2023 1153   CREATININE 0.64 11/20/2019 1201   CALCIUM 9.4 08/30/2023 1153   GFRNONAA 113 03/17/2020 0846   GFRNONAA 112 11/20/2019 1201   GFRAA 130 03/17/2020 0846   GFRAA 129 11/20/2019 1201   Lab Results  Component Value Date   HGBA1C 5.3 03/25/2023   HGBA1C 5.2 03/17/2020   Lab Results  Component Value Date   INSULIN  13.1 03/25/2023   INSULIN  6.4 03/17/2020   Lab Results  Component Value Date   TSH 2.300 07/31/2022   CBC    Component Value Date/Time   WBC 8.4 08/30/2023 1153   RBC 5.06 08/30/2023 1153   HGB 15.8 (H) 08/30/2023 1153   HCT 46.2 (H) 08/30/2023 1153   PLT 226.0 08/30/2023 1153   MCV 91.4 08/30/2023 1153   MCH 31.9 02/13/2022 0805   MCHC 34.1 08/30/2023 1153   RDW 12.9 08/30/2023 1153   Iron Studies    Component Value Date/Time   IRON 84 08/30/2023 1153   TIBC 383.6 08/30/2023 1153   FERRITIN 107.1 08/30/2023 1153   IRONPCTSAT 21.9 08/30/2023 1153   Lipid Panel     Component Value Date/Time   CHOL 215 (H) 03/25/2023 1336  TRIG 173 (H) 03/25/2023 1336   HDL 42 03/25/2023 1336   CHOLHDL 6 11/14/2022 1025   VLDL 33.6 11/14/2022 1025   LDLCALC 142 (H) 03/25/2023 1336   LDLCALC 138 (H) 11/20/2019 1201   LDLDIRECT 159.0 10/12/2021 0929   Hepatic Function Panel     Component Value Date/Time   PROT 6.8 10/04/2023 0859   PROT 6.8 05/07/2023 1047   ALBUMIN 4.4 10/04/2023 0859   ALBUMIN 4.6 05/07/2023 1047   AST 21 10/04/2023 0859   ALT 29 10/04/2023 0859   ALKPHOS 82 10/04/2023 0859   BILITOT 0.5 10/04/2023 0859   BILITOT 0.4 05/07/2023 1047   BILIDIR 0.1 10/04/2023 0859      Component Value Date/Time   TSH 2.300 07/31/2022 0910    Nutritional Lab Results  Component Value Date   VD25OH 40.5 03/25/2023   VD25OH 31.3 07/31/2022   VD25OH 44.7 08/10/2021     Assessment and Plan Assessment & Plan Obesity with insulin  resistance and shortness of breath with exercise Obesity with associated insulin  resistance and exertional dyspnea. She adheres to a category two eating plan 85% of the time and exercises four days weekly, resulting in a three-pound weight loss over the past month. Indirect calorimetry indicates an improved resting metabolic rate from 1400 to over 1600, though still suboptimal. Nutrient intake and carbohydrate management are crucial to prevent excessive insulin  production and fat storage. - Continue category two eating plan - Continue exercise regimen - Order labs to assess insulin  resistance - Monitor weight and metabolic rate  Vitamin D  deficiency Vitamin D  deficiency is managed with supplementation, and she reports adherence to the regimen. - Continue vitamin D  supplementation - Order labs to assess vitamin D  levels  Elevated mitochondrial antibodies with monitoring for autoimmune disease Elevated mitochondrial antibodies without current autoimmune disease diagnosis. Previous liver panel and ultrasound were normal. Discussed potential autoimmune disease development, family history of rheumatoid arthritis, and her 1 in 10 chance of developing an autoimmune disease in the next five years. Negative tests for hepatitis, lupus, and MS. Discussed limitations of rheumatoid factor testing and potential need for rheumatology evaluation if positive. - Order rheumatoid factor test - Refer to rheumatology if rheumatoid factor test is positive - Monitor for symptoms of autoimmune disease    Guiselle was informed of the importance of frequent follow up visits to maximize her success with intensive lifestyle modifications for her obesity and obesity related health conditions as recommended by USPSTF and CMS  guidelines   Louann Penton, MD

## 2023-11-05 ENCOUNTER — Encounter (INDEPENDENT_AMBULATORY_CARE_PROVIDER_SITE_OTHER): Payer: Self-pay | Admitting: Family Medicine

## 2023-11-05 LAB — CMP14+EGFR
ALT: 52 IU/L — ABNORMAL HIGH (ref 0–32)
AST: 28 IU/L (ref 0–40)
Albumin: 4.7 g/dL (ref 3.9–4.9)
Alkaline Phosphatase: 109 IU/L (ref 41–116)
BUN/Creatinine Ratio: 23 (ref 9–23)
BUN: 16 mg/dL (ref 6–24)
Bilirubin Total: 0.4 mg/dL (ref 0.0–1.2)
CO2: 19 mmol/L — ABNORMAL LOW (ref 20–29)
Calcium: 9.6 mg/dL (ref 8.7–10.2)
Chloride: 101 mmol/L (ref 96–106)
Creatinine, Ser: 0.69 mg/dL (ref 0.57–1.00)
Globulin, Total: 2.1 g/dL (ref 1.5–4.5)
Glucose: 96 mg/dL (ref 70–99)
Potassium: 4.3 mmol/L (ref 3.5–5.2)
Sodium: 139 mmol/L (ref 134–144)
Total Protein: 6.8 g/dL (ref 6.0–8.5)
eGFR: 110 mL/min/1.73 (ref >59)

## 2023-11-05 LAB — HEMOGLOBIN A1C
Est. average glucose Bld gHb Est-mCnc: 108 mg/dL
Hgb A1c MFr Bld: 5.4 % (ref 4.8–5.6)

## 2023-11-05 LAB — LIPID PANEL WITH LDL/HDL RATIO
Cholesterol, Total: 240 mg/dL — ABNORMAL HIGH (ref 100–199)
HDL: 36 mg/dL — ABNORMAL LOW (ref >39)
LDL Chol Calc (NIH): 147 mg/dL — ABNORMAL HIGH (ref 0–99)
LDL/HDL Ratio: 4.1 ratio — ABNORMAL HIGH (ref 0.0–3.2)
Triglycerides: 309 mg/dL — ABNORMAL HIGH (ref 0–149)
VLDL Cholesterol Cal: 57 mg/dL — ABNORMAL HIGH (ref 5–40)

## 2023-11-05 LAB — VITAMIN B12: Vitamin B-12: 854 pg/mL (ref 232–1245)

## 2023-11-05 LAB — RHEUMATOID FACTOR: Rheumatoid fact SerPl-aCnc: 11 [IU]/mL (ref <14.0)

## 2023-11-05 LAB — VITAMIN D 25 HYDROXY (VIT D DEFICIENCY, FRACTURES): Vit D, 25-Hydroxy: 41.3 ng/mL (ref 30.0–100.0)

## 2023-11-05 LAB — INSULIN, RANDOM: INSULIN: 15.2 u[IU]/mL (ref 2.6–24.9)

## 2023-11-05 NOTE — Telephone Encounter (Signed)
**Note De-identified  [place] Obfuscation** Please advise 

## 2023-11-25 DIAGNOSIS — M25519 Pain in unspecified shoulder: Secondary | ICD-10-CM | POA: Diagnosis not present

## 2023-11-25 DIAGNOSIS — M542 Cervicalgia: Secondary | ICD-10-CM | POA: Diagnosis not present

## 2023-11-27 DIAGNOSIS — D2262 Melanocytic nevi of left upper limb, including shoulder: Secondary | ICD-10-CM | POA: Diagnosis not present

## 2023-11-27 DIAGNOSIS — L821 Other seborrheic keratosis: Secondary | ICD-10-CM | POA: Diagnosis not present

## 2023-11-27 DIAGNOSIS — D2272 Melanocytic nevi of left lower limb, including hip: Secondary | ICD-10-CM | POA: Diagnosis not present

## 2023-11-27 DIAGNOSIS — L720 Epidermal cyst: Secondary | ICD-10-CM | POA: Diagnosis not present

## 2023-11-27 DIAGNOSIS — D2271 Melanocytic nevi of right lower limb, including hip: Secondary | ICD-10-CM | POA: Diagnosis not present

## 2023-11-27 DIAGNOSIS — L918 Other hypertrophic disorders of the skin: Secondary | ICD-10-CM | POA: Diagnosis not present

## 2023-11-27 DIAGNOSIS — Z129 Encounter for screening for malignant neoplasm, site unspecified: Secondary | ICD-10-CM | POA: Diagnosis not present

## 2023-11-27 DIAGNOSIS — D2261 Melanocytic nevi of right upper limb, including shoulder: Secondary | ICD-10-CM | POA: Diagnosis not present

## 2023-11-27 DIAGNOSIS — D225 Melanocytic nevi of trunk: Secondary | ICD-10-CM | POA: Diagnosis not present

## 2023-12-02 ENCOUNTER — Encounter: Payer: 59 | Admitting: Family Medicine

## 2023-12-09 ENCOUNTER — Encounter: Admitting: Family Medicine

## 2023-12-10 DIAGNOSIS — M25519 Pain in unspecified shoulder: Secondary | ICD-10-CM | POA: Diagnosis not present

## 2023-12-10 DIAGNOSIS — M542 Cervicalgia: Secondary | ICD-10-CM | POA: Diagnosis not present

## 2023-12-21 ENCOUNTER — Other Ambulatory Visit (INDEPENDENT_AMBULATORY_CARE_PROVIDER_SITE_OTHER): Payer: Self-pay | Admitting: Family Medicine

## 2023-12-21 DIAGNOSIS — E559 Vitamin D deficiency, unspecified: Secondary | ICD-10-CM

## 2023-12-26 DIAGNOSIS — M25519 Pain in unspecified shoulder: Secondary | ICD-10-CM | POA: Diagnosis not present

## 2023-12-26 DIAGNOSIS — M542 Cervicalgia: Secondary | ICD-10-CM | POA: Diagnosis not present

## 2023-12-30 ENCOUNTER — Ambulatory Visit (INDEPENDENT_AMBULATORY_CARE_PROVIDER_SITE_OTHER): Payer: Self-pay | Admitting: Family Medicine

## 2024-01-06 ENCOUNTER — Ambulatory Visit (INDEPENDENT_AMBULATORY_CARE_PROVIDER_SITE_OTHER): Admitting: Family Medicine

## 2024-01-06 ENCOUNTER — Ambulatory Visit: Payer: Self-pay | Admitting: Family Medicine

## 2024-01-06 ENCOUNTER — Encounter: Payer: Self-pay | Admitting: Family Medicine

## 2024-01-06 VITALS — BP 122/76 | HR 78 | Temp 98.0°F | Resp 16 | Ht 67.0 in | Wt 209.4 lb

## 2024-01-06 DIAGNOSIS — Z0000 Encounter for general adult medical examination without abnormal findings: Secondary | ICD-10-CM | POA: Diagnosis not present

## 2024-01-06 LAB — LIPID PANEL
Cholesterol: 203 mg/dL — ABNORMAL HIGH (ref 0–200)
HDL: 42.2 mg/dL (ref >39.00)
LDL Cholesterol: 124 mg/dL — ABNORMAL HIGH (ref 0–99)
NonHDL: 161.24
Total CHOL/HDL Ratio: 5
Triglycerides: 188 mg/dL — ABNORMAL HIGH (ref 0.0–149.0)
VLDL: 37.6 mg/dL (ref 0.0–40.0)

## 2024-01-06 LAB — CBC
HCT: 43.3 % (ref 36.0–46.0)
Hemoglobin: 14.9 g/dL (ref 12.0–15.0)
MCHC: 34.4 g/dL (ref 30.0–36.0)
MCV: 89.3 fl (ref 78.0–100.0)
Platelets: 236 K/uL (ref 150.0–400.0)
RBC: 4.85 Mil/uL (ref 3.87–5.11)
RDW: 12.8 % (ref 11.5–15.5)
WBC: 7.7 K/uL (ref 4.0–10.5)

## 2024-01-06 LAB — COMPREHENSIVE METABOLIC PANEL WITH GFR
ALT: 41 U/L — ABNORMAL HIGH (ref 0–35)
AST: 26 U/L (ref 0–37)
Albumin: 4.6 g/dL (ref 3.5–5.2)
Alkaline Phosphatase: 85 U/L (ref 39–117)
BUN: 16 mg/dL (ref 6–23)
CO2: 28 meq/L (ref 19–32)
Calcium: 9.2 mg/dL (ref 8.4–10.5)
Chloride: 103 meq/L (ref 96–112)
Creatinine, Ser: 0.65 mg/dL (ref 0.40–1.20)
GFR: 107.22 mL/min (ref >60.00)
Glucose, Bld: 96 mg/dL (ref 70–99)
Potassium: 4.4 meq/L (ref 3.5–5.1)
Sodium: 137 meq/L (ref 135–145)
Total Bilirubin: 0.5 mg/dL (ref 0.2–1.2)
Total Protein: 6.6 g/dL (ref 6.0–8.3)

## 2024-01-06 NOTE — Patient Instructions (Addendum)
 Give [name] 2-3 business days to get the results of your labs back.   Keep the diet clean and stay active.  Please get me a copy of your advanced directive form at your convenience.   Please consider counseling. Contact [phone] to schedule an appointment or inquire about cost/insurance coverage.  [facility] located at [place], [place], [place].  Phone number = [phone].  Dr. Lennice Sites - Adult Psychiatry.    [facility] located at [place], [place], [place]. Phone number = [phone].   [facility] located at [address], [place], [place].  Phone number = [phone].   [facility] located at [place], [place], [place].  Phone number = [phone].  Let us know if you need anything.

## 2024-01-06 NOTE — Progress Notes (Signed)
 Chief Complaint  Patient presents with   Annual Exam    CPE     Well Woman Erika Garrett is here for a complete physical.   Her last physical was >1 year ago.  Current diet: in general, a healthy diet. Current exercise: walking, wt lifting class. Weight is stable and she denies fatigue out of ordinary. Seatbelt? Yes Advanced directive? No- has the form  Health Maintenance Pap/HPV- Yes Mammogram- Yes Tetanus- Yes Hep C screening- Yes HIV screening- Yes  Past Medical History:  Diagnosis Date   Allergy     Anxiety    Back pain    Bilateral swelling of feet    Constipation    Depression    Elevated liver enzymes    Endometriosis    Hyperlipidemia    hx of, no medications   Hypertension 2016   Intercranial Hypertension   Intracranial hypertension    Joint pain    Other fatigue    Pseudotumor cerebri induced by OCPs 05/22/2017   Shortness of breath on exertion    Subclinical hypothyroidism    Ventral hernia    Vitamin D  deficiency      Past Surgical History:  Procedure Laterality Date   ABLATION ON ENDOMETRIOSIS     PARTIAL HYSTERECTOMY     with appendectomy   ROBOTIC ASSISTED LAPAROSCOPIC PARTIAL CYSTECTOMY      Medications  Medications Ordered Prior to Encounter[1]   Allergies Allergies[2]  Review of Systems: Constitutional:  no unexpected weight changes Eye:  no recent significant change in vision Ear/Nose/Mouth/Throat:  Ears:  no recent change in hearing Nose/Mouth/Throat:  no complaints of nasal congestion, no sore throat Cardiovascular: no chest pain Respiratory:  no shortness of breath Gastrointestinal:  no abdominal pain, no change in bowel habits GU:  Female: negative for dysuria or pelvic pain Musculoskeletal/Extremities:  no pain of the joints Integumentary (Skin/Breast):  no abnormal skin lesions reported Neurologic:  no headaches Endocrine:  denies fatigue Hematologic/Lymphatic:  No areas of easy bleeding  Exam BP 122/76 (BP Location:  Left Arm, Patient Position: Sitting)   Pulse 78   Temp 98 F (36.7 C) (Oral)   Resp 16   Ht 5' 7 (1.702 m)   Wt 209 lb 6.4 oz (95 kg)   LMP 11/27/2019   SpO2 97%   BMI 32.80 kg/m  General:  well developed, well nourished, in no apparent distress Skin:  no significant moles, warts, or growths Head:  no masses, lesions, or tenderness Eyes:  pupils equal and round, sclera anicteric without injection Ears:  canals without lesions, TMs shiny without retraction, no obvious effusion, no erythema Nose:  nares patent, mucosa normal, and no drainage Throat/Pharynx:  lips and gingiva without lesion; tongue and uvula midline; non-inflamed pharynx; no exudates or postnasal drainage Neck: neck supple without adenopathy, thyromegaly, or masses Lungs:  clear to auscultation, breath sounds equal bilaterally, no respiratory distress Cardio:  regular rate and rhythm, no LE edema Abdomen:  abdomen soft, nontender; bowel sounds normal; no masses or organomegaly Genital: Defer to GYN Musculoskeletal:  symmetrical muscle groups noted without atrophy or deformity Extremities:  no clubbing, cyanosis, or edema, no deformities, no skin discoloration Neuro:  gait normal; deep tendon reflexes normal and symmetric Psych: well oriented with normal range of affect and appropriate judgment/insight  Assessment and Plan  Well adult exam - Plan: Lipid panel, Comprehensive metabolic panel with GFR, CBC, Hepatitis B surface antibody,quantitative   Well 43 y.o. female. Counseled on diet and exercise. Advanced directive form  requested today.  Screen Hep B. Other orders as above. Follow up in 1 yr. The patient voiced understanding and agreement to the plan.  Erika [place], DO 01/06/2024 9:40 AM     [1]  Current Outpatient Medications on File Prior to Visit  Medication Sig Dispense Refill   Cetirizine HCl (ZYRTEC PO) Take 1 tablet by mouth daily. Alternating with Allegra     fexofenadine (ALLEGRA)  180 MG tablet Take 180 mg by mouth daily. Alternating with cetirizine     MAGNESIUM PO Take 2 tablets by mouth at bedtime. Brand: Pure Encapsulate     Multiple Vitamins-Minerals (MULTIVITAMIN PO) Take 1 tablet by mouth daily.     Omega-3 Fatty Acids (FISH OIL) 1000 MG CAPS Take by mouth.     Vitamin D , Ergocalciferol , (DRISDOL ) 1.25 MG (50000 UNIT) CAPS capsule Take 1 capsule (50,000 Units total) by mouth every 7 (seven) days. 12 capsule 0   No current facility-administered medications on file prior to visit.  [2]  Allergies Allergen Reactions   Estrogens     BIH   Zithromax [Azithromycin] Hives   Doxycycline  Other (See Comments)    IIH hx, can raise ICP   Levonorgestrel-Ethinyl Estrad Other (See Comments)   Other     All birth control pills

## 2024-01-07 LAB — HEPATITIS B SURFACE ANTIBODY, QUANTITATIVE: Hep B S AB Quant (Post): 7 m[IU]/mL — ABNORMAL LOW (ref >=10)

## 2024-02-12 ENCOUNTER — Ambulatory Visit (INDEPENDENT_AMBULATORY_CARE_PROVIDER_SITE_OTHER): Payer: Self-pay | Admitting: Family Medicine

## 2024-03-18 ENCOUNTER — Ambulatory Visit (INDEPENDENT_AMBULATORY_CARE_PROVIDER_SITE_OTHER): Payer: Self-pay | Admitting: Family Medicine

## 2025-01-06 ENCOUNTER — Encounter: Admitting: Family Medicine
